# Patient Record
Sex: Female | Born: 1945 | Race: White | Hispanic: No | Marital: Married | Smoking: Former smoker
Health system: Southern US, Community
[De-identification: ages and names within clinical notes are randomized; demographics above are authoritative.]

## PROBLEM LIST (undated history)

## (undated) DIAGNOSIS — I739 Peripheral vascular disease, unspecified: Secondary | ICD-10-CM

## (undated) DIAGNOSIS — K635 Polyp of colon: Secondary | ICD-10-CM

## (undated) DIAGNOSIS — N816 Rectocele: Secondary | ICD-10-CM

## (undated) DIAGNOSIS — I700 Atherosclerosis of aorta: Secondary | ICD-10-CM

## (undated) DIAGNOSIS — M1990 Unspecified osteoarthritis, unspecified site: Secondary | ICD-10-CM

## (undated) DIAGNOSIS — G5763 Lesion of plantar nerve, bilateral lower limbs: Secondary | ICD-10-CM

## (undated) DIAGNOSIS — B029 Zoster without complications: Secondary | ICD-10-CM

## (undated) DIAGNOSIS — I10 Essential (primary) hypertension: Secondary | ICD-10-CM

## (undated) DIAGNOSIS — E039 Hypothyroidism, unspecified: Secondary | ICD-10-CM

## (undated) DIAGNOSIS — C439 Malignant melanoma of skin, unspecified: Secondary | ICD-10-CM

## (undated) DIAGNOSIS — F419 Anxiety disorder, unspecified: Secondary | ICD-10-CM

## (undated) DIAGNOSIS — F32A Depression, unspecified: Secondary | ICD-10-CM

## (undated) DIAGNOSIS — E785 Hyperlipidemia, unspecified: Secondary | ICD-10-CM

## (undated) DIAGNOSIS — G43909 Migraine, unspecified, not intractable, without status migrainosus: Secondary | ICD-10-CM

## (undated) DIAGNOSIS — R7302 Impaired glucose tolerance (oral): Secondary | ICD-10-CM

## (undated) DIAGNOSIS — E7800 Pure hypercholesterolemia, unspecified: Secondary | ICD-10-CM

## (undated) DIAGNOSIS — S72009A Fracture of unspecified part of neck of unspecified femur, initial encounter for closed fracture: Secondary | ICD-10-CM

## (undated) DIAGNOSIS — D126 Benign neoplasm of colon, unspecified: Secondary | ICD-10-CM

## (undated) DIAGNOSIS — I639 Cerebral infarction, unspecified: Secondary | ICD-10-CM

## (undated) HISTORY — PX: COLONOSCOPY: SHX174

## (undated) HISTORY — DX: Lesion of plantar nerve, bilateral lower limbs: G57.63

## (undated) HISTORY — DX: Rectocele: N81.6

## (undated) HISTORY — DX: Anxiety disorder, unspecified: F41.9

## (undated) HISTORY — PX: CATARACT EXTRACTION, BILATERAL: SHX1313

## (undated) HISTORY — DX: Migraine, unspecified, not intractable, without status migrainosus: G43.909

## (undated) HISTORY — PX: BREAST SURGERY: SHX581

## (undated) HISTORY — PX: BREAST BIOPSY: SHX20

## (undated) HISTORY — DX: Depression, unspecified: F32.A

## (undated) HISTORY — DX: Fracture of unspecified part of neck of unspecified femur, initial encounter for closed fracture: S72.009A

## (undated) HISTORY — DX: Polyp of colon: K63.5

## (undated) HISTORY — PX: PARTIAL HYSTERECTOMY: SHX80

## (undated) HISTORY — PX: ABDOMINAL HYSTERECTOMY: SHX81

## (undated) HISTORY — DX: Benign neoplasm of colon, unspecified: D12.6

## (undated) HISTORY — DX: Hyperlipidemia, unspecified: E78.5

## (undated) HISTORY — DX: Unspecified osteoarthritis, unspecified site: M19.90

## (undated) HISTORY — PX: REPLACEMENT TOTAL KNEE: SUR1224

## (undated) HISTORY — DX: Atherosclerosis of aorta: I70.0

## (undated) HISTORY — DX: Zoster without complications: B02.9

## (undated) HISTORY — DX: Impaired glucose tolerance (oral): R73.02

## (undated) HISTORY — DX: Malignant melanoma of skin, unspecified: C43.9

## (undated) HISTORY — PX: OTHER SURGICAL HISTORY: SHX169

## (undated) HISTORY — PX: THYROIDECTOMY: SHX17

## (undated) HISTORY — DX: Peripheral vascular disease, unspecified: I73.9

## (undated) HISTORY — DX: Hypothyroidism, unspecified: E03.9

---

## 2004-02-05 DIAGNOSIS — I639 Cerebral infarction, unspecified: Secondary | ICD-10-CM

## 2004-02-05 HISTORY — DX: Cerebral infarction, unspecified: I63.9

## 2005-09-04 DIAGNOSIS — I639 Cerebral infarction, unspecified: Secondary | ICD-10-CM | POA: Insufficient documentation

## 2011-11-19 DIAGNOSIS — M1710 Unilateral primary osteoarthritis, unspecified knee: Secondary | ICD-10-CM | POA: Insufficient documentation

## 2011-11-19 DIAGNOSIS — E782 Mixed hyperlipidemia: Secondary | ICD-10-CM | POA: Insufficient documentation

## 2011-11-19 DIAGNOSIS — I639 Cerebral infarction, unspecified: Secondary | ICD-10-CM | POA: Insufficient documentation

## 2011-11-19 DIAGNOSIS — E890 Postprocedural hypothyroidism: Secondary | ICD-10-CM | POA: Insufficient documentation

## 2012-01-13 DIAGNOSIS — Z833 Family history of diabetes mellitus: Secondary | ICD-10-CM | POA: Insufficient documentation

## 2012-01-13 DIAGNOSIS — G609 Hereditary and idiopathic neuropathy, unspecified: Secondary | ICD-10-CM | POA: Insufficient documentation

## 2012-03-27 DIAGNOSIS — M5430 Sciatica, unspecified side: Secondary | ICD-10-CM | POA: Insufficient documentation

## 2013-12-29 DIAGNOSIS — Z8673 Personal history of transient ischemic attack (TIA), and cerebral infarction without residual deficits: Secondary | ICD-10-CM | POA: Insufficient documentation

## 2013-12-29 DIAGNOSIS — R7301 Impaired fasting glucose: Secondary | ICD-10-CM | POA: Insufficient documentation

## 2014-05-18 DIAGNOSIS — N816 Rectocele: Secondary | ICD-10-CM | POA: Insufficient documentation

## 2014-05-18 DIAGNOSIS — N952 Postmenopausal atrophic vaginitis: Secondary | ICD-10-CM | POA: Insufficient documentation

## 2015-11-22 ENCOUNTER — Encounter: Payer: Self-pay | Admitting: Cardiovascular Disease

## 2016-01-31 DIAGNOSIS — R202 Paresthesia of skin: Secondary | ICD-10-CM | POA: Insufficient documentation

## 2016-01-31 DIAGNOSIS — G5600 Carpal tunnel syndrome, unspecified upper limb: Secondary | ICD-10-CM | POA: Insufficient documentation

## 2016-11-02 ENCOUNTER — Emergency Department
Admission: EM | Admit: 2016-11-02 | Discharge: 2016-11-02 | Disposition: A | Discharge status: Home / Self Care | Payer: Medicare Other | Attending: Emergency Medicine | Admitting: Emergency Medicine

## 2016-11-02 ENCOUNTER — Encounter: Payer: Self-pay | Admitting: Emergency Medicine

## 2016-11-02 DIAGNOSIS — S61210A Laceration without foreign body of right index finger without damage to nail, initial encounter: Secondary | ICD-10-CM | POA: Diagnosis not present

## 2016-11-02 DIAGNOSIS — Y92019 Unspecified place in single-family (private) house as the place of occurrence of the external cause: Secondary | ICD-10-CM | POA: Insufficient documentation

## 2016-11-02 DIAGNOSIS — Y999 Unspecified external cause status: Secondary | ICD-10-CM | POA: Diagnosis not present

## 2016-11-02 DIAGNOSIS — S61212A Laceration without foreign body of right middle finger without damage to nail, initial encounter: Secondary | ICD-10-CM

## 2016-11-02 DIAGNOSIS — S61214A Laceration without foreign body of right ring finger without damage to nail, initial encounter: Secondary | ICD-10-CM | POA: Diagnosis not present

## 2016-11-02 DIAGNOSIS — Z7902 Long term (current) use of antithrombotics/antiplatelets: Secondary | ICD-10-CM | POA: Insufficient documentation

## 2016-11-02 DIAGNOSIS — Y9389 Activity, other specified: Secondary | ICD-10-CM | POA: Diagnosis not present

## 2016-11-02 DIAGNOSIS — I10 Essential (primary) hypertension: Secondary | ICD-10-CM | POA: Diagnosis not present

## 2016-11-02 DIAGNOSIS — S61222A Laceration with foreign body of right middle finger without damage to nail, initial encounter: Secondary | ICD-10-CM | POA: Diagnosis not present

## 2016-11-02 DIAGNOSIS — W25XXXA Contact with sharp glass, initial encounter: Secondary | ICD-10-CM | POA: Diagnosis not present

## 2016-11-02 DIAGNOSIS — Z8673 Personal history of transient ischemic attack (TIA), and cerebral infarction without residual deficits: Secondary | ICD-10-CM | POA: Insufficient documentation

## 2016-11-02 HISTORY — DX: Cerebral infarction, unspecified: I63.9

## 2016-11-02 HISTORY — DX: Essential (primary) hypertension: I10

## 2016-11-02 HISTORY — DX: Pure hypercholesterolemia, unspecified: E78.00

## 2016-11-02 MED ORDER — LIDOCAINE HCL (PF) 1 % IJ SOLN
5.0000 mL | Freq: Once | INTRAMUSCULAR | Status: AC
  Administered 2016-11-02: 5 mL

## 2016-11-02 MED ORDER — LIDOCAINE HCL (PF) 1 % IJ SOLN
INTRAMUSCULAR | Status: AC
  Administered 2016-11-02: 5 mL
  Filled 2016-11-02: qty 5

## 2016-11-02 NOTE — ED Provider Notes (Signed)
[facility] Emergency Department Provider Note ____________________________________________  Time seen: 2103  I have reviewed the triage vital signs and the nursing notes.  HISTORY  Chief Complaint  Laceration  HPI Rachel Donovan is a 71 y.o. female Presents to the ED, coming by her husband, for evaluation of lacerations to her right fingers on a wine glass at home. Patient describes she was putting a glass down on the counter, when apparently tipped over and she reached forward. It created lacerations over her middle finger, ring finger, and pinky finger of the right hand. She denies any other injury at this time. She's had a difficult time controlling the bleeding as she takes Aggrenox daily.  Past Medical History:  Diagnosis Date  . CVA (cerebral vascular accident) ([place])   . High cholesterol   . Hypertension     There are no active problems to display for this patient.   History reviewed. No pertinent surgical history.  Prior to Admission medications   Medication Sig Start Date End Date Taking? Authorizing Provider  dipyridamole-aspirin (AGGRENOX) 200-25 MG 12hr capsule Take 1 capsule by mouth 2 (two) times daily.   Yes [provider]  levothyroxine (SYNTHROID, LEVOTHROID) 112 MCG tablet Take 112 mcg by mouth daily before breakfast.   Yes [provider]    Allergies Codeine and Penicillins  History reviewed. No pertinent family history.  Social History Social History  Substance Use Topics  . Smoking status: Never Smoker  . Smokeless tobacco: Never Used  . Alcohol use Yes    Review of Systems  Constitutional: Negative for fever. Musculoskeletal: Negative for back pain. Skin: Negative for rash. Finger lacerations as above Neurological: Negative for headaches, focal weakness or numbness. ____________________________________________  PHYSICAL EXAM:  VITAL SIGNS: ED Triage Vitals  Enc Vitals Group     BP 11/02/16 1845  (!) 189/100     Pulse Rate 11/02/16 1845 68     Resp 11/02/16 1845 18     Temp 11/02/16 1845 98.8 F (37.1 C)     Temp Source 11/02/16 1845 Oral     SpO2 11/02/16 1845 96 %     Weight 11/02/16 1845 150 lb (68 kg)     Height 11/02/16 1845 5\' 2"  (1.575 m)     Head Circumference --      Peak Flow --      Pain Score 11/02/16 1903 0     Pain Loc --      Pain Edu? --      Excl. in [place]? --     Constitutional: Alert and oriented. Well appearing and in no distress. Head: Normocephalic and atraumatic. Cardiovascular: Normal distal pulses. Respiratory: Normal respiratory effort.  Musculoskeletal: Normal composite fists. Nontender with normal range of motion in all extremities.  Neurologic:  Normal gross sensation. Normal speech and language. No gross focal neurologic deficits are appreciated. Skin:  Skin is warm, dry and intact. No rash noted. Right middle & ring fingers with small, superficial laceration over the middle phalanx. The pinky finger with a oozing laceration over the distal fat pad.  ____________________________________________  PROCEDURES  LACERATION REPAIR Performed by: Melvenia Needles Authorized by: Melvenia Needles Consent: Verbal consent obtained. Risks and benefits: risks, benefits and alternatives were discussed Consent given by: patient Patient identity confirmed: provided demographic data Prepped and Draped in normal sterile fashion Wound explored  Laceration Location: right middle, ring, pinky fingers  Laceration Length: 0.3 cm, 0.5 cm, 1 cm, respectively  No  Foreign Bodies seen or palpated  Anesthesia: local infiltration  Local anesthetic: lidocaine 1% w/o epinephrine  Anesthetic total: 4 ml  Irrigation method: syringe Amount of cleaning: standard  Skin closure: 5-0 nylon   Number of sutures: 8 total (#1,# 2, # 5, respectively)  Technique: interrupted  Patient tolerance: Patient tolerated the procedure well with no immediate  complications. ____________________________________________  INITIAL IMPRESSION / ASSESSMENT AND PLAN / ED COURSE  Patient was eating evaluation and laceration repair of multiple lacerations over the fingers of the right hand. The wounds are appropriately prepped and draped and closed using nylon sutures. The patient was procedure well and good wound approximation is achieved. She is discharged with wound care instructions and will follow-up with a local urgent care for suture removal in 7-10 days. ____________________________________________  FINAL CLINICAL IMPRESSION(S) / ED DIAGNOSES  Final diagnoses:  Laceration of right index finger without foreign body without damage to nail, initial encounter  Laceration of right middle finger without foreign body without damage to nail, initial encounter  Laceration of right ring finger without foreign body without damage to nail, initial encounter      Melvenia Needles, PA-C 11/02/16 2321    Nena Polio, MD 11/02/16 2322

## 2016-11-02 NOTE — ED Notes (Signed)
It is difficult to visualize lacerations due to clotted dressing over lacerations. Pt states she is on aggrenox, will leave dressing in place until provider is ready to assess due to increased bleeding risk.

## 2016-11-02 NOTE — ED Triage Notes (Signed)
Pt to ed with c/o laceration to right hand third, fourth, and fifth digit after breaking a wine glass. Pt is on aggrenox.

## 2016-11-02 NOTE — Discharge Instructions (Signed)
Keep the wounds clean, dry, and covered. Follow-up with one of the local urgent care centers for suture removal in 7-10 days.

## 2016-11-02 NOTE — ED Notes (Signed)

## 2016-12-03 DIAGNOSIS — R7309 Other abnormal glucose: Secondary | ICD-10-CM | POA: Insufficient documentation

## 2016-12-03 DIAGNOSIS — G43909 Migraine, unspecified, not intractable, without status migrainosus: Secondary | ICD-10-CM | POA: Insufficient documentation

## 2016-12-03 DIAGNOSIS — J45909 Unspecified asthma, uncomplicated: Secondary | ICD-10-CM | POA: Insufficient documentation

## 2017-01-21 DIAGNOSIS — J209 Acute bronchitis, unspecified: Secondary | ICD-10-CM | POA: Insufficient documentation

## 2017-04-09 DIAGNOSIS — Z0000 Encounter for general adult medical examination without abnormal findings: Secondary | ICD-10-CM | POA: Insufficient documentation

## 2017-04-16 DIAGNOSIS — H6120 Impacted cerumen, unspecified ear: Secondary | ICD-10-CM | POA: Insufficient documentation

## 2017-04-16 DIAGNOSIS — C4360 Malignant melanoma of unspecified upper limb, including shoulder: Secondary | ICD-10-CM | POA: Insufficient documentation

## 2017-04-23 ENCOUNTER — Telehealth: Payer: Self-pay | Admitting: Obstetrics & Gynecology

## 2017-04-23 NOTE — Telephone Encounter (Signed)
Called and left a message for patient to call back to schedule a new patient doctor referral appointment with our office for an AEX with Dr. Sabra Heck.

## 2017-05-13 DIAGNOSIS — H6123 Impacted cerumen, bilateral: Secondary | ICD-10-CM | POA: Insufficient documentation

## 2017-05-19 ENCOUNTER — Encounter: Payer: Self-pay | Admitting: Obstetrics and Gynecology

## 2017-05-19 ENCOUNTER — Ambulatory Visit (INDEPENDENT_AMBULATORY_CARE_PROVIDER_SITE_OTHER): Payer: Medicare Other | Admitting: Obstetrics and Gynecology

## 2017-05-19 ENCOUNTER — Other Ambulatory Visit: Payer: Self-pay | Admitting: Obstetrics and Gynecology

## 2017-05-19 ENCOUNTER — Other Ambulatory Visit: Payer: Self-pay

## 2017-05-19 VITALS — BP 134/76 | HR 72 | Resp 16 | Ht 62.0 in | Wt 146.0 lb

## 2017-05-19 DIAGNOSIS — Z124 Encounter for screening for malignant neoplasm of cervix: Secondary | ICD-10-CM

## 2017-05-19 DIAGNOSIS — Z01419 Encounter for gynecological examination (general) (routine) without abnormal findings: Secondary | ICD-10-CM | POA: Diagnosis not present

## 2017-05-19 DIAGNOSIS — Z13820 Encounter for screening for osteoporosis: Secondary | ICD-10-CM | POA: Diagnosis not present

## 2017-05-19 DIAGNOSIS — N952 Postmenopausal atrophic vaginitis: Secondary | ICD-10-CM | POA: Diagnosis not present

## 2017-05-19 DIAGNOSIS — E2839 Other primary ovarian failure: Secondary | ICD-10-CM | POA: Diagnosis not present

## 2017-05-19 DIAGNOSIS — N816 Rectocele: Secondary | ICD-10-CM

## 2017-05-19 DIAGNOSIS — Z1231 Encounter for screening mammogram for malignant neoplasm of breast: Secondary | ICD-10-CM

## 2017-05-19 NOTE — Patient Instructions (Signed)
EXERCISE AND DIET:  We recommended that you start or continue a regular exercise program for good health. Regular exercise means any activity that makes your heart beat faster and makes you sweat.  We recommend exercising at least 30 minutes per day at least 3 days a week, preferably 4 or 5.  We also recommend a diet low in fat and sugar.  Inactivity, poor dietary choices and obesity can cause diabetes, heart attack, stroke, and kidney damage, among others.    ALCOHOL AND SMOKING:  Women should limit their alcohol intake to no more than 7 drinks/beers/glasses of wine (combined, not each!) per week. Moderation of alcohol intake to this level decreases your risk of breast cancer and liver damage. And of course, no recreational drugs are part of a healthy lifestyle.  And absolutely no smoking or even second hand smoke. Most people know smoking can cause heart and lung diseases, but did you know it also contributes to weakening of your bones? Aging of your skin?  Yellowing of your teeth and nails?  CALCIUM AND VITAMIN D:  Adequate intake of calcium and Vitamin D are recommended.  The recommendations for exact amounts of these supplements seem to change often, but generally speaking 600 mg of calcium (either carbonate or citrate) and 800 units of Vitamin D per day seems prudent. Certain women may benefit from higher intake of Vitamin D.  If you are among these women, your doctor will have told you during your visit.    PAP SMEARS:  Pap smears, to check for cervical cancer or precancers,  have traditionally been done yearly, although recent scientific advances have shown that most women can have pap smears less often.  However, every woman still should have a physical exam from her gynecologist every year. It will include a breast check, inspection of the vulva and vagina to check for abnormal growths or skin changes, a visual exam of the cervix, and then an exam to evaluate the size and shape of the uterus and  ovaries.  And after 72 years of age, a rectal exam is indicated to check for rectal cancers. We will also provide age appropriate advice regarding health maintenance, like when you should have certain vaccines, screening for sexually transmitted diseases, bone density testing, colonoscopy, mammograms, etc.   MAMMOGRAMS:  All women over 40 years old should have a yearly mammogram. Many facilities now offer a "3D" mammogram, which may cost around $50 extra out of pocket. If possible,  we recommend you accept the option to have the 3D mammogram performed.  It both reduces the number of women who will be called back for extra views which then turn out to be normal, and it is better than the routine mammogram at detecting truly abnormal areas.    COLONOSCOPY:  Colonoscopy to screen for colon cancer is recommended for all women at age 50.  We know, you hate the idea of the prep.  We agree, BUT, having colon cancer and not knowing it is worse!!  Colon cancer so often starts as a polyp that can be seen and removed at colonscopy, which can quite literally save your life!  And if your first colonoscopy is normal and you have no family history of colon cancer, most women don't have to have it again for 10 years.  Once every ten years, you can do something that may end up saving your life, right?  We will be happy to help you get it scheduled when you are ready.    Be sure to check your insurance coverage so you understand how much it will cost.  It may be covered as a preventative service at no cost, but you should check your particular policy.     About Rectocele  Overview  A rectocele is a type of hernia which causes different degrees of bulging of the rectal tissues into the vaginal wall.  You may even notice that it presses against the vaginal wall so much that some vaginal tissues droop outside of the opening of your vagina.  Causes of Rectocele  The most common cause is childbirth.  The muscles and ligaments  in the pelvis that hold up and support the female organs and vagina become stretched and weakened during labor and delivery.  The more babies you have, the more the support tissues are stretched and weakened.  Not everyone who has a baby will develop a rectocele.  Some women have stronger supporting tissue in the pelvis and may not have as much of a problem as others.  Women who have a Cesarean section usually do not get rectocele's unless they pushed a long time prior to the cesarean delivery.  Other conditions that can cause a rectocele include chronic constipation, a chronic cough, a lot of heavy lifting, and obesity.  Older women may have this problem because the loss of female hormones causes the vaginal tissue to become weaker.  Symptoms  There may not be any symptoms.  If you do have symptoms, they may include:  Pelvic pressure in the rectal area  Protrusion of the lower part of the vagina through the opening of the vagina  Constipation and trapping of the stool, making it difficult to have a bowel movement.  In severe cases, you may have to press on the lower part of your vagina to help push the stool out of you rectum.  This is called splinting to empty.  Diagnosing Rectocele  Your health care provider will ask about your symptoms and perform a pelvic exam.  S/he will ask you to bear down, pushing like you are having a bowel movement so as to see how far the lower part of the vagina protrudes into the vagina and possible outside of the vagina.  Your provider will also ask you to contract the muscles of your pelvis (like you are stopping the stream in the middle of urinating) to determine the strength of your pelvic muscles.  Your provider may also do a rectal exam.  Treatment Options  If you do not have any symptoms, no treatment may be necessary.  Other treatment options include:  Pelvic floor exercises: Contracting the muscles in your genital area may help strengthen your muscles and  support the organs.  Be sure to get proper exercise instruction from you physical therapist.  A pessary (removealbe pelvic support device) sometimes helps rectocele symptoms.  Surgery: Surgical repair may be necessary. In some cases the uterus may need to be taken out ( a hysterectomy) as well.  There are many types of surgery for pelvic support problems.  Look for physicians who specialize in repair procedures.  You can take care of yourself by:  Treating and preventing constipation  Avoiding heavy lifting, and lifting correctly (with your legs, not with you waist or back)  Treating a chronic cough or bronchitis  Not smoking  avoiding too much weight gain  Doing pelvic floor exercises   2007, [employer] Doc.33 Breast Self-Awareness Breast self-awareness means being familiar with how your breasts look  and feel. It involves checking your breasts regularly and reporting any changes to your health care provider. Practicing breast self-awareness is important. A change in your breasts can be a sign of a serious medical problem. Being familiar with how your breasts look and feel allows you to find any problems early, when treatment is more likely to be successful. All women should practice breast self-awareness, including women who have had breast implants. How to do a breast self-exam One way to learn what is normal for your breasts and whether your breasts are changing is to do a breast self-exam. To do a breast self-exam: Look for Changes  1. Remove all the clothing above your waist. 2. Stand in front of a mirror in a room with good lighting. 3. Put your hands on your hips. 4. Push your hands firmly downward. 5. Compare your breasts in the mirror. Look for differences between them (asymmetry), such as: ? Differences in shape. ? Differences in size. ? Puckers, dips, and bumps in one breast and not the other. 6. Look at each breast for changes in your skin, such  as: ? Redness. ? Scaly areas. 7. Look for changes in your nipples, such as: ? Discharge. ? Bleeding. ? Dimpling. ? Redness. ? A change in position. Feel for Changes  Carefully feel your breasts for lumps and changes. It is best to do this while lying on your back on the floor and again while sitting or standing in the shower or tub with soapy water on your skin. Feel each breast in the following way:  Place the arm on the side of the breast you are examining above your head.  Feel your breast with the other hand.  Start in the nipple area and make  inch (2 cm) overlapping circles to feel your breast. Use the pads of your three middle fingers to do this. Apply light pressure, then medium pressure, then firm pressure. The light pressure will allow you to feel the tissue closest to the skin. The medium pressure will allow you to feel the tissue that is a little deeper. The firm pressure will allow you to feel the tissue close to the ribs.  Continue the overlapping circles, moving downward over the breast until you feel your ribs below your breast.  Move one finger-width toward the center of the body. Continue to use the  inch (2 cm) overlapping circles to feel your breast as you move slowly up toward your collarbone.  Continue the up and down exam using all three pressures until you reach your armpit.  Write Down What You Find  Write down what is normal for each breast and any changes that you find. Keep a written record with breast changes or normal findings for each breast. By writing this information down, you do not need to depend only on memory for size, tenderness, or location. Write down where you are in your menstrual cycle, if you are still menstruating. If you are having trouble noticing differences in your breasts, do not get discouraged. With time you will become more familiar with the variations in your breasts and more comfortable with the exam. How often should I examine my  breasts? Examine your breasts every month. If you are breastfeeding, the best time to examine your breasts is after a feeding or after using a breast pump. If you menstruate, the best time to examine your breasts is 5-7 days after your period is over. During your period, your breasts are lumpier, and  it may be more difficult to notice changes. When should I see my health care provider? See your health care provider if you notice:  A change in shape or size of your breasts or nipples.  A change in the skin of your breast or nipples, such as a reddened or scaly area.  Unusual discharge from your nipples.  A lump or thick area that was not there before.  Pain in your breasts.  Anything that concerns you.  This information is not intended to replace advice given to you by your health care provider. Make sure you discuss any questions you have with your health care provider. Document Released: 01/21/2005 Document Revised: 06/29/2015 Document Reviewed: 12/11/2014 Elsevier Interactive Patient Education  [employer].

## 2017-05-19 NOTE — Progress Notes (Signed)
[name] here for annual exam.  H/O TVH. She has had issues with abnormal mammograms, has had 3 biopsies. Last biopsy was within the last year. She is due for her yearly Bilateral mammogram. No vaginal bleeding. Not sexually active. H/O rectocele, not bother her. Normal BM every day. No urinary c/o. She gets a UTI about one time a year.     No LMP recorded. Patient has had a hysterectomy.          Sexually active: No.  The current method of family planning is status post hysterectomy.    Exercising: Yes.    dancing  Smoker:  Former smoker   Health Maintenance: Pap:  2017 WNL  History of abnormal Pap:  no MMG:  03/2016-  Due now for screening Colonoscopy:  2017 polyps repeat in 5 yrs  BMD:  Unsure, thinks normal   TDaP:  01-10-17  Gardasil: N/A   reports that she quit smoking about 39 years ago. Her smoking use included cigarettes. She has a 6.00 pack-year smoking history. She has never used smokeless tobacco. She reports that she drinks about 3.6 oz of alcohol per week. She reports that she does not use drugs. She has 2-3 drinks, 2-3 days a week. She recently moved back to [place]. She has one son, 3 step-children, 6 grandchildren and 3 great-grandchildren.   Past Medical History:  Diagnosis Date  . Anxiety   . Arthritis   . CVA (cerebral vascular accident) ([place])   . High cholesterol   . Hypertension   . Hypothyroid    surgical  . Melanoma ([place])   . Rectocele   She has a h/o 2 mild strokes, only some mild numbness. She was on vaginal estrogen at the time.   Past Surgical History:  Procedure Laterality Date  . ABDOMINAL HYSTERECTOMY    . BREAST SURGERY     BX breast lumps X 3  . REPLACEMENT TOTAL KNEE Bilateral   . THYROIDECTOMY    H/O benign lump  Current Outpatient Medications  Medication Sig Dispense Refill  . atorvastatin (LIPITOR) 20 MG tablet atorvastatin 20 mg tablet    . B Complex-C-Folic Acid (VIRT-VITE PLUS) 5 MG TABS Virt-Vite Plus 5  mg tablet  TK 1 T PO  QD    . betamethasone dipropionate 0.05 % lotion betamethasone dipropionate 0.05 % lotn    . dipyridamole-aspirin (AGGRENOX) 200-25 MG 12hr capsule aspirin 25 mg-dipyridamole 200 mg capsule,ext.release 12 hr multiphase    . Folic Acid-Vit Q5-ZDG L87 (VIRT-GARD PO) Take by mouth.    . hydrochlorothiazide (HYDRODIURIL) 12.5 MG tablet TK 1 T PO D PRN    . levothyroxine (SYNTHROID, LEVOTHROID) 125 MCG tablet   2  . levothyroxine (SYNTHROID, LEVOTHROID) 137 MCG tablet TK 1 T PO EVERY MORNING BEFORE BREAKFAST ALTERNATING WITH 125MCG T  1  . LORazepam (ATIVAN) 1 MG tablet TK 1/2 T PO QHS PRN  1  . losartan (COZAAR) 100 MG tablet losartan 100 mg tablet    . metoprolol succinate (TOPROL-XL) 25 MG 24 hr tablet metoprolol succinate ER 25 mg tablet,extended release 24 hr    . PARoxetine (PAXIL) 20 MG tablet   1  . triamcinolone (NASACORT ALLERGY 24HR) 55 MCG/ACT AERO nasal inhaler Place 2 sprays into the nose daily.     No current facility-administered medications for this visit.     Family History  Problem Relation Age of Onset  . Stroke Mother   . Hypertension Mother   . Heart disease  Mother   . Arthritis Mother   . Kidney disease Mother   . Hypertension Father   . Heart disease Father   . Arthritis Father   . Hypertension Brother   . Heart attack Brother   . Heart disease Brother   . Arthritis Brother   . Thyroid cancer Son     Review of Systems  Constitutional: Negative.   HENT: Negative.   Eyes: Negative.   Respiratory: Negative.   Cardiovascular: Negative.   Gastrointestinal: Negative.   Endocrine: Negative.   Genitourinary: Negative.   Musculoskeletal: Negative.   Skin: Negative.   Allergic/Immunologic: Negative.   Neurological: Negative.   Psychiatric/Behavioral: Negative.     Exam:   BP 134/76 (BP Location: Right Arm, Patient Position: Sitting, Cuff Size: Normal)   Pulse 72   Resp 16   Ht 5\' 2"  (1.575 m)   Wt 146 lb (66.2 kg)   BMI 26.70  kg/m   Weight change: @WEIGHTCHANGE @ Height:   Height: 5\' 2"  (157.5 cm)  Ht Readings from Last 3 Encounters:  05/19/17 5\' 2"  (1.575 m)  11/02/16 5\' 2"  (1.575 m)    General appearance: alert, cooperative and appears stated age Head: Normocephalic, without obvious abnormality, atraumatic Neck: no adenopathy, supple, symmetrical, trachea midline and thyroid normal to inspection and palpation Lungs: clear to auscultation bilaterally Cardiovascular: regular rate and rhythm Breasts: normal appearance, no masses or tenderness Abdomen: soft, non-tender; non distended,  no masses,  no organomegaly Extremities: extremities normal, atraumatic, no cyanosis or edema Skin: Skin color, texture, turgor normal. No rashes or lesions Lymph nodes: Cervical, supraclavicular, and axillary nodes normal. No abnormal inguinal nodes palpated Neurologic: Grossly normal   Pelvic: External genitalia:  no lesions              Urethra:  normal appearing urethra with no masses, tenderness or lesions              Bartholins and Skenes: normal                 Vagina: atrophic appearing vagina with normal color, no discharge, no lesions. Grade one rectocele (only examined supine, with and without valsalva)              Cervix: absent               Bimanual Exam:  Uterus:  uterus absent              Adnexa: no mass, fullness, tenderness               Rectovaginal: Confirms               Anus:  normal sphincter tone, no lesions  Chaperone was present for exam.  A:  Well Woman with normal exam  Vaginal atrophy  Small asymptomatic rectocele  P:   No pap needed  Mammogram due  Colonoscopy UTD  Discussed breast self exam  Discussed calcium and vit D intake  DEXA

## 2017-06-27 ENCOUNTER — Other Ambulatory Visit: Payer: Self-pay | Admitting: Obstetrics and Gynecology

## 2017-06-27 ENCOUNTER — Ambulatory Visit
Admission: RE | Admit: 2017-06-27 | Discharge: 2017-06-27 | Disposition: A | Discharge status: Home / Self Care | Payer: Medicare Other | Source: Ambulatory Visit | Attending: Obstetrics and Gynecology | Admitting: Obstetrics and Gynecology

## 2017-06-27 DIAGNOSIS — E2839 Other primary ovarian failure: Secondary | ICD-10-CM

## 2017-06-27 DIAGNOSIS — Z13820 Encounter for screening for osteoporosis: Secondary | ICD-10-CM

## 2017-06-27 DIAGNOSIS — Z1231 Encounter for screening mammogram for malignant neoplasm of breast: Secondary | ICD-10-CM

## 2017-07-01 ENCOUNTER — Other Ambulatory Visit: Payer: Self-pay | Admitting: Obstetrics and Gynecology

## 2017-07-01 DIAGNOSIS — R921 Mammographic calcification found on diagnostic imaging of breast: Secondary | ICD-10-CM

## 2017-07-02 ENCOUNTER — Ambulatory Visit
Admission: RE | Admit: 2017-07-02 | Discharge: 2017-07-02 | Disposition: A | Discharge status: Home / Self Care | Payer: Medicare Other | Source: Ambulatory Visit | Attending: Obstetrics and Gynecology | Admitting: Obstetrics and Gynecology

## 2017-07-02 DIAGNOSIS — R921 Mammographic calcification found on diagnostic imaging of breast: Secondary | ICD-10-CM

## 2017-07-04 ENCOUNTER — Telehealth: Payer: Self-pay | Admitting: *Deleted

## 2017-07-04 NOTE — Telephone Encounter (Signed)
-----   Message from Salvadore Dom, MD sent at 07/03/2017  5:17 PM EDT ----- Please let the patient know that she has osteopenia, not to the point of needing treatment. She should be getting 1,200 mg of calcium a day, at least 800 IU of vit d (she should have her primary check her vit d level) and should exercise regularly. She needs another DEXA in 2 years.

## 2017-07-04 NOTE — Telephone Encounter (Signed)
Patient returned call

## 2017-07-04 NOTE — Telephone Encounter (Signed)
Left message to call regarding results -eh 

## 2017-07-04 NOTE — Telephone Encounter (Signed)
Spoke with patient and gave results and recommendations. Patient voiced understanding -eh

## 2017-07-10 ENCOUNTER — Other Ambulatory Visit: Payer: Self-pay

## 2017-07-10 DIAGNOSIS — N649 Disorder of breast, unspecified: Secondary | ICD-10-CM

## 2017-09-15 ENCOUNTER — Telehealth: Payer: Self-pay | Admitting: Obstetrics and Gynecology

## 2017-09-15 NOTE — Telephone Encounter (Signed)
Pt returned call and appointment made for tomorrow 09/16/17 at 1600 for rectocele recheck. Pt feels constipation is getting worse and wondering if rectocele is causing this. No fevers or pain. Able to pass bowel movements at this time.  Office visit scheduled for tomorrow, encounter closed.

## 2017-09-15 NOTE — Telephone Encounter (Signed)
Message left to return call to [place] at [phone].  Dr. Talbert Nan pt with grade 1 rectocele.

## 2017-09-15 NOTE — Telephone Encounter (Signed)
Patient has a rectocele she would like to have check.

## 2017-09-16 ENCOUNTER — Telehealth: Payer: Self-pay

## 2017-09-16 ENCOUNTER — Ambulatory Visit: Payer: Self-pay | Admitting: Obstetrics and Gynecology

## 2017-09-16 NOTE — Telephone Encounter (Signed)
Appointment rescheduled to 09/17/2017 at 8:30 am with Dr.Jertson. Patient is agreeable to date and time. Encounter closed.

## 2017-09-16 NOTE — Progress Notes (Signed)
GYNECOLOGY  VISIT   HPI: 73 y.o.   Married  Caucasian  female   G1P1001 with No LMP recorded. Patient has had a hysterectomy.   here for follow up on rectocele. She has started to notice a bulge in her vagina. She c/o nocturia x 2-3, she is leaking a little bit of urine on the way to the bathroom only at night. This has been going on for several months, getting worse. She voids frequently during the day, but drinks lots of water, has 2 cups of coffee in the am. Voids normal amounts. No issues with day time voiding. When she voids at night she voids large amounts.  She has been having bowel issues since July 21, going from diarrhea to constipation. She is occasionally feeling like she needs to push on her vagina to have a BM. Feels worse since she has been having these GI issues. She also vomited during that time and strained really hard.  She started taking her husbands metamucil a few days ago and the last 2 days has had normal BM's and feels better currently. She does notice the bulge, but it isn't bothering her. She doesn't drink ETOH every night, does drink up until bedtime (water).   GYNECOLOGIC HISTORY: No LMP recorded. Patient has had a hysterectomy. Contraception:Hysterectomy Menopausal hormone therapy: None        OB History    Gravida  1   Para  1   Term  1   Preterm  0   AB  0   Living  1     SAB  0   TAB  0   Ectopic  0   Multiple  0   Live Births  1              There are no active problems to display for this patient.   Past Medical History:  Diagnosis Date  . Anxiety   . Arthritis   . CVA (cerebral vascular accident) ([place])   . High cholesterol   . Hypertension   . Hypothyroid    surgical  . Melanoma ([place])   . Rectocele     Past Surgical History:  Procedure Laterality Date  . ABDOMINAL HYSTERECTOMY    . BREAST BIOPSY Right    X 2  . BREAST BIOPSY Left   . BREAST SURGERY     BX breast lumps X 3  . REPLACEMENT TOTAL KNEE Bilateral   .  THYROIDECTOMY      Current Outpatient Medications  Medication Sig Dispense Refill  . atorvastatin (LIPITOR) 20 MG tablet atorvastatin 20 mg tablet    . B Complex-C-Folic Acid (VIRT-VITE PLUS) 5 MG TABS Virt-Vite Plus 5 mg tablet  TK 1 T PO  QD    . betamethasone dipropionate 0.05 % lotion betamethasone dipropionate 0.05 % lotn    . dipyridamole-aspirin (AGGRENOX) 200-25 MG 12hr capsule aspirin 25 mg-dipyridamole 200 mg capsule,ext.release 12 hr multiphase    . Folic Acid-Vit J8-SNK N39 (VIRT-GARD PO) Take by mouth.    . hydrochlorothiazide (HYDRODIURIL) 12.5 MG tablet TK 1 T PO D PRN    . levothyroxine (SYNTHROID, LEVOTHROID) 125 MCG tablet   2  . levothyroxine (SYNTHROID, LEVOTHROID) 137 MCG tablet TK 1 T PO EVERY MORNING BEFORE BREAKFAST ALTERNATING WITH 125MCG T  1  . LORazepam (ATIVAN) 1 MG tablet TK 1/2 T PO QHS PRN  1  . losartan (COZAAR) 100 MG tablet losartan 100 mg tablet    . metoprolol succinate (  TOPROL-XL) 25 MG 24 hr tablet metoprolol succinate ER 25 mg tablet,extended release 24 hr    . PARoxetine (PAXIL) 20 MG tablet   1  . triamcinolone (NASACORT ALLERGY 24HR) 55 MCG/ACT AERO nasal inhaler Place 2 sprays into the nose daily.     No current facility-administered medications for this visit.      ALLERGIES: Codeine; Penicillins; and Sulfa antibiotics  Family History  Problem Relation Age of Onset  . Stroke Mother   . Hypertension Mother   . Heart disease Mother   . Arthritis Mother   . Kidney disease Mother   . Hypertension Father   . Heart disease Father   . Arthritis Father   . Hypertension Brother   . Heart attack Brother   . Heart disease Brother   . Arthritis Brother   . Thyroid cancer Son     Social History   Socioeconomic History  . Marital status: Married    Spouse name: Not on file  . Number of children: Not on file  . Years of education: Not on file  . Highest education level: Not on file  Occupational History  . Not on file  Social Needs  .  Financial resource strain: Not on file  . Food insecurity:    Worry: Not on file    Inability: Not on file  . Transportation needs:    Medical: Not on file    Non-medical: Not on file  Tobacco Use  . Smoking status: Former Smoker    Packs/day: 0.50    Years: 12.00    Pack years: 6.00    Types: Cigarettes    Last attempt to quit: 02/04/1978    Years since quitting: 39.6  . Smokeless tobacco: Never Used  Substance and Sexual Activity  . Alcohol use: Yes    Alcohol/week: 6.0 standard drinks    Types: 6 Standard drinks or equivalent per week  . Drug use: No  . Sexual activity: Not Currently    Partners: Male    Birth control/protection: Post-menopausal  Lifestyle  . Physical activity:    Days per week: Not on file    Minutes per session: Not on file  . Stress: Not on file  Relationships  . Social connections:    Talks on phone: Not on file    Gets together: Not on file    Attends religious service: Not on file    Active member of club or organization: Not on file    Attends meetings of clubs or organizations: Not on file    Relationship status: Not on file  . Intimate partner violence:    Fear of current or ex partner: Not on file    Emotionally abused: Not on file    Physically abused: Not on file    Forced sexual activity: Not on file  Other Topics Concern  . Not on file  Social History Narrative  . Not on file    Review of Systems  Constitutional: Negative.   HENT: Negative.   Eyes: Negative.   Respiratory: Negative.   Cardiovascular: Negative.   Gastrointestinal: Positive for constipation and diarrhea.  Genitourinary:       Loss of urine spontaneously Loss of urine with sneeze or cough Nocturia  Musculoskeletal: Negative.   Skin: Negative.   Neurological: Negative.   Endo/Heme/Allergies: Negative.   Psychiatric/Behavioral: Negative.     PHYSICAL EXAMINATION:    BP 138/74 (BP Location: Right Arm, Patient Position: Sitting)   Pulse 60  Wt 149 lb 3.2  oz (67.7 kg)   BMI 27.29 kg/m     General appearance: alert, cooperative and appears stated age  Pelvic: External genitalia:  no lesions              Urethra:  normal appearing urethra with no masses, tenderness or lesions              Bartholins and Skenes: normal                 Vagina: atrophic appearing vagina, small grade 2 rectocele (previously grade 1), no significant vault prolapse or cystocele              Cervix: absent              Bimanual Exam:  Uterus:  uterus absent              Adnexa: no mass, fullness, tenderness              Anus: no lesions  Chaperone was present for exam.  ASSESSMENT Rectocele Bowel issues, diarrhea to constipation (recent) Nocturia Urge incontinence in the last few months    PLAN Discussed rectocele, currently symptoms are tolerable ACOG handout on pelvic prolapse given Avoid straining or heavy lifting Discussed regular use of metamucil Will send urine for ua, c&s Avoid drinking for 2-3 hours prior to bedtime  Discussed the option of medication for OAB, reviewed side effects. She declines.    An After Visit Summary was printed and given to the patient.  ~25 minutes face to face time of which over 50% was spent in counseling.

## 2017-09-16 NOTE — Telephone Encounter (Signed)
Left message to call [facility] at [phone].  Need to reschedule patient's appointment today at 4 pm with Dr.Jertson. Please reschedule if I am unavailable.

## 2017-09-17 ENCOUNTER — Other Ambulatory Visit: Payer: Self-pay

## 2017-09-17 ENCOUNTER — Ambulatory Visit (INDEPENDENT_AMBULATORY_CARE_PROVIDER_SITE_OTHER): Payer: Medicare Other | Admitting: Obstetrics and Gynecology

## 2017-09-17 ENCOUNTER — Encounter: Payer: Self-pay | Admitting: Obstetrics and Gynecology

## 2017-09-17 VITALS — BP 138/74 | HR 60 | Wt 149.2 lb

## 2017-09-17 DIAGNOSIS — N3941 Urge incontinence: Secondary | ICD-10-CM | POA: Diagnosis not present

## 2017-09-17 DIAGNOSIS — N816 Rectocele: Secondary | ICD-10-CM

## 2017-09-17 DIAGNOSIS — R351 Nocturia: Secondary | ICD-10-CM

## 2017-09-17 LAB — POCT URINALYSIS DIPSTICK
BILIRUBIN UA: NEGATIVE
Blood, UA: NEGATIVE
GLUCOSE UA: NEGATIVE
KETONES UA: NEGATIVE
Nitrite, UA: NEGATIVE
Protein, UA: NEGATIVE
SPEC GRAV UA: 1.01 (ref 1.010–1.025)
Urobilinogen, UA: 0.2 E.U./dL
pH, UA: 5 (ref 5.0–8.0)

## 2017-09-17 NOTE — Addendum Note (Signed)
Addended by: Gwendlyn Deutscher on: 09/17/2017 10:43 AM   Modules accepted: Orders

## 2017-09-17 NOTE — Patient Instructions (Signed)

## 2017-09-18 LAB — URINALYSIS, MICROSCOPIC ONLY: CASTS: NONE SEEN /LPF

## 2017-09-18 LAB — URINE CULTURE

## 2018-05-27 ENCOUNTER — Ambulatory Visit: Payer: Medicare Other | Admitting: Obstetrics and Gynecology

## 2018-05-27 DIAGNOSIS — M899 Disorder of bone, unspecified: Secondary | ICD-10-CM | POA: Insufficient documentation

## 2018-05-27 DIAGNOSIS — N390 Urinary tract infection, site not specified: Secondary | ICD-10-CM | POA: Insufficient documentation

## 2018-07-21 ENCOUNTER — Other Ambulatory Visit: Payer: Self-pay

## 2018-07-22 NOTE — Progress Notes (Signed)
73 y.o. G75P1001 Married White or Caucasian Not Hispanic or Latino female here for annual exam.   H/O TVH. H/O small grade 2 rectocele. She notices the bulge is noticeable in the bathroom. Issues with constipation, she is taking metamucil which is helping.  She is on lasix for her legs, needing to void more often with the lasix. Her OAB symptoms are better overall. Nocturia 1-2 x a night, voiding normal amounts. Just occasional urge incontinence, small amounts.  Not sexually active.     No LMP recorded. Patient has had a hysterectomy.          Sexually active: No.  The current method of family planning is status post hysterectomy.    Exercising: Yes.    walking Smoker:  Former smoker  Health Maintenance: Pap:  2017 WNL  History of abnormal Pap:  no MMG:  5/29/219 Birads 2 benign, recommended Surgical consultation and excision for the pathology proven "radial scar like lesion" in the patient's medial right breast. Colonoscopy:  2017 polyps repeat in 5 yrs  BMD:  06/27/2017 Osteopenia, FRAX 11.5/2.3% TDaP:  01-10-17  Gardasil: N/A   reports that she quit smoking about 40 years ago. Her smoking use included cigarettes. She has a 6.00 pack-year smoking history. She has never used smokeless tobacco. She reports current alcohol use of about 6.0 standard drinks of alcohol per week. She reports that she does not use drugs. She has one son, 3 step-children, 6 grandchildren and 4 great-grandchildren.   Past Medical History:  Diagnosis Date  . Anxiety   . Arthritis   . CVA (cerebral vascular accident) ([place])   . High cholesterol   . Hypertension   . Hypothyroid    surgical  . Melanoma ([place])   . Rectocele   She has a h/o 2 mild strokes, only some mild numbness. She was on vaginal estrogen at the time.   Past Surgical History:  Procedure Laterality Date  . BREAST BIOPSY Right    X 2  . BREAST BIOPSY Left   . BREAST SURGERY     BX breast lumps X 3  . REPLACEMENT TOTAL KNEE Bilateral   .  THYROIDECTOMY    . TOTAL VAGINAL HYSTERECTOMY      Current Outpatient Medications  Medication Sig Dispense Refill  . atorvastatin (LIPITOR) 20 MG tablet atorvastatin 20 mg tablet    . B Complex-C-Folic Acid (VIRT-VITE PLUS) 5 MG TABS Virt-Vite Plus 5 mg tablet  TK 1 T PO  QD    . betamethasone dipropionate 0.05 % lotion betamethasone dipropionate 0.05 % lotn    . dipyridamole-aspirin (AGGRENOX) 200-25 MG 12hr capsule aspirin 25 mg-dipyridamole 200 mg capsule,ext.release 12 hr multiphase    . felodipine (PLENDIL) 5 MG 24 hr tablet     . Folic Acid-Vit A1-PFX T02 (VIRT-GARD PO) Take by mouth.    . furosemide (LASIX) 20 MG tablet     . hydrochlorothiazide (HYDRODIURIL) 12.5 MG tablet TK 1 T PO D PRN    . levothyroxine (SYNTHROID, LEVOTHROID) 125 MCG tablet   2  . levothyroxine (SYNTHROID, LEVOTHROID) 137 MCG tablet TK 1 T PO EVERY MORNING BEFORE BREAKFAST ALTERNATING WITH 125MCG T  1  . LORazepam (ATIVAN) 1 MG tablet TK 1/2 T PO QHS PRN  1  . losartan (COZAAR) 100 MG tablet losartan 100 mg tablet    . metoprolol succinate (TOPROL-XL) 25 MG 24 hr tablet metoprolol succinate ER 25 mg tablet,extended release 24 hr    . PARoxetine (PAXIL) 20 MG  tablet   1  . triamcinolone (NASACORT ALLERGY 24HR) 55 MCG/ACT AERO nasal inhaler Place 2 sprays into the nose daily.     No current facility-administered medications for this visit.     Family History  Problem Relation Age of Onset  . Stroke Mother   . Hypertension Mother   . Heart disease Mother   . Arthritis Mother   . Kidney disease Mother   . Hypertension Father   . Heart disease Father   . Arthritis Father   . Hypertension Brother   . Heart attack Brother   . Heart disease Brother   . Arthritis Brother   . Thyroid cancer Son     Review of Systems  Constitutional: Negative.   HENT: Negative.   Eyes: Negative.   Respiratory: Negative.   Cardiovascular: Negative.   Gastrointestinal: Negative.   Endocrine: Negative.    Genitourinary: Negative.   Allergic/Immunologic: Negative.   Neurological: Negative.   Hematological: Negative.   Psychiatric/Behavioral: Negative.     Exam:   BP 118/72 (BP Location: Right Arm, Patient Position: Sitting, Cuff Size: Normal)   Pulse 66   Temp 98.2 F (36.8 C) (Oral)   Resp 14   Ht 5' 2.5" (1.588 m)   Wt 150 lb 5.6 oz (68.2 kg)   BMI 27.06 kg/m   Weight change: @WEIGHTCHANGE @ Height:   Height: 5' 2.5" (158.8 cm)  Ht Readings from Last 3 Encounters:  07/23/18 5' 2.5" (1.588 m)  05/19/17 5\' 2"  (1.575 m)  11/02/16 5\' 2"  (1.575 m)    General appearance: alert, cooperative and appears stated age Head: Normocephalic, without obvious abnormality, atraumatic Neck: no adenopathy, supple, symmetrical, trachea midline and thyroid normal to inspection and palpation Lungs: clear to auscultation bilaterally Cardiovascular: regular rate and rhythm Breasts: normal appearance, no masses or tenderness Abdomen: soft, non-tender; non distended,  no masses,  no organomegaly Extremities: extremities normal, atraumatic, no cyanosis or edema Skin: Skin color, texture, turgor normal. No rashes or lesions Lymph nodes: Cervical, supraclavicular, and axillary nodes normal. No abnormal inguinal nodes palpated Neurologic: Grossly normal   Pelvic: External genitalia:  no lesions              Urethra:  normal appearing urethra with no masses, tenderness or lesions              Bartholins and Skenes: normal                 Vagina: atrophic appearing vagina with a small grade 2 rectocele with valsalva              Cervix: absent               Bimanual Exam:  Uterus:  uterus absent              Adnexa: no mass, fullness, tenderness               Rectovaginal: Confirms               Anus:  normal sphincter tone, no lesions  Chaperone was present for exam.  A:  Well Woman with normal exam  Osteopenia  Rectocele, small, not symptomatic. Avoid heavy lifting and straining  P:   No pap  needed  Discussed breast self exam  Discussed calcium and vit D intake  Labs with primary  Mammogram due, she will schedule  Colonoscopy UTD  DEXA next year

## 2018-07-23 ENCOUNTER — Encounter: Payer: Self-pay | Admitting: Obstetrics and Gynecology

## 2018-07-23 ENCOUNTER — Ambulatory Visit (INDEPENDENT_AMBULATORY_CARE_PROVIDER_SITE_OTHER): Payer: Medicare Other | Admitting: Obstetrics and Gynecology

## 2018-07-23 ENCOUNTER — Other Ambulatory Visit: Payer: Self-pay

## 2018-07-23 VITALS — BP 118/72 | HR 66 | Temp 98.2°F | Resp 14 | Ht 62.5 in | Wt 150.3 lb

## 2018-07-23 DIAGNOSIS — N816 Rectocele: Secondary | ICD-10-CM

## 2018-07-23 DIAGNOSIS — Z124 Encounter for screening for malignant neoplasm of cervix: Secondary | ICD-10-CM | POA: Diagnosis not present

## 2018-07-23 DIAGNOSIS — Z01419 Encounter for gynecological examination (general) (routine) without abnormal findings: Secondary | ICD-10-CM | POA: Diagnosis not present

## 2018-07-23 NOTE — Patient Instructions (Signed)
EXERCISE AND DIET:  We recommended that you start or continue a regular exercise program for good health. Regular exercise means any activity that makes your heart beat faster and makes you sweat.  We recommend exercising at least 30 minutes per day at least 3 days a week, preferably 4 or 5.  We also recommend a diet low in fat and sugar.  Inactivity, poor dietary choices and obesity can cause diabetes, heart attack, stroke, and kidney damage, among others.    ALCOHOL AND SMOKING:  Women should limit their alcohol intake to no more than 7 drinks/beers/glasses of wine (combined, not each!) per week. Moderation of alcohol intake to this level decreases your risk of breast cancer and liver damage. And of course, no recreational drugs are part of a healthy lifestyle.  And absolutely no smoking or even second hand smoke. Most people know smoking can cause heart and lung diseases, but did you know it also contributes to weakening of your bones? Aging of your skin?  Yellowing of your teeth and nails?  CALCIUM AND VITAMIN D:  Adequate intake of calcium and Vitamin D are recommended.  The recommendations for exact amounts of these supplements seem to change often, but generally speaking 1,200 mg of calcium (between diet and supplement) and 800 units of Vitamin D per day seems prudent. Certain women may benefit from higher intake of Vitamin D.  If you are among these women, your doctor will have told you during your visit.    PAP SMEARS:  Pap smears, to check for cervical cancer or precancers,  have traditionally been done yearly, although recent scientific advances have shown that most women can have pap smears less often.  However, every woman still should have a physical exam from her gynecologist every year. It will include a breast check, inspection of the vulva and vagina to check for abnormal growths or skin changes, a visual exam of the cervix, and then an exam to evaluate the size and shape of the uterus and  ovaries.  And after 73 years of age, a rectal exam is indicated to check for rectal cancers. We will also provide age appropriate advice regarding health maintenance, like when you should have certain vaccines, screening for sexually transmitted diseases, bone density testing, colonoscopy, mammograms, etc.   MAMMOGRAMS:  All women over 40 years old should have a yearly mammogram. Many facilities now offer a "3D" mammogram, which may cost around $50 extra out of pocket. If possible,  we recommend you accept the option to have the 3D mammogram performed.  It both reduces the number of women who will be called back for extra views which then turn out to be normal, and it is better than the routine mammogram at detecting truly abnormal areas.    COLON CANCER SCREENING: Now recommend starting at age 45. At this time colonoscopy is not covered for routine screening until 50. There are take home tests that can be done between 45-49.   COLONOSCOPY:  Colonoscopy to screen for colon cancer is recommended for all women at age 50.  We know, you hate the idea of the prep.  We agree, BUT, having colon cancer and not knowing it is worse!!  Colon cancer so often starts as a polyp that can be seen and removed at colonscopy, which can quite literally save your life!  And if your first colonoscopy is normal and you have no family history of colon cancer, most women don't have to have it again for   10 years.  Once every ten years, you can do something that may end up saving your life, right?  We will be happy to help you get it scheduled when you are ready.  Be sure to check your insurance coverage so you understand how much it will cost.  It may be covered as a preventative service at no cost, but you should check your particular policy.      Breast Self-Awareness Breast self-awareness means being familiar with how your breasts look and feel. It involves checking your breasts regularly and reporting any changes to your  health care provider. Practicing breast self-awareness is important. A change in your breasts can be a sign of a serious medical problem. Being familiar with how your breasts look and feel allows you to find any problems early, when treatment is more likely to be successful. All women should practice breast self-awareness, including women who have had breast implants. How to do a breast self-exam One way to learn what is normal for your breasts and whether your breasts are changing is to do a breast self-exam. To do a breast self-exam: Look for Changes  1. Remove all the clothing above your waist. 2. Stand in front of a mirror in a room with good lighting. 3. Put your hands on your hips. 4. Push your hands firmly downward. 5. Compare your breasts in the mirror. Look for differences between them (asymmetry), such as: ? Differences in shape. ? Differences in size. ? Puckers, dips, and bumps in one breast and not the other. 6. Look at each breast for changes in your skin, such as: ? Redness. ? Scaly areas. 7. Look for changes in your nipples, such as: ? Discharge. ? Bleeding. ? Dimpling. ? Redness. ? A change in position. Feel for Changes Carefully feel your breasts for lumps and changes. It is best to do this while lying on your back on the floor and again while sitting or standing in the shower or tub with soapy water on your skin. Feel each breast in the following way:  Place the arm on the side of the breast you are examining above your head.  Feel your breast with the other hand.  Start in the nipple area and make  inch (2 cm) overlapping circles to feel your breast. Use the pads of your three middle fingers to do this. Apply light pressure, then medium pressure, then firm pressure. The light pressure will allow you to feel the tissue closest to the skin. The medium pressure will allow you to feel the tissue that is a little deeper. The firm pressure will allow you to feel the tissue  close to the ribs.  Continue the overlapping circles, moving downward over the breast until you feel your ribs below your breast.  Move one finger-width toward the center of the body. Continue to use the  inch (2 cm) overlapping circles to feel your breast as you move slowly up toward your collarbone.  Continue the up and down exam using all three pressures until you reach your armpit.  Write Down What You Find  Write down what is normal for each breast and any changes that you find. Keep a written record with breast changes or normal findings for each breast. By writing this information down, you do not need to depend only on memory for size, tenderness, or location. Write down where you are in your menstrual cycle, if you are still menstruating. If you are having trouble noticing differences   in your breasts, do not get discouraged. With time you will become more familiar with the variations in your breasts and more comfortable with the exam. How often should I examine my breasts? Examine your breasts every month. If you are breastfeeding, the best time to examine your breasts is after a feeding or after using a breast pump. If you menstruate, the best time to examine your breasts is 5-7 days after your period is over. During your period, your breasts are lumpier, and it may be more difficult to notice changes. When should I see my health care provider? See your health care provider if you notice:  A change in shape or size of your breasts or nipples.  A change in the skin of your breast or nipples, such as a reddened or scaly area.  Unusual discharge from your nipples.  A lump or thick area that was not there before.  Pain in your breasts.  Anything that concerns you.  

## 2018-07-27 ENCOUNTER — Other Ambulatory Visit: Payer: Self-pay | Admitting: Obstetrics and Gynecology

## 2018-07-27 DIAGNOSIS — Z1231 Encounter for screening mammogram for malignant neoplasm of breast: Secondary | ICD-10-CM

## 2018-09-10 ENCOUNTER — Ambulatory Visit
Admission: RE | Admit: 2018-09-10 | Discharge: 2018-09-10 | Disposition: A | Discharge status: Home / Self Care | Payer: Medicare Other | Source: Ambulatory Visit | Attending: Obstetrics and Gynecology | Admitting: Obstetrics and Gynecology

## 2018-09-10 ENCOUNTER — Other Ambulatory Visit: Payer: Self-pay

## 2018-09-10 DIAGNOSIS — Z1231 Encounter for screening mammogram for malignant neoplasm of breast: Secondary | ICD-10-CM

## 2018-11-15 ENCOUNTER — Encounter: Payer: Self-pay | Admitting: Cardiovascular Disease

## 2018-12-18 DIAGNOSIS — R0600 Dyspnea, unspecified: Secondary | ICD-10-CM | POA: Insufficient documentation

## 2019-01-11 ENCOUNTER — Telehealth: Payer: Self-pay | Admitting: Cardiovascular Disease

## 2019-01-11 ENCOUNTER — Other Ambulatory Visit: Payer: Self-pay | Admitting: *Deleted

## 2019-01-11 ENCOUNTER — Other Ambulatory Visit: Payer: Self-pay

## 2019-01-11 ENCOUNTER — Ambulatory Visit (INDEPENDENT_AMBULATORY_CARE_PROVIDER_SITE_OTHER): Payer: Medicare Other | Admitting: Cardiovascular Disease

## 2019-01-11 ENCOUNTER — Encounter: Payer: Self-pay | Admitting: Cardiovascular Disease

## 2019-01-11 VITALS — BP 132/72 | HR 55 | Temp 91.2°F | Ht 62.0 in | Wt 150.0 lb

## 2019-01-11 DIAGNOSIS — R0602 Shortness of breath: Secondary | ICD-10-CM

## 2019-01-11 DIAGNOSIS — R079 Chest pain, unspecified: Secondary | ICD-10-CM

## 2019-01-11 DIAGNOSIS — I10 Essential (primary) hypertension: Secondary | ICD-10-CM | POA: Diagnosis not present

## 2019-01-11 DIAGNOSIS — E782 Mixed hyperlipidemia: Secondary | ICD-10-CM | POA: Diagnosis not present

## 2019-01-11 NOTE — Progress Notes (Signed)
Cardiology Office Note  Date:  01/11/2019   ID:  Rachel, Donovan 1945/05/15, MRN [id number]  PCP:  Reynold Bowen, MD   Chief Complaint  Patient presents with  . other    Ref by Dr. Reynold Bowen for shortness of breath on exertion, HTN and hyperlipidemia. Meds reviewed by the pt. verbally. Pt. c/o chest discomfort that lasted several days off and on with feeling fatigue, LE edema and shortness of breath on exertion.     HPI:  [name] is a 73 year old woman with past medical history of Smoker, stopped when she was 30 No diabetes CVA in 2006, etiology unclear, "small vessel disease" Who presents by referral for symptoms of shortness of breath, leg swelling, fatigue  Reports having difficult year, some anxiety over stressors such as Covid Has not been very active, no regular exercise program Feels she is deconditioned  BP at home: Typically well controlled [id number] up to [id number] systolic [id number] systolic today With stressful situations, blood pressure does climb  "no energy", "I think it is covid" Some SOB, at top of stairs  Had some pressure on left side of chest when seen by PMD, one month ago None recently, even with exertion Again worries it could have been stress  Previous records reviewed Chemical stress test [employer] 5 years ago Was having shoulder pain at the time  Lab work reviewed Total chol 137, LDL 67  EKG personally reviewed by myself on todays visit Shows normal sinus rhythm with rate 55 bpm T wave abnormality 3 aVF   PMH:   has a past medical history of Anxiety, Arthritis, CVA (cerebral vascular accident) ([place]) (2006), High cholesterol, Hypertension, Hypothyroid, Melanoma ([place]), and Rectocele.  PSH:    Past Surgical History:  Procedure Laterality Date  . BREAST BIOPSY Right    X 2  . BREAST BIOPSY Left   . BREAST SURGERY     BX breast lumps X 3  . PARTIAL HYSTERECTOMY    . REPLACEMENT TOTAL KNEE Bilateral   . THYROIDECTOMY      Current Outpatient  Medications  Medication Sig Dispense Refill  . atorvastatin (LIPITOR) 20 MG tablet atorvastatin 20 mg tablet    . B Complex-C-Folic Acid (VIRT-VITE PLUS) 5 MG TABS Virt-Vite Plus 5 mg tablet  TK 1 T PO  QD    . betamethasone dipropionate 0.05 % lotion betamethasone dipropionate 0.05 % lotn    . dipyridamole-aspirin (AGGRENOX) 200-25 MG 12hr capsule Take 1 capsule by mouth 2 (two) times daily.     . felodipine (PLENDIL) 5 MG 24 hr tablet Take 5 mg by mouth daily.     . Folic Acid-Vit [id number] 123456 (VIRT-GARD PO) Take by mouth.    . furosemide (LASIX) 20 MG tablet Take 20 mg by mouth daily as needed.     . hydrochlorothiazide (HYDRODIURIL) 12.5 MG tablet TK 1 T PO D PRN    . levothyroxine (SYNTHROID, LEVOTHROID) 125 MCG tablet Take 125 mcg by mouth every other day.   2  . levothyroxine (SYNTHROID, LEVOTHROID) 137 MCG tablet TK 1 T PO EVERY MORNING BEFORE BREAKFAST ALTERNATING WITH 125MCG T  1  . LORazepam (ATIVAN) 1 MG tablet TK 1/2 T PO QHS PRN  1  . losartan (COZAAR) 100 MG tablet losartan 100 mg tablet    . metoprolol succinate (TOPROL-XL) 25 MG 24 hr tablet metoprolol succinate ER 25 mg tablet,extended release 24 hr    . PARoxetine (PAXIL) 20 MG tablet Take 20 mg by mouth daily.  1  . triamcinolone (NASACORT ALLERGY 24HR) 55 MCG/ACT AERO nasal inhaler Place 2 sprays into the nose daily.     No current facility-administered medications for this visit.      Allergies:   Codeine, Penicillins, and Sulfa antibiotics   Social History:  The patient  reports that she quit smoking about 40 years ago. Her smoking use included cigarettes. She has a 6.00 pack-year smoking history. She has never used smokeless tobacco. She reports current alcohol use of about 6.0 standard drinks of alcohol per week. She reports that she does not use drugs.   Family History:   family history includes Arthritis in her brother, father, and mother; Heart attack in her brother; Heart disease in her brother, father, and  mother; Hypertension in her brother, father, and mother; Kidney disease in her mother; Stroke in her mother; Thyroid cancer in her son.    Review of Systems: Review of Systems  Constitutional: Negative.   HENT: Negative.   Respiratory: Positive for shortness of breath.   Cardiovascular: Negative.   Gastrointestinal: Negative.   Musculoskeletal: Negative.   Neurological: Negative.   Psychiatric/Behavioral: The patient is nervous/anxious.   All other systems reviewed and are negative.    PHYSICAL EXAM: VS:  BP 132/72 (BP Location: Right Arm, Patient Position: Sitting, Cuff Size: Normal)   Pulse (!) 55   Temp (!) 91.2 F (32.9 C)   Ht 5\' 2"  (1.575 m)   Wt 150 lb (68 kg)   SpO2 98%   BMI 27.44 kg/m  , BMI Body mass index is 27.44 kg/m. GEN: Well nourished, well developed, in no acute distress HEENT: normal Neck: no JVD, carotid bruits, or masses Cardiac: RRR; no murmurs, rubs, or gallops,no edema  Respiratory:  clear to auscultation bilaterally, normal work of breathing GI: soft, nontender, nondistended, + BS MS: no deformity or atrophy Skin: warm and dry, no rash Neuro:  Strength and sensation are intact Psych: euthymic mood, full affect   Recent Labs: No results found for requested labs within last 8760 hours.    Lipid Panel No results found for: CHOL, HDL, LDLCALC, TRIG    Wt Readings from Last 3 Encounters:  01/11/19 150 lb (68 kg)  07/23/18 150 lb 5.6 oz (68.2 kg)  09/17/17 149 lb 3.2 oz (67.7 kg)       ASSESSMENT AND PLAN:  Problem List Items Addressed This Visit    None    Visit Diagnoses    Chest discomfort    -  Primary   Relevant Orders   EKG 12-Lead   Chest pain, unspecified type       Relevant Orders   CT CARDIAC SCORING   Shortness of breath       Relevant Orders   CT CARDIAC SCORING     Atypical chest pain Typically presenting at rest, not with exertion Few risk factors for coronary disease Recommended CT coronary calcium scoring  for risk stratification Few risk factors with well-controlled cholesterol, non-smoker, diabetes numbers well controlled  Hypertension In general well controlled No significant medication changes made, suggested she try to move one of her medications to the evening, two of them in the morning  Shortness of breath Conditioning likely an issue, recommended a regular walking program for conditioning  Lower extremity swelling Recommend compression hose Likely dependent edema, venous insufficiency  Anxiety Reassurance provided, recommend regular walking program  Disposition:   F/U as needed We will call her with the results of her calcium scoring  Total encounter time more than 45 minutes  Greater than 50% was spent in counseling and coordination of care with the patient   Signed, Esmond Plants, M.D., Ph.D. [place], [place]

## 2019-01-11 NOTE — Progress Notes (Signed)
Spoke with patient and reviewed her spouses information because I do not see that he has a chart in our system. Entered order with information she provided for him to have CT Cardiac Scoring test done with her when they go. She was appreciative for the call with no further questions at this time.

## 2019-01-11 NOTE — Patient Instructions (Addendum)
If shortness of breath gets worse, call the office We might need an echocardiogram   Start a walking program   Medication Instructions:  No changes  If you need a refill on your cardiac medications before your next appointment, please call your pharmacy.    Lab work: No new labs needed   If you have labs (blood work) drawn today and your tests are completely normal, you will receive your results only by: Marland Kitchen MyChart Message (if you have MyChart) OR . A paper copy in the mail If you have any lab test that is abnormal or we need to change your treatment, we will call you to review the results.   Testing/Procedures: We will order a CT coronary calcium score For chest pain, shortness of breath  We will order CT coronary calcium score $150    Please call [phone] to schedule   [facility] [address], [place] [address]    Follow-Up: At [facility], you and your health needs are our priority.  As part of our continuing mission to provide you with exceptional heart care, we have created designated Provider Care Teams.  These Care Teams include your primary Cardiologist (physician) and Advanced Practice Providers (APPs -  Physician Assistants and Nurse Practitioners) who all work together to provide you with the care you need, when you need it.  . You will need a follow up appointment as needed   . Providers on your designated Care Team:   . Murray Hodgkins, NP . Christell Faith, PA-C . Marrianne Mood, PA-C  Any Other Special Instructions Will Be Listed Below (If Applicable).  For educational health videos Log in to : [email or web address] Or : [email or web address], password : [email or web address]   Echocardiogram An echocardiogram is a procedure that uses painless sound waves (ultrasound) to produce an image of the heart. Images from an echocardiogram can provide important information about:  Signs of coronary artery disease (CAD).  Aneurysm detection. An aneurysm  is a weak or damaged part of an artery wall that bulges out from the normal force of blood pumping through the body.  Heart size and shape. Changes in the size or shape of the heart can be associated with certain conditions, including heart failure, aneurysm, and CAD.  Heart muscle function.  Heart valve function.  Signs of a past heart attack.  Fluid buildup around the heart.  Thickening of the heart muscle.  A tumor or infectious growth around the heart valves. Tell a health care provider about:  Any allergies you have.  All medicines you are taking, including vitamins, herbs, eye drops, creams, and over-the-counter medicines.  Any blood disorders you have.  Any surgeries you have had.  Any medical conditions you have.  Whether you are pregnant or may be pregnant. What are the risks? Generally, this is a safe procedure. However, problems may occur, including:  Allergic reaction to dye (contrast) that may be used during the procedure. What happens before the procedure? No specific preparation is needed. You may eat and drink normally. What happens during the procedure?   An IV tube may be inserted into one of your veins.  You may receive contrast through this tube. A contrast is an injection that improves the quality of the pictures from your heart.  A gel will be applied to your chest.  A wand-like tool (transducer) will be moved over your chest. The gel will help to transmit the sound waves from the transducer.  The sound  waves will harmlessly bounce off of your heart to allow the heart images to be captured in real-time motion. The images will be recorded on a computer. The procedure may vary among health care providers and hospitals. What happens after the procedure?  You may return to your normal, everyday life, including diet, activities, and medicines, unless your health care provider tells you not to do that. Summary  An echocardiogram is a procedure that  uses painless sound waves (ultrasound) to produce an image of the heart.  Images from an echocardiogram can provide important information about the size and shape of your heart, heart muscle function, heart valve function, and fluid buildup around your heart.  You do not need to do anything to prepare before this procedure. You may eat and drink normally.  After the echocardiogram is completed, you may return to your normal, everyday life, unless your health care provider tells you not to do that. This information is not intended to replace advice given to you by your health care provider. Make sure you discuss any questions you have with your health care provider. Document Released: 01/19/2000 Document Revised: 05/14/2018 Document Reviewed: 02/24/2016 Elsevier Patient Education  2020 [employer].

## 2019-01-11 NOTE — Telephone Encounter (Signed)
Patient states her husband would like to have the CT calcium score also. His name Chipper Herb DOB: 5.18.1945. please call.

## 2019-01-26 ENCOUNTER — Other Ambulatory Visit: Payer: Self-pay

## 2019-01-26 ENCOUNTER — Ambulatory Visit (INDEPENDENT_AMBULATORY_CARE_PROVIDER_SITE_OTHER)
Admission: RE | Admit: 2019-01-26 | Discharge: 2019-01-26 | Disposition: A | Discharge status: Home / Self Care | Payer: Self-pay | Source: Ambulatory Visit | Attending: Cardiovascular Disease | Admitting: Cardiovascular Disease

## 2019-01-26 DIAGNOSIS — R0602 Shortness of breath: Secondary | ICD-10-CM

## 2019-01-26 DIAGNOSIS — R079 Chest pain, unspecified: Secondary | ICD-10-CM

## 2019-03-07 ENCOUNTER — Ambulatory Visit: Payer: Medicare Other

## 2019-03-12 ENCOUNTER — Ambulatory Visit: Payer: Medicare Other | Attending: Internal Medicine

## 2019-03-12 DIAGNOSIS — Z23 Encounter for immunization: Secondary | ICD-10-CM | POA: Insufficient documentation

## 2019-03-12 NOTE — Progress Notes (Signed)
   Covid-19 Vaccination Clinic  Name:  Rachel Donovan    MRN: [id number] DOB: 1945-11-09  03/12/2019  Rachel Donovan was observed post Covid-19 immunization for 15 minutes without incidence. She was provided with Vaccine Information Sheet and instruction to access the V-Safe system.   Rachel Donovan was instructed to call 911 with any severe reactions post vaccine: Marland Kitchen Difficulty breathing  . Swelling of your face and throat  . A fast heartbeat  . A bad rash all over your body  . Dizziness and weakness    Immunizations Administered    Name Date Dose VIS Date Route   Pfizer COVID-19 Vaccine 03/12/2019 10:17 AM 0.3 mL 01/15/2019 Intramuscular   Manufacturer: [place]   Lot: [id number]   [place]: [id number]

## 2019-03-28 ENCOUNTER — Ambulatory Visit: Payer: Medicare Other

## 2019-04-04 ENCOUNTER — Ambulatory Visit: Payer: Medicare Other

## 2019-04-06 ENCOUNTER — Ambulatory Visit: Payer: Medicare Other | Attending: Internal Medicine

## 2019-04-06 DIAGNOSIS — Z23 Encounter for immunization: Secondary | ICD-10-CM | POA: Insufficient documentation

## 2019-04-06 NOTE — Progress Notes (Signed)
   Covid-19 Vaccination Clinic  Name:  Rachel Donovan    MRN: [id number] DOB: [birth year]  04/06/2019  Rachel Donovan was observed post Covid-19 immunization for 15 minutes without incident. She was provided with Vaccine Information Sheet and instruction to access the V-Safe system.   Rachel Donovan was instructed to call 911 with any severe reactions post vaccine: Marland Kitchen Difficulty breathing  . Swelling of face and throat  . A fast heartbeat  . A bad rash all over body  . Dizziness and weakness   Immunizations Administered    Name Date Dose VIS Date Route   Pfizer COVID-19 Vaccine 04/06/2019  9:28 AM 0.3 mL 01/15/2019 Intramuscular   Manufacturer: [place]   Lot: [id number]   [place]: [id number]

## 2019-05-24 DIAGNOSIS — D7589 Other specified diseases of blood and blood-forming organs: Secondary | ICD-10-CM | POA: Insufficient documentation

## 2019-05-31 DIAGNOSIS — I700 Atherosclerosis of aorta: Secondary | ICD-10-CM | POA: Insufficient documentation

## 2019-05-31 DIAGNOSIS — F329 Major depressive disorder, single episode, unspecified: Secondary | ICD-10-CM | POA: Insufficient documentation

## 2019-05-31 DIAGNOSIS — D126 Benign neoplasm of colon, unspecified: Secondary | ICD-10-CM | POA: Insufficient documentation

## 2019-06-09 ENCOUNTER — Other Ambulatory Visit: Payer: Self-pay

## 2019-06-09 ENCOUNTER — Encounter: Payer: Self-pay | Admitting: Podiatry

## 2019-06-09 ENCOUNTER — Ambulatory Visit (INDEPENDENT_AMBULATORY_CARE_PROVIDER_SITE_OTHER): Payer: Medicare Other

## 2019-06-09 ENCOUNTER — Ambulatory Visit (INDEPENDENT_AMBULATORY_CARE_PROVIDER_SITE_OTHER): Payer: Medicare Other | Admitting: Podiatry

## 2019-06-09 VITALS — Temp 98.1°F

## 2019-06-09 DIAGNOSIS — M2042 Other hammer toe(s) (acquired), left foot: Secondary | ICD-10-CM

## 2019-06-09 DIAGNOSIS — M2041 Other hammer toe(s) (acquired), right foot: Secondary | ICD-10-CM

## 2019-06-09 DIAGNOSIS — G5762 Lesion of plantar nerve, left lower limb: Secondary | ICD-10-CM | POA: Diagnosis not present

## 2019-06-09 DIAGNOSIS — I10 Essential (primary) hypertension: Secondary | ICD-10-CM | POA: Insufficient documentation

## 2019-06-09 DIAGNOSIS — F419 Anxiety disorder, unspecified: Secondary | ICD-10-CM | POA: Insufficient documentation

## 2019-06-09 DIAGNOSIS — G5761 Lesion of plantar nerve, right lower limb: Secondary | ICD-10-CM | POA: Diagnosis not present

## 2019-06-09 DIAGNOSIS — G5781 Other specified mononeuropathies of right lower limb: Secondary | ICD-10-CM

## 2019-06-09 DIAGNOSIS — G5782 Other specified mononeuropathies of left lower limb: Secondary | ICD-10-CM

## 2019-06-09 DIAGNOSIS — M25559 Pain in unspecified hip: Secondary | ICD-10-CM | POA: Insufficient documentation

## 2019-06-09 NOTE — Progress Notes (Signed)
Subjective:  Patient ID: Rachel Donovan, female    DOB: 08/19/1945,  MRN: [id number] HPI Chief Complaint  Patient presents with  . Hammer Toe    Patient presents today for hammertoes bilat and pain in tops of feet radiating down lateral side of feet.  she says it has been getting worse over the past 3 months and the pains come and go.  No treament has been done    74 y.o. female presents with the above complaint.   ROS: She denies fever chills nausea vomiting muscle aches pains calf pain back pain chest pain shortness of breath.  Past Medical History:  Diagnosis Date  . Anxiety   . Arthritis   . CVA (cerebral vascular accident) ([place]) 2006  . High cholesterol   . Hypertension   . Hypothyroid    surgical  . Melanoma ([place])   . Rectocele    Past Surgical History:  Procedure Laterality Date  . BREAST BIOPSY Right    X 2  . BREAST BIOPSY Left   . BREAST SURGERY     BX breast lumps X 3  . PARTIAL HYSTERECTOMY    . REPLACEMENT TOTAL KNEE Bilateral   . THYROIDECTOMY      Current Outpatient Medications:  .  fluticasone (FLONASE) 50 MCG/ACT nasal spray, Place into the nose., Disp: , Rfl:  .  atorvastatin (LIPITOR) 20 MG tablet, atorvastatin 20 mg tablet, Disp: , Rfl:  .  B Complex-C-Folic Acid (VIRT-VITE PLUS) 5 MG TABS, Virt-Vite Plus 5 mg tablet  TK 1 T PO  QD, Disp: , Rfl:  .  betamethasone dipropionate 0.05 % lotion, betamethasone dipropionate 0.05 % lotn, Disp: , Rfl:  .  dipyridamole-aspirin (AGGRENOX) 200-25 MG 12hr capsule, Take 1 capsule by mouth 2 (two) times daily. , Disp: , Rfl:  .  felodipine (PLENDIL) 5 MG 24 hr tablet, Take 5 mg by mouth daily. , Disp: , Rfl:  .  furosemide (LASIX) 20 MG tablet, Take 20 mg by mouth daily as needed. , Disp: , Rfl:  .  levothyroxine (SYNTHROID, LEVOTHROID) 125 MCG tablet, Take 125 mcg by mouth every other day. , Disp: , Rfl: 2 .  levothyroxine (SYNTHROID, LEVOTHROID) 137 MCG tablet, TK 1 T PO EVERY MORNING BEFORE BREAKFAST ALTERNATING  WITH 125MCG T, Disp: , Rfl: 1 .  LORazepam (ATIVAN) 1 MG tablet, TK 1/2 T PO QHS PRN, Disp: , Rfl: 1 .  losartan (COZAAR) 100 MG tablet, losartan 100 mg tablet, Disp: , Rfl:  .  metoprolol succinate (TOPROL-XL) 25 MG 24 hr tablet, metoprolol succinate ER 25 mg tablet,extended release 24 hr, Disp: , Rfl:  .  Multiple Vitamin (GNP ESSENTIAL ONE DAILY) TABS, Take by mouth., Disp: , Rfl:  .  mupirocin ointment (BACTROBAN) 2 %, APPLY TO BIOPSY SITE AS DIRECTED WITH DRESSING CHANGES, Disp: , Rfl:  .  PARoxetine (PAXIL) 20 MG tablet, Take 20 mg by mouth daily. , Disp: , Rfl: 1 .  triamcinolone (NASACORT ALLERGY 24HR) 55 MCG/ACT AERO nasal inhaler, Place 2 sprays into the nose daily., Disp: , Rfl:   Allergies  Allergen Reactions  . Codeine Hives  . Penicillins Hives  . Sulfa Antibiotics Hives   Review of Systems Objective:   Vitals:   06/09/19 1044  Temp: 98.1 F (36.7 C)    General: Well developed, nourished, in no acute distress, alert and oriented x3   Dermatological: Skin is warm, dry and supple bilateral. Nails x 10 are well maintained; remaining integument appears unremarkable at  this time. There are no open sores, no preulcerative lesions, no rash or signs of infection present.  Vascular: Dorsalis Pedis artery and Posterior Tibial artery pedal pulses are 2/4 bilateral with immedate capillary fill time. Pedal hair growth present. No varicosities and no lower extremity edema present bilateral.   Neruologic: Grossly intact via light touch bilateral. Vibratory intact via tuning fork bilateral. Protective threshold with Semmes Wienstein monofilament intact to all pedal sites bilateral. Patellar and Achilles deep tendon reflexes 2+ bilateral. No Babinski or clonus noted bilateral. Palpable Mulder's click third interdigital space bilateral.  Musculoskeletal: No gross boney pedal deformities bilateral. No pain, crepitus, or limitation noted with foot and ankle range of motion bilateral.  Muscular strength 5/5 in all groups tested bilateral. None flexible hammertoe deformities 2 3 and 4 on the left foot  Gait: Unassisted, Nonantalgic.    Radiographs:  Radiographs taken today primarily left foot demonstrates an osseously mature individual with the hammertoe deformities. Contralateral foot demonstrates very minimal changes. She also has a lateral dislocation at the third metatarsophalangeal joint left foot.  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 Neuromas third interdigital space bilateral.  Plan: Discussed etiology pathology conservative versus surgical therapies. At this time she would like to hold off on surgery. I did inject the neuromas third interdigital space bilateral. Follow-up with her as needed for that     Kavontae Pritchard T. [place], [place]

## 2019-07-29 ENCOUNTER — Telehealth: Payer: Self-pay

## 2019-07-29 ENCOUNTER — Other Ambulatory Visit: Payer: Self-pay | Admitting: Obstetrics and Gynecology

## 2019-07-29 DIAGNOSIS — Z1231 Encounter for screening mammogram for malignant neoplasm of breast: Secondary | ICD-10-CM

## 2019-07-29 NOTE — Telephone Encounter (Signed)
Spoke with pt. Pt needing to reschedule AEX that was cancelled due to schedule conflict. Pt rescheduled for AEX on 09/23/19 at 4 pm with Dr Talbert Nan. Pt agreeable and verbalized understanding of date and time of appt.   Routing to Dr Talbert Nan Encounter closed.

## 2019-07-29 NOTE — Telephone Encounter (Signed)
Patients AEX was canceled by our office. Offered patient (08/03/19) patient states cannot make it that date. Next available would be (12/16/19) patient wants to come in before then. Need triage to assist in scheduling.

## 2019-07-30 ENCOUNTER — Encounter: Payer: Self-pay | Admitting: Cardiovascular Disease

## 2019-07-30 DIAGNOSIS — I499 Cardiac arrhythmia, unspecified: Secondary | ICD-10-CM | POA: Insufficient documentation

## 2019-07-30 DIAGNOSIS — R252 Cramp and spasm: Secondary | ICD-10-CM | POA: Insufficient documentation

## 2019-07-30 DIAGNOSIS — R300 Dysuria: Secondary | ICD-10-CM | POA: Insufficient documentation

## 2019-08-03 ENCOUNTER — Ambulatory Visit (INDEPENDENT_AMBULATORY_CARE_PROVIDER_SITE_OTHER): Payer: Medicare Other | Admitting: Family

## 2019-08-03 ENCOUNTER — Other Ambulatory Visit: Payer: Self-pay

## 2019-08-03 ENCOUNTER — Encounter: Payer: Self-pay | Admitting: Family

## 2019-08-03 VITALS — BP 150/90 | HR 83 | Ht 62.0 in | Wt 144.4 lb

## 2019-08-03 DIAGNOSIS — F419 Anxiety disorder, unspecified: Secondary | ICD-10-CM | POA: Diagnosis not present

## 2019-08-03 DIAGNOSIS — I10 Essential (primary) hypertension: Secondary | ICD-10-CM

## 2019-08-03 DIAGNOSIS — I491 Atrial premature depolarization: Secondary | ICD-10-CM

## 2019-08-03 MED ORDER — METOPROLOL SUCCINATE ER 50 MG PO TB24: 50.0000 mg | ORAL_TABLET | Freq: Every day | ORAL | 3 refills | Status: DC

## 2019-08-03 NOTE — Patient Instructions (Addendum)
Medication Instructions:  Your physician has recommended you make the following change in your medication:   CHANGE Metoprolol Succinate to 50mg  daily  You may use up your current 25mg  tablets by taking two tablets together in the morning  *If you need a refill on your cardiac medications before your next appointment, please call your pharmacy*  Lab Work: No lab work today.   If you have labs (blood work) drawn today and your tests are completely normal, you will receive your results only by:  [facility] (if you have MyChart) OR  A paper copy in the mail If you have any lab test that is abnormal or we need to change your treatment, we will call you to review the results.   Testing/Procedures: Your EKG today showed normal sinus rhythm with PACs (premature atrial contractions) which are early beats. These are very common and not dangerous.   Follow-Up: At [facility], you and your health needs are our priority.  As part of our continuing mission to provide you with exceptional heart care, we have created designated Provider Care Teams.  These Care Teams include your primary Cardiologist (physician) and Advanced Practice Providers (APPs -  Physician Assistants and Nurse Practitioners) who all work together to provide you with the care you need, when you need it.  We recommend signing up for the patient portal called "MyChart".  Sign up information is provided on this After Visit Summary.  MyChart is used to connect with patients for Virtual Visits (Telemedicine).  Patients are able to view lab/test results, encounter notes, upcoming appointments, etc.  Non-urgent messages can be sent to your provider as well.   To learn more about what you can do with MyChart, go to [email or web address].    Your next appointment: In one month with Dr. Rockey Situ or APP  Other Instructions: Tips to Measure your Blood Pressure Correctly  To determine whether you have hypertension, a medical  professional will take a blood pressure reading. How you prepare for the test, the position of your arm, and other factors can change a blood pressure reading by 10% or more. That could be enough to hide high blood pressure, start you on a drug you don't really need, or lead your doctor to incorrectly adjust your medications.  National and international guidelines offer specific instructions for measuring blood pressure. If a doctor, nurse, or medical assistant isn't doing it right, don't hesitate to ask him or her to get with the guidelines.  Here's what you can do to ensure a correct reading:  Don't drink a caffeinated beverage or smoke during the 30 minutes before the test.  Sit quietly for five minutes before the test begins.  During the measurement, sit in a chair with your feet on the floor and your arm supported so your elbow is at about heart level.  The inflatable part of the cuff should completely cover at least 80% of your upper arm, and the cuff should be placed on bare skin, not over a shirt.  Don't talk during the measurement.  Have your blood pressure measured twice, with a brief break in between. If the readings are different by 5 points or more, have it done a third time.  There are times to break these rules. If you sometimes feel lightheaded when getting out of bed in the morning or when you stand after sitting, you should have your blood pressure checked while seated and then while standing to see if it falls from one position  to the next.  In 2017, new guidelines from the [place], the [employer] of Cardiology, and nine other health organizations lowered the diagnosis of high blood pressure to 130/80 mm Hg or higher for all adults. The guidelines also redefined the various blood pressure categories to now include normal, elevated, Stage 1 hypertension, Stage 2 hypertension, and hypertensive crisis (see "Blood pressure categories").  Blood pressure  categories  Blood pressure category SYSTOLIC (upper number)  DIASTOLIC (lower number)  Normal Less than 120 mm Hg and Less than 80 mm Hg  Elevated 120-129 mm Hg and Less than 80 mm Hg  High blood pressure: Stage 1 hypertension 130-139 mm Hg or 80-89 mm Hg  High blood pressure: Stage 2 hypertension 140 mm Hg or higher or 90 mm Hg or higher  Hypertensive crisis (consult your doctor immediately) Higher than 180 mm Hg and/or Higher than 120 mm Hg  Source: American Heart Association and American Stroke Association. For more on getting your blood pressure under control, buy Controlling Your Blood Pressure, a Special Health Report from [facility].   Blood Pressure Log   Date   Time  Blood Pressure  Position  Example: Nov 1 9 AM 124/78 sitting

## 2019-08-03 NOTE — Progress Notes (Signed)
Office Visit    Patient Name: Rachel Donovan Date of Encounter: 08/03/2019  Primary Care Provider:  Reynold Bowen, MD Primary Cardiologist:  Ida Rogue, MD Electrophysiologist:  None   Chief Complaint    Rachel Donovan is a 74 y.o. female with a hx of previous tobacco use, CVA of unclear etiology 2006, HTN, anxiety, hypothyroidism presents today for elevated BP.   Past Medical History    Past Medical History:  Diagnosis Date   Anxiety    Arthritis    CVA (cerebral vascular accident) ([place]) 2006   High cholesterol    Hypertension    Hypothyroid    surgical   Melanoma ([place])    Rectocele    Past Surgical History:  Procedure Laterality Date   BREAST BIOPSY Right    X 2   BREAST BIOPSY Left    BREAST SURGERY     BX breast lumps X 3   PARTIAL HYSTERECTOMY     REPLACEMENT TOTAL KNEE Bilateral    THYROIDECTOMY      Allergies  Allergies  Allergen Reactions   Codeine Hives   Penicillins Hives   Sulfa Antibiotics Hives    History of Present Illness    Rachel Donovan is a 74 y.o. female with a hx of previous tobacco use, CVA of unclear etiology 2006, HTN, anxiety, hypothyroidism last seen 01/21/19 by Dr. Rockey Situ.  Reports elevated blood pressure. She attributes this to anxiety. She was seen by her primary care provider and recommended ot start Effexor for anxiety and increase her Toprol to twice daily. He also discussed an "irregular heart rhythm" with her.   Reviewed her EKG with her which shows SR with PACs. She reports no palpitations. Does endorses feeling like her chest is "light" which she is unsure if this is her anxiety or the possible PACs.   Endorses low sodium diet.   Reports no shortness of breath nor dyspnea on exertion. Reports no chest pain, pressure, or tightness. No edema, orthopnea, PND. Reports no palpitations.   EKGs/Labs/Other Studies Reviewed:   The following studies were reviewed today:  EKG:  EKG is ordered today.  The ekg  ordered today demonstrates SR 83 bpm with PAC, left axis deviation, and criteria for LVH. No acute ST/T wave changes.   Recent Labs: No results found for requested labs within last 8760 hours.  Recent Lipid Panel No results found for: CHOL, TRIG, HDL, CHOLHDL, VLDL, LDLCALC, LDLDIRECT  Home Medications   Current Meds  Medication Sig   atorvastatin (LIPITOR) 20 MG tablet atorvastatin 20 mg tablet   B Complex-C-Folic Acid (VIRT-VITE PLUS) 5 MG TABS Virt-Vite Plus 5 mg tablet  TK 1 T PO  QD   betamethasone dipropionate 0.05 % lotion Apply topically as needed.    Calcium Carbonate-Vit D-Min (CALCIUM 1200 PO) Take by mouth daily.   dipyridamole-aspirin (AGGRENOX) 200-25 MG 12hr capsule Take 1 capsule by mouth 2 (two) times daily.    felodipine (PLENDIL) 5 MG 24 hr tablet Take 5 mg by mouth daily.    fluticasone (FLONASE) 50 MCG/ACT nasal spray Place into the nose.   furosemide (LASIX) 20 MG tablet Take 20 mg by mouth daily as needed.    levothyroxine (SYNTHROID, LEVOTHROID) 125 MCG tablet Take 125 mcg by mouth every other day.    levothyroxine (SYNTHROID, LEVOTHROID) 137 MCG tablet TK 1 T PO EVERY MORNING BEFORE BREAKFAST ALTERNATING WITH 125MCG T   LORazepam (ATIVAN) 1 MG tablet TK 1/2 T PO QHS PRN   losartan (  COZAAR) 100 MG tablet losartan 100 mg tablet   metoprolol succinate (TOPROL-XL) 50 MG 24 hr tablet Take 1 tablet (50 mg total) by mouth daily.   Multiple Vitamin (GNP ESSENTIAL ONE DAILY) TABS Take by mouth daily.    [DISCONTINUED] metoprolol succinate (TOPROL-XL) 25 MG 24 hr tablet metoprolol succinate ER 25 mg tablet,extended release 24 hr      Review of Systems       Review of Systems  Constitutional: Negative for chills, fever and malaise/fatigue.  Cardiovascular: Negative for chest pain, dyspnea on exertion, leg swelling, near-syncope, orthopnea, palpitations and syncope.       (+) elevated blood pressure  Respiratory: Negative for cough, shortness of breath  and wheezing.   Gastrointestinal: Negative for nausea and vomiting.  Neurological: Negative for dizziness, light-headedness and weakness.  Psychiatric/Behavioral: The patient is nervous/anxious.    All other systems reviewed and are otherwise negative except as noted above.  Physical Exam    VS:  BP (!) 150/90    Pulse 83    Ht 5\' 2"  (1.575 m)    Wt 144 lb 6 oz (65.5 kg)    SpO2 98%    BMI 26.41 kg/m  , BMI Body mass index is 26.41 kg/m. GEN: Well nourished, well developed, in no acute distress. HEENT: normal. Neck: Supple, no JVD, carotid bruits, or masses. Cardiac: RRR, no murmurs, rubs, or gallops. No clubbing, cyanosis, edema.  Radials/DP/PT 2+ and equal bilaterally.  Respiratory:  Respirations regular and unlabored, clear to auscultation bilaterally. GI: Soft, nontender, nondistended, BS + x 4. MS: No deformity or atrophy. Skin: Warm and dry, no rash. Neuro:  Strength and sensation are intact. Psych: Normal affect.  Assessment & Plan    1. HTN - Uncontrolled. Noted component of anxiety as her initial SBP was 180 in clinic and improved to 150 without intervention. Continue Losartan 100mg  daily. Increase Toprol to 50mg  daily as this may assist with her PACs as well as BP.  2. PAC - Noted on EKG today. Denies palpitations. Increase Metoprolol, as above. Did discuss possibility of ZIO monitor which she understandably prefers to defer until after she tries the increased Metoprolol. No lightheadedness, near-syncope suggestive of dangerous arrhythmia.  3. Anxiety - Continue to follow with PCP. Encouraged to start Effexor as recommended by PCP.   Disposition: Follow up in 1 month(s) with Dr. Rockey Situ or APP   Loel Dubonnet, NP 08/03/2019, 7:30 PM

## 2019-08-04 ENCOUNTER — Ambulatory Visit (HOSPITAL_COMMUNITY)
Admission: RE | Admit: 2019-08-04 | Discharge: 2019-08-04 | Disposition: A | Discharge status: Home / Self Care | Payer: Medicare Other | Source: Ambulatory Visit | Attending: Cardiovascular Disease | Admitting: Cardiovascular Disease

## 2019-08-04 ENCOUNTER — Other Ambulatory Visit (HOSPITAL_COMMUNITY): Payer: Self-pay | Admitting: Endocrinology

## 2019-08-04 DIAGNOSIS — R252 Cramp and spasm: Secondary | ICD-10-CM | POA: Diagnosis present

## 2019-08-05 ENCOUNTER — Ambulatory Visit: Payer: Medicare Other | Admitting: Obstetrics and Gynecology

## 2019-08-12 ENCOUNTER — Encounter: Payer: Self-pay | Admitting: Family

## 2019-08-27 DIAGNOSIS — I6930 Unspecified sequelae of cerebral infarction: Secondary | ICD-10-CM | POA: Insufficient documentation

## 2019-08-29 ENCOUNTER — Encounter: Payer: Self-pay | Admitting: Family

## 2019-09-02 ENCOUNTER — Ambulatory Visit: Payer: Medicare Other | Admitting: Family

## 2019-09-13 ENCOUNTER — Other Ambulatory Visit: Payer: Self-pay

## 2019-09-13 ENCOUNTER — Ambulatory Visit
Admission: RE | Admit: 2019-09-13 | Discharge: 2019-09-13 | Disposition: A | Discharge status: Home / Self Care | Payer: Medicare Other | Source: Ambulatory Visit | Attending: Obstetrics and Gynecology | Admitting: Obstetrics and Gynecology

## 2019-09-13 DIAGNOSIS — Z1231 Encounter for screening mammogram for malignant neoplasm of breast: Secondary | ICD-10-CM

## 2019-09-23 ENCOUNTER — Ambulatory Visit: Payer: Self-pay | Admitting: Obstetrics and Gynecology

## 2020-01-21 ENCOUNTER — Ambulatory Visit: Payer: Medicare Other

## 2020-02-23 ENCOUNTER — Ambulatory Visit: Payer: Medicare Other | Admitting: Podiatry

## 2020-04-03 ENCOUNTER — Other Ambulatory Visit: Payer: Self-pay

## 2020-04-03 ENCOUNTER — Ambulatory Visit (INDEPENDENT_AMBULATORY_CARE_PROVIDER_SITE_OTHER): Payer: Medicare Other | Admitting: Podiatry

## 2020-04-03 ENCOUNTER — Encounter: Payer: Self-pay | Admitting: Podiatry

## 2020-04-03 DIAGNOSIS — G5781 Other specified mononeuropathies of right lower limb: Secondary | ICD-10-CM

## 2020-04-03 DIAGNOSIS — G5761 Lesion of plantar nerve, right lower limb: Secondary | ICD-10-CM | POA: Diagnosis not present

## 2020-04-03 DIAGNOSIS — G5762 Lesion of plantar nerve, left lower limb: Secondary | ICD-10-CM

## 2020-04-03 DIAGNOSIS — G5782 Other specified mononeuropathies of left lower limb: Secondary | ICD-10-CM

## 2020-04-03 MED ORDER — TRIAMCINOLONE ACETONIDE 40 MG/ML IJ SUSP
40.0000 mg | Freq: Once | INTRAMUSCULAR | Status: AC
Start: 2020-04-03 — End: 2020-04-03
  Administered 2020-04-03: 40 mg

## 2020-04-03 NOTE — Progress Notes (Signed)
She presents today chief complaint of painful neuromas third interdigital space.  She states that they bother me again they were doing really well for quite some time now that is flared up.  Objective: Pulses are strong and palpable.  She has no pain on range of motion of the toes though she does have pain on direct palpation third interdigital space bilaterally with a palpable Mulder's click.  Assessment: Neuroma third interdigital space bilateral.  Plan: I injected the neuromas today 10 mg Kenalog 5 mg Marcaine to the point of maximal tenderness.  We will follow-up with her again on an as-needed basis.

## 2020-08-18 ENCOUNTER — Ambulatory Visit (INDEPENDENT_AMBULATORY_CARE_PROVIDER_SITE_OTHER): Payer: Medicare Other | Admitting: Gastroenterology

## 2020-08-18 ENCOUNTER — Encounter: Payer: Self-pay | Admitting: Gastroenterology

## 2020-08-18 ENCOUNTER — Telehealth: Payer: Self-pay

## 2020-08-18 VITALS — BP 132/70 | HR 69 | Ht 62.0 in | Wt 149.0 lb

## 2020-08-18 DIAGNOSIS — Z7902 Long term (current) use of antithrombotics/antiplatelets: Secondary | ICD-10-CM | POA: Diagnosis not present

## 2020-08-18 DIAGNOSIS — Z86010 Personal history of colonic polyps: Secondary | ICD-10-CM | POA: Diagnosis not present

## 2020-08-18 NOTE — Patient Instructions (Addendum)
It was my pleasure to provide care to you today. Based on our discussion, I am providing you with my recommendations below:  RECOMMENDATION(S):   You have been scheduled for a colonoscopy. Please follow written instructions given to you at your visit today.   PREP:   Please pick up your prep supplies at the pharmacy within the next 1-3 days.  INHALERS:   If you use inhalers (even only as needed), please bring them with you on the day of your procedure.  MEDICATIONS TO HOLD:  We will contact your provider to request permission for you to hold Aggrenox. Once we receive a response, you will be contacted by our office. If you do not hear from our office 1 week prior to your scheduled procedure, please contact our office at [phone].   COLONOSCOPY TIPS:  To reduce nausea and dehydration, stay well hydrated for 3-4 days prior to the exam.  To prevent skin/hemorrhoid irritation - prior to wiping, put A&Dointment or vaseline on the toilet paper. Keep a towel or pad on the bed.  BEFORE STARTING YOUR PREP, drink  64oz of clear liquids in the morning. This will help to flush the colon and will ensure you are well hydrated!!!!  NOTE - This is in addition to the fluids required for to complete your prep. Use of a flavored hard candy, such as grape Anise Salvo, can counteract some of the flavor of the prep and may prevent some nausea.    FOLLOW UP:  After your procedure, you will receive a call from my office staff regarding my recommendation for follow up.  BMI:  If you are age 24 or older, your body mass index should be between 23-30. Your There is no height or weight on file to calculate BMI. If this is out of the aforementioned range listed, please consider follow up with your Primary Care Provider.  MY CHART:  The [facility] providers would like to encourage you to use [facility] to communicate with providers for non-urgent requests or questions.  Due to long hold times on the  telephone, sending your provider a message by [facility] may be a faster and more efficient way to get a response.  Please allow 48 business hours for a response.  Please remember that this is for non-urgent requests.   Thank you for trusting me with your gastrointestinal care!    Thornton Park, MD, MPH

## 2020-08-18 NOTE — Telephone Encounter (Signed)
Pre-operative Risk Assessment     Rachel Donovan 1945-09-28 [id number]  Procedure: Colonoscopy Anesthesia type:  MAC Procedure Date: 08/28/20 Provider: Dr. Tarri Glenn  Type of Clearance needed: Pharmacy  Medication(s) needing held: Aggrenox   Length of time for medication to be held: 5-7 days  Please review request and advise by either responding to this message or by sending your response to the fax # provided below.  Thank you,  [facility]  Phone: [phone] Fax: [phone] ATTENTION: Rachel Kaufhold, LPN

## 2020-08-18 NOTE — Progress Notes (Signed)
Referring Provider: Reynold Bowen, MD Primary Care Physician:  Reynold Bowen, MD  Reason for Consultation:  History of colon polyps   IMPRESSION:  History of tubular adenoma 2017. Performing endoscopist recommended surveillance colonoscopy in 5 years.    On chronic Aggrenox. Ideally, would hold Aggrenox for 3 days and continue aspirin alone leading up to the colonoscopy.   PLAN: Surveillance colonoscopy after an Aggrenox washout if it is approved by Dr. Forde Dandy  Please see the "Patient Instructions" section for addition details about the plan.  HPI: Rachel Donovan is a 75 y.o. female referred by Dr. Forde Dandy for colonoscopy for colon cancer screening. The history is obtained through the patient, review of her electronic health record, and review of records provided by Dr. Forde Dandy.  She has anxiety, arthritis, history of melanoma, hypercholesterolemia, hypertension, migraines and mild stroke in 2000, and a hysterectomy in 1996  Prior colonoscopies with Dr. Kinnie Scales. Start colon cancer screening at age 14.  Last colonoscopy 04/10/2015 showed hepatic flexure tubular adenoma and 2 hyperplastic polyps as well as sigmoid diverticulosis.  Surveillance colonoscopy recommended in 5 years.  GI ROS is negative.  She is happy with her GI health.  Notes that she doesn't wake up well after anethesia.   Recent labs: Hemosure negative, normal liver enzymes, normal CBC  No known family history of colon cancer or polyps. No family history of uterine/endometrial cancer, pancreatic cancer or gastric/stomach cancer.   Past Medical History:  Diagnosis Date   Anxiety    Arthritis    Colon polyp    CVA (cerebral vascular accident) ([place]) 2006   High cholesterol    Hypertension    Hypothyroid    surgical   Melanoma ([place])    Rectocele     Past Surgical History:  Procedure Laterality Date   BREAST BIOPSY Right    X 2   BREAST BIOPSY Left    BREAST SURGERY     BX breast lumps X 3   COLONOSCOPY     PARTIAL  HYSTERECTOMY     REPLACEMENT TOTAL KNEE Bilateral    THYROIDECTOMY      Current Outpatient Medications  Medication Sig Dispense Refill   atorvastatin (LIPITOR) 20 MG tablet atorvastatin 20 mg tablet     betamethasone dipropionate 0.05 % lotion Apply topically as needed.      Calcium Carbonate-Vit D-Min (CALCIUM 1200 PO) Take by mouth daily.     dipyridamole-aspirin (AGGRENOX) 200-25 MG 12hr capsule Take 1 capsule by mouth 2 (two) times daily.      felodipine (PLENDIL) 10 MG 24 hr tablet Take 10 mg by mouth daily.     Folic Acid-Vit [id number] [id number] (VIRT-GARD) 2.2-25-1 MG TABS Take 1 tablet by mouth daily.     furosemide (LASIX) 20 MG tablet Take 20 mg by mouth daily as needed.      levothyroxine (SYNTHROID, LEVOTHROID) 125 MCG tablet Take 125 mcg by mouth every other day.   2   levothyroxine (SYNTHROID, LEVOTHROID) 137 MCG tablet TK 1 T PO EVERY MORNING BEFORE BREAKFAST ALTERNATING WITH 125MCG T  1   LORazepam (ATIVAN) 1 MG tablet TK 1/2 T PO QHS PRN  1   losartan (COZAAR) 100 MG tablet losartan 100 mg tablet     metoprolol succinate (TOPROL-XL) 50 MG 24 hr tablet Take 1 tablet (50 mg total) by mouth daily. 90 tablet 3   Multiple Vitamin (GNP ESSENTIAL ONE DAILY) TABS Take by mouth daily.      venlafaxine XR (EFFEXOR-XR) 75 MG  24 hr capsule Take 1 tablet by mouth daily.     fluticasone (FLONASE) 50 MCG/ACT nasal spray Place into the nose. (Patient not taking: Reported on 08/18/2020)     No current facility-administered medications for this visit.    Allergies as of 08/18/2020 - Review Complete 08/18/2020  Allergen Reaction Noted   Codeine Hives 11/02/2016   Penicillins Hives 11/02/2016   Sulfa antibiotics Hives 05/19/2017    Family History  Problem Relation Age of Onset   Stroke Mother    Hypertension Mother    Heart disease Mother    Arthritis Mother    Kidney disease Mother    Hypertension Father    Heart disease Father    Arthritis Father    Hypertension Brother    Heart  attack Brother    Heart disease Brother    Arthritis Brother    Thyroid cancer Son        Review of Systems: 12 system ROS is negative except as noted above except for anxiety, arthritis, ankle swelling, and urine leakage.   Physical Exam: General:   Alert,  well-nourished, pleasant and cooperative in NAD Head:  Normocephalic and atraumatic. Eyes:  Sclera clear, no icterus.   Conjunctiva pink. Ears:  Normal auditory acuity. Nose:  No deformity, discharge,  or lesions. Mouth:  No deformity or lesions.   Neck:  Supple; no masses or thyromegaly. Lungs:  Clear throughout to auscultation.   No wheezes. Heart:  Regular rate and rhythm; no murmurs. Abdomen:  Soft,nontender, nondistended, normal bowel sounds, no rebound or guarding. No hepatosplenomegaly.   Rectal:  Deferred  Msk:  Symmetrical. No boney deformities LAD: No inguinal or umbilical LAD Extremities:  No clubbing or edema. Neurologic:  Alert and  oriented x4;  grossly nonfocal Skin:  Intact without significant lesions or rashes. Psych:  Alert and cooperative. Normal mood and affect.    Micaila Ziemba L. Tarri Glenn, MD, MPH 08/18/2020, 5:37 PM

## 2020-08-22 ENCOUNTER — Other Ambulatory Visit: Payer: Self-pay | Admitting: Obstetrics and Gynecology

## 2020-08-22 ENCOUNTER — Telehealth: Payer: Self-pay | Admitting: Gastroenterology

## 2020-08-22 DIAGNOSIS — Z1231 Encounter for screening mammogram for malignant neoplasm of breast: Secondary | ICD-10-CM

## 2020-08-22 NOTE — Telephone Encounter (Signed)
SECOND ATTEMPT:  Request was faxed 4 days ago to requested fax #'s. Once again, faxing request to same #'s as staff has claimed to have not received. Refer to confirmations below:  Chart Review Routing History Since 08/24/2019 [facility] Full Routing History)  Recipients Sent On Sent By Routed Reports   Dr. Forde Dandy   Dr. Forde Dandy   08/22/2020  1:19 PM Aleatha Borer, LPN Telephone on 09/22/5907 with Aleatha Borer, LPN      Cover Page Message : Please review and advise       Dr. Forde Dandy   08/21/2020 11:56 AM Aleatha Borer, LPN Telephone on 04/14/2160 with Aleatha Borer, LPN      Cover Page Message : Please review and advise       Will continue to await response

## 2020-08-22 NOTE — Telephone Encounter (Signed)
[facility] called said they have not received any faxes from office and provided another fax number: [phone] ATTN: Judie Petit w\Dr. Forde Dandy.

## 2020-08-22 NOTE — Telephone Encounter (Signed)
Called Dr. Baldwin Crown office to obtain clearance re: holding Aggrenox. Left detailed VM informing Caryl Pina, RN about our need. Advised she return our call ASAP with further instructions.

## 2020-08-22 NOTE — Telephone Encounter (Signed)
[facility] calling requesting medical clearance forms faxed to [phone].. plz advise  thanks

## 2020-08-22 NOTE — Telephone Encounter (Signed)
Addressed in previously created encounter. Closing as duplicate

## 2020-08-22 NOTE — Telephone Encounter (Signed)
Eustace Pen, RN and left detailed message asking her to return my call verifying that she did in fact receive faxed requests to BOTH alternate #'s. Will await returned call.

## 2020-08-23 NOTE — Telephone Encounter (Signed)
Received call from [facility] with Dr. Baldwin Crown office. States Dr. Forde Dandy is out of the office and will not return until Friday. Will not be able to provide a response until his return. Pt is scheduled 08/28/20 for procedure and will likely need rescheduled d/t lack of response. Routing this message to Dr. Tarri Glenn to make her aware of this concern.

## 2020-08-23 NOTE — Telephone Encounter (Signed)
THIRD ATTEMPT:  Chart Review Routing History Since 08/25/2019 (View Full Routing History)  Recipients Sent On Sent By Routed Reports   ATTENTION: [place]   08/23/2020  8:54 AM Aleatha Borer, LPN Telephone on 09/05/2177 with Aleatha Borer, LPN      Cover Page Message : Rose Phi!!

## 2020-08-23 NOTE — Telephone Encounter (Signed)
FOURTH ATTEMPT:  Called Dr. Baldwin Crown office to f/u on status of our request to hold Aggrenox. VM left with Estill Bamberg, RMA w/ details re: our several attempts and failed responses. Advised to return call ASAP.

## 2020-08-24 NOTE — Telephone Encounter (Signed)
Called pt and advised that we will cancel her procedure at this time d/t failure to receive a response from her PCP in timely manner re: Aggrenox. Advised, once we receive Dr. Baldwin Crown response, we will then proceed with rescheduling her procedure. Pt was disappointed although understandable. Expressed appreciation for my call.

## 2020-08-28 ENCOUNTER — Encounter: Payer: Medicare Other | Admitting: Gastroenterology

## 2020-08-30 ENCOUNTER — Other Ambulatory Visit: Payer: Self-pay

## 2020-08-30 DIAGNOSIS — Z86010 Personal history of colonic polyps: Secondary | ICD-10-CM

## 2020-08-30 NOTE — Telephone Encounter (Signed)
Received notification from Dr. Baldwin Crown office. Per Dr. Forde Dandy. R CVA 2007, okay to hold Aggrenox 5 days.   Called pt and rescheduled procedure to 09/13/20 @ 7:30am. Expressed understanding of above instructions. Declined to have new instructions mailed, stating "I'll just change my dates and times". Prep products are OTC so no need for Rx to be sent. New ambulatory referral placed today with newly scheduled procedure date/time.

## 2020-08-30 NOTE — Telephone Encounter (Signed)
Called and left a detailed VM for Claiborne Billings, CMA to return my call.

## 2020-08-31 NOTE — Telephone Encounter (Signed)
Noted. Thanks.

## 2020-09-13 ENCOUNTER — Other Ambulatory Visit: Payer: Self-pay

## 2020-09-13 ENCOUNTER — Encounter: Payer: Self-pay | Admitting: Gastroenterology

## 2020-09-13 ENCOUNTER — Ambulatory Visit (AMBULATORY_SURGERY_CENTER): Payer: Medicare Other | Admitting: Gastroenterology

## 2020-09-13 ENCOUNTER — Ambulatory Visit: Payer: Medicare Other

## 2020-09-13 VITALS — BP 120/63 | HR 52 | Temp 97.3°F | Resp 13 | Ht 62.0 in | Wt 149.0 lb

## 2020-09-13 DIAGNOSIS — K621 Rectal polyp: Secondary | ICD-10-CM

## 2020-09-13 DIAGNOSIS — K635 Polyp of colon: Secondary | ICD-10-CM

## 2020-09-13 DIAGNOSIS — K388 Other specified diseases of appendix: Secondary | ICD-10-CM | POA: Diagnosis not present

## 2020-09-13 DIAGNOSIS — D128 Benign neoplasm of rectum: Secondary | ICD-10-CM

## 2020-09-13 DIAGNOSIS — Z86010 Personal history of colonic polyps: Secondary | ICD-10-CM

## 2020-09-13 DIAGNOSIS — D129 Benign neoplasm of anus and anal canal: Secondary | ICD-10-CM

## 2020-09-13 MED ORDER — SODIUM CHLORIDE 0.9 % IV SOLN: 500.0000 mL | Freq: Once | INTRAVENOUS | Status: AC

## 2020-09-13 NOTE — Progress Notes (Signed)
Report given to PACU, vss 

## 2020-09-13 NOTE — Progress Notes (Signed)
Referring Provider: Reynold Bowen, MD Primary Care Physician:  Reynold Bowen, MD  Reason for Consultation:  History of colon polyps   IMPRESSION:  History of tubular adenoma 2017. Performing endoscopist recommended surveillance colonoscopy in 5 years.    Aggrenox has been on hold for 3 days  PLAN: Surveillance colonoscopy after an Aggrenox washout if it is approved by Dr. Forde Dandy   HPI: Rachel Donovan is a 75 y.o. female who presents for colonoscopy for colon cancer screening. She has anxiety, arthritis, history of melanoma, hypercholesterolemia, hypertension, migraines and mild stroke in 2000, and a hysterectomy in 1996  Prior colonoscopies with Dr. Kinnie Scales. Start colon cancer screening at age 63.  Last colonoscopy 04/10/2015 showed hepatic flexure tubular adenoma and 2 hyperplastic polyps as well as sigmoid diverticulosis.  Surveillance colonoscopy recommended in 5 years.  GI ROS is negative.  She is happy with her GI health.  Notes that she doesn't wake up well after anethesia.   Recent labs: Hemosure negative, normal liver enzymes, normal CBC  No known family history of colon cancer or polyps. No family history of uterine/endometrial cancer, pancreatic cancer or gastric/stomach cancer.   Past Medical History:  Diagnosis Date   Anxiety    Arthritis    Colon polyp    CVA (cerebral vascular accident) ([place]) 2006   High cholesterol    Hypertension    Hypothyroid    surgical   Melanoma ([place])    Rectocele     Past Surgical History:  Procedure Laterality Date   BREAST BIOPSY Right    X 2   BREAST BIOPSY Left    BREAST SURGERY     BX breast lumps X 3   COLONOSCOPY     PARTIAL HYSTERECTOMY     REPLACEMENT TOTAL KNEE Bilateral    THYROIDECTOMY      Current Outpatient Medications  Medication Sig Dispense Refill   atorvastatin (LIPITOR) 20 MG tablet atorvastatin 20 mg tablet     Calcium Carbonate-Vit D-Min (CALCIUM 1200 PO) Take by mouth daily.     felodipine (PLENDIL) 10  MG 24 hr tablet Take 10 mg by mouth daily.     furosemide (LASIX) 20 MG tablet Take 20 mg by mouth daily as needed.      levothyroxine (SYNTHROID, LEVOTHROID) 125 MCG tablet Take 125 mcg by mouth every other day.   2   levothyroxine (SYNTHROID, LEVOTHROID) 137 MCG tablet TK 1 T PO EVERY MORNING BEFORE BREAKFAST ALTERNATING WITH 125MCG T  1   losartan (COZAAR) 100 MG tablet losartan 100 mg tablet     metoprolol succinate (TOPROL-XL) 50 MG 24 hr tablet Take 1 tablet (50 mg total) by mouth daily. 90 tablet 3   Multiple Vitamin (GNP ESSENTIAL ONE DAILY) TABS Take by mouth daily.      venlafaxine XR (EFFEXOR-XR) 75 MG 24 hr capsule Take 1 tablet by mouth daily.     betamethasone dipropionate 0.05 % lotion Apply topically as needed.      dipyridamole-aspirin (AGGRENOX) 200-25 MG 12hr capsule Take 1 capsule by mouth 2 (two) times daily.      fluticasone (FLONASE) 50 MCG/ACT nasal spray Place into the nose. (Patient not taking: No sig reported)     Folic Acid-Vit [id number] (VIRT-GARD) 2.2-25-1 MG TABS Take 1 tablet by mouth daily.     LORazepam (ATIVAN) 1 MG tablet TK 1/2 T PO QHS PRN  1   Current Facility-Administered Medications  Medication Dose Route Frequency Provider Last Rate Last Admin  0.9 %  sodium chloride infusion  500 mL Intravenous Once Thornton Park, MD        Allergies as of 09/13/2020 - Review Complete 09/13/2020  Allergen Reaction Noted   Codeine Hives 11/02/2016   Penicillins Hives 11/02/2016   Sulfa antibiotics Hives 05/19/2017    Family History  Problem Relation Age of Onset   Stroke Mother    Hypertension Mother    Heart disease Mother    Arthritis Mother    Kidney disease Mother    Hypertension Father    Heart disease Father    Arthritis Father    Hypertension Brother    Heart attack Brother    Heart disease Brother    Arthritis Brother    Thyroid cancer Son      Physical Exam: General:   Alert,  well-nourished, pleasant and cooperative in NAD Head:   Normocephalic and atraumatic. Eyes:  Sclera clear, no icterus.   Conjunctiva pink. Ears:  Normal auditory acuity. Nose:  No deformity, discharge,  or lesions. Mouth:  No deformity or lesions.   Neck:  Supple; no masses or thyromegaly. Lungs:  Clear throughout to auscultation.   No wheezes. Heart:  Regular rate and rhythm; no murmurs. Abdomen:  Soft, nontender, nondistended, normal bowel sounds, no rebound or guarding. No hepatosplenomegaly.   Rectal:  Deferred  Msk:  Symmetrical. No boney deformities LAD: No inguinal or umbilical LAD Extremities:  No clubbing or edema. Neurologic:  Alert and  oriented x4;  grossly nonfocal Skin:  Intact without significant lesions or rashes. Psych:  Alert and cooperative. Normal mood and affect.    Lakisha Peyser L. Tarri Glenn, MD, MPH 09/13/2020, 7:59 AM

## 2020-09-13 NOTE — Patient Instructions (Signed)
Follow a high fiber diet, continue present medications. Resume Aggrenox tomorrow. Awaiting pathology results. Repeat Colonoscopy not recommended due to age.  YOU HAD AN ENDOSCOPIC PROCEDURE TODAY AT [facility]:   Refer to the procedure report that was given to you for any specific questions about what was found during the examination.  If the procedure report does not answer your questions, please call your gastroenterologist to clarify.  If you requested that your care partner not be given the details of your procedure findings, then the procedure report has been included in a sealed envelope for you to review at your convenience later.  YOU SHOULD EXPECT: Some feelings of bloating in the abdomen. Passage of more gas than usual.  Walking can help get rid of the air that was put into your GI tract during the procedure and reduce the bloating. If you had a lower endoscopy (such as a colonoscopy or flexible sigmoidoscopy) you may notice spotting of blood in your stool or on the toilet paper. If you underwent a bowel prep for your procedure, you may not have a normal bowel movement for a few days.  Please Note:  You might notice some irritation and congestion in your nose or some drainage.  This is from the oxygen used during your procedure.  There is no need for concern and it should clear up in a day or so.  SYMPTOMS TO REPORT IMMEDIATELY:  Following lower endoscopy (colonoscopy or flexible sigmoidoscopy):  Excessive amounts of blood in the stool  Significant tenderness or worsening of abdominal pains  Swelling of the abdomen that is new, acute  Fever of 100F or higher  For urgent or emergent issues, a gastroenterologist can be reached at any hour by calling [phone]. Do not use MyChart messaging for urgent concerns.    DIET:  We do recommend a small meal at first, but then you may proceed to your regular diet.  Drink plenty of fluids but you should avoid alcoholic  beverages for 24 hours.  ACTIVITY:  You should plan to take it easy for the rest of today and you should NOT DRIVE or use heavy machinery until tomorrow (because of the sedation medicines used during the test).    FOLLOW UP: Our staff will call the number listed on your records 48-72 hours following your procedure to check on you and address any questions or concerns that you may have regarding the information given to you following your procedure. If we do not reach you, we will leave a message.  We will attempt to reach you two times.  During this call, we will ask if you have developed any symptoms of COVID 19. If you develop any symptoms (ie: fever, flu-like symptoms, shortness of breath, cough etc.) before then, please call [phone].  If you test positive for Covid 19 in the 2 weeks post procedure, please call and report this information to [place].    If any biopsies were taken you will be contacted by phone or by letter within the next 1-3 weeks.  Please call us at [phone] if you have not heard about the biopsies in 3 weeks.    SIGNATURES/CONFIDENTIALITY: You and/or your care partner have signed paperwork which will be entered into your electronic medical record.  These signatures attest to the fact that that the information above on your After Visit Summary has been reviewed and is understood.  Full responsibility of the confidentiality of this discharge information lies with you and/or  your care-partner.

## 2020-09-13 NOTE — Op Note (Signed)
[facility] Patient Name: Rachel Donovan Procedure Date: 09/13/2020 7:50 AM MRN: [id number] Endoscopist: Thornton Park MD, MD Age: 75 Referring MD:  Date of Birth: [birth year] Gender: Female Account #: [id number] Procedure:                Colonoscopy Indications:              Surveillance: Personal history of adenomatous                            polyps on last colonoscopy 5 years ago                           Prior colonoscopies with Dr. Kinnie Scales. Started colon                            cancer screening at age 48. Last colonoscopy                            04/10/2015 with a hepatic flexure tubular adenoma and                            2 hyperplastic polyps. Surveillance recommended in                            5 years. Medicines:                Monitored Anesthesia Care Procedure:                Pre-Anesthesia Assessment:                           - Prior to the procedure, a History and Physical                            was performed, and patient medications and                            allergies were reviewed. The patient's tolerance of                            previous anesthesia was also reviewed. The risks                            and benefits of the procedure and the sedation                            options and risks were discussed with the patient.                            All questions were answered, and informed consent                            was obtained. Prior Anticoagulants: The patient has  taken Aggrenox, last dose was 3 days prior to                            procedure. ASA Grade Assessment: II - A patient                            with mild systemic disease. After reviewing the                            risks and benefits, the patient was deemed in                            satisfactory condition to undergo the procedure.                           After obtaining informed consent, the colonoscope                             was passed under direct vision. Throughout the                            procedure, the patient's blood pressure, pulse, and                            oxygen saturations were monitored continuously. The                            Olympus PCF-H190DL [id number]) Colonoscope was                            introduced through the anus and advanced to the 3                            cm into the ileum. The colonoscopy was performed                            without difficulty. The patient tolerated the                            procedure well. The quality of the bowel                            preparation was good. The terminal ileum, ileocecal                            valve, appendiceal orifice, and rectum were                            photographed. Scope In: 8:02:00 AM Scope Out: 8:20:21 AM Scope Withdrawal Time: 0 hours 14 minutes 42 seconds  Total Procedure Duration: 0 hours 18 minutes 21 seconds  Findings:                 The perianal and digital rectal examinations were  normal.                           Multiple small-mouthed diverticula were found in                            the sigmoid colon.                           Two flat polyps were found in the rectum. The                            polyps were 1 to 2 mm in size. These polyps were                            removed with a cold snare. Resection and retrieval                            were complete. Estimated blood loss was minimal.                           A 1 mm polyp was found in the appendiceal orifice.                            The polyp was sessile. The polyp was removed with a                            cold snare. Resection and retrieval were complete.                            Estimated blood loss was minimal.                           Non-bleeding internal hemorrhoids were found. Complications:            No immediate complications. Estimated blood loss:                             Minimal. Estimated Blood Loss:     Estimated blood loss was minimal. Impression:               - Diverticulosis in the sigmoid colon.                           - Two 1 to 2 mm polyps in the rectum, removed with                            a cold snare. Resected and retrieved.                           - One 1 mm polyp at the appendiceal orifice,                            removed with a cold snare. Resected and retrieved.                           -  Non-bleeding internal hemorrhoids. Recommendation:           - Patient has a contact number available for                            emergencies. The signs and symptoms of potential                            delayed complications were discussed with the                            patient. Return to normal activities tomorrow.                            Written discharge instructions were provided to the                            patient.                           - Follow a high fiber diet. Drink at least 64                            ounces of water daily. Add a daily stool bulking                            agent such as psyllium (an exampled would be                            Metamucil).                           - Continue present medications.                           - Resume Aggrenox tomorrow.                           - Await pathology results.                           - Repeat colonoscopy for surveillance is not                            recommended due to current age.                           - Emerging evidence supports eating a diet of                            fruits, vegetables, grains, calcium, and yogurt                            while reducing red meat and alcohol may reduce the  risk of colon cancer. Thornton Park MD, MD 09/13/2020 8:28:55 AM This report has been signed electronically.

## 2020-09-13 NOTE — Progress Notes (Signed)
Called to room to assist during endoscopic procedure.  Patient ID and intended procedure confirmed with present staff. Received instructions for my participation in the procedure from the performing physician.  

## 2020-09-13 NOTE — Progress Notes (Signed)
Vs by CW 

## 2020-09-15 ENCOUNTER — Telehealth: Payer: Self-pay

## 2020-09-15 ENCOUNTER — Encounter: Payer: Self-pay | Admitting: Gastroenterology

## 2020-09-15 NOTE — Telephone Encounter (Signed)
  Follow up Call-  Call back number 09/13/2020  Post procedure Call Back phone  # [phone]  Permission to leave phone message Yes  Some recent data might be hidden     Patient questions:  Do you have a fever, pain , or abdominal swelling? No. Pain Score  0 *  Have you tolerated food without any problems? Yes.    Have you been able to return to your normal activities? Yes.    Do you have any questions about your discharge instructions: Diet   No. Medications  No. Follow up visit  No.  Do you have questions or concerns about your Care? No.  Actions: * If pain score is 4 or above: No action needed, pain <4.  Have you developed a fever since your procedure? no  2.   Have you had an respiratory symptoms (SOB or cough) since your procedure? no  3.   Have you tested positive for COVID 19 since your procedure no  4.   Have you had any family members/close contacts diagnosed with the COVID 19 since your procedure?  no   If yes to any of these questions please route to Joylene John, RN and Joella Prince, RN

## 2020-09-20 ENCOUNTER — Ambulatory Visit (INDEPENDENT_AMBULATORY_CARE_PROVIDER_SITE_OTHER): Payer: Medicare Other | Admitting: Obstetrics and Gynecology

## 2020-09-20 ENCOUNTER — Other Ambulatory Visit: Payer: Self-pay

## 2020-09-20 VITALS — BP 126/72 | HR 70 | Ht 62.5 in | Wt 150.6 lb

## 2020-09-20 DIAGNOSIS — G5760 Lesion of plantar nerve, unspecified lower limb: Secondary | ICD-10-CM | POA: Insufficient documentation

## 2020-09-20 DIAGNOSIS — N816 Rectocele: Secondary | ICD-10-CM | POA: Diagnosis not present

## 2020-09-20 DIAGNOSIS — Z8739 Personal history of other diseases of the musculoskeletal system and connective tissue: Secondary | ICD-10-CM | POA: Diagnosis not present

## 2020-09-20 DIAGNOSIS — Z01419 Encounter for gynecological examination (general) (routine) without abnormal findings: Secondary | ICD-10-CM | POA: Diagnosis not present

## 2020-09-20 DIAGNOSIS — I739 Peripheral vascular disease, unspecified: Secondary | ICD-10-CM | POA: Insufficient documentation

## 2020-09-20 DIAGNOSIS — E2839 Other primary ovarian failure: Secondary | ICD-10-CM

## 2020-09-20 NOTE — Patient Instructions (Signed)
EXERCISE   We recommended that you start or continue a regular exercise program for good health. Physical activity is anything that gets your body moving, some is better than none. The CDC recommends 150 minutes per week of Moderate-Intensity Aerobic Activity and 2 or more days of Muscle Strengthening Activity.  Benefits of exercise are limitless: helps weight loss/weight maintenance, improves mood and energy, helps with depression and anxiety, improves sleep, tones and strengthens muscles, improves balance, improves bone density, protects from chronic conditions such as heart disease, high blood pressure and diabetes and so much more. To learn more visit: https://www.cdc.gov/physicalactivity/index.html  DIET: Good nutrition starts with a healthy diet of fruits, vegetables, whole grains, and lean protein sources. Drink plenty of water for hydration. Minimize empty calories, sodium, sweets. For more information about dietary recommendations visit: https://health.gov/our-work/nutrition-physical-activity/dietary-guidelines and https://www.myplate.gov/  ALCOHOL:  Women should limit their alcohol intake to no more than 7 drinks/beers/glasses of wine (combined, not each!) per week. Moderation of alcohol intake to this level decreases your risk of breast cancer and liver damage.  If you are concerned that you may have a problem, or your friends have told you they are concerned about your drinking, there are many resources to help. A well-known program that is free, effective, and available to all people all over the nation is Alcoholics Anonymous.  Check out this site to learn more: https://www.aa.org/   CALCIUM AND VITAMIN D:  Adequate intake of calcium and Vitamin D are recommended for bone health.  You should be getting between 1000-1200 mg of calcium and 800 units of Vitamin D daily between diet and supplements  PAP SMEARS:  Pap smears, to check for cervical cancer or precancers,  have traditionally been  done yearly, scientific advances have shown that most women can have pap smears less often.  However, every woman still should have a physical exam from her gynecologist every year. It will include a breast check, inspection of the vulva and vagina to check for abnormal growths or skin changes, a visual exam of the cervix, and then an exam to evaluate the size and shape of the uterus and ovaries. We will also provide age appropriate advice regarding health maintenance, like when you should have certain vaccines, screening for sexually transmitted diseases, bone density testing, colonoscopy, mammograms, etc.   MAMMOGRAMS:  All women over 40 years old should have a routine mammogram.   COLON CANCER SCREENING: Now recommend starting at age 45. At this time colonoscopy is not covered for routine screening until 50. There are take home tests that can be done between 45-49.   COLONOSCOPY:  Colonoscopy to screen for colon cancer is recommended for all women at age 50.  We know, you hate the idea of the prep.  We agree, BUT, having colon cancer and not knowing it is worse!!  Colon cancer so often starts as a polyp that can be seen and removed at colonscopy, which can quite literally save your life!  And if your first colonoscopy is normal and you have no family history of colon cancer, most women don't have to have it again for 10 years.  Once every ten years, you can do something that may end up saving your life, right?  We will be happy to help you get it scheduled when you are ready.  Be sure to check your insurance coverage so you understand how much it will cost.  It may be covered as a preventative service at no cost, but you should check   your particular policy.      Breast Self-Awareness Breast self-awareness means being familiar with how your breasts look and feel. It involves checking your breasts regularly and reporting any changes to your health care provider. Practicing breast self-awareness is  important. A change in your breasts can be a sign of a serious medical problem. Being familiar with how your breasts look and feel allows you to find any problems early, when treatment is more likely to be successful. All women should practice breast self-awareness, including women who have had breast implants. How to do a breast self-exam One way to learn what is normal for your breasts and whether your breasts are changing is to do a breast self-exam. To do a breast self-exam: Look for Changes  Remove all the clothing above your waist. Stand in front of a mirror in a room with good lighting. Put your hands on your hips. Push your hands firmly downward. Compare your breasts in the mirror. Look for differences between them (asymmetry), such as: Differences in shape. Differences in size. Puckers, dips, and bumps in one breast and not the other. Look at each breast for changes in your skin, such as: Redness. Scaly areas. Look for changes in your nipples, such as: Discharge. Bleeding. Dimpling. Redness. A change in position. Feel for Changes Carefully feel your breasts for lumps and changes. It is best to do this while lying on your back on the floor and again while sitting or standing in the shower or tub with soapy water on your skin. Feel each breast in the following way: Place the arm on the side of the breast you are examining above your head. Feel your breast with the other hand. Start in the nipple area and make  inch (2 cm) overlapping circles to feel your breast. Use the pads of your three middle fingers to do this. Apply light pressure, then medium pressure, then firm pressure. The light pressure will allow you to feel the tissue closest to the skin. The medium pressure will allow you to feel the tissue that is a little deeper. The firm pressure will allow you to feel the tissue close to the ribs. Continue the overlapping circles, moving downward over the breast until you feel your  ribs below your breast. Move one finger-width toward the center of the body. Continue to use the  inch (2 cm) overlapping circles to feel your breast as you move slowly up toward your collarbone. Continue the up and down exam using all three pressures until you reach your armpit.  Write Down What You Find  Write down what is normal for each breast and any changes that you find. Keep a written record with breast changes or normal findings for each breast. By writing this information down, you do not need to depend only on memory for size, tenderness, or location. Write down where you are in your menstrual cycle, if you are still menstruating. If you are having trouble noticing differences in your breasts, do not get discouraged. With time you will become more familiar with the variations in your breasts and more comfortable with the exam. How often should I examine my breasts? Examine your breasts every month. If you are breastfeeding, the best time to examine your breasts is after a feeding or after using a breast pump. If you menstruate, the best time to examine your breasts is 5-7 days after your period is over. During your period, your breasts are lumpier, and it may be more   difficult to notice changes. When should I see my health care provider? See your health care provider if you notice: A change in shape or size of your breasts or nipples. A change in the skin of your breast or nipples, such as a reddened or scaly area. Unusual discharge from your nipples. A lump or thick area that was not there before. Pain in your breasts. Anything that concerns you. About Rectocele  Overview  A rectocele is a type of hernia which causes different degrees of bulging of the rectal tissues into the vaginal wall.  You may even notice that it presses against the vaginal wall so much that some vaginal tissues droop outside of the opening of your vagina.  Causes of Rectocele  The most common cause is  childbirth.  The muscles and ligaments in the pelvis that hold up and support the female organs and vagina become stretched and weakened during labor and delivery.  The more babies you have, the more the support tissues are stretched and weakened.  Not everyone who has a baby will develop a rectocele.  Some women have stronger supporting tissue in the pelvis and may not have as much of a problem as others.  Women who have a Cesarean section usually do not get rectocele's unless they pushed a long time prior to the cesarean delivery.  Other conditions that can cause a rectocele include chronic constipation, a chronic cough, a lot of heavy lifting, and obesity.  Older women may have this problem because the loss of female hormones causes the vaginal tissue to become weaker.  Symptoms  There may not be any symptoms.  If you do have symptoms, they may include: Pelvic pressure in the rectal area Protrusion of the lower part of the vagina through the opening of the vagina Constipation and trapping of the stool, making it difficult to have a bowel movement.  In severe cases, you may have to press on the lower part of your vagina to help push the stool out of you rectum.  This is called splinting to empty.  Diagnosing Rectocele  Your health care provider will ask about your symptoms and perform a pelvic exam.  S/he will ask you to bear down, pushing like you are having a bowel movement so as to see how far the lower part of the vagina protrudes into the vagina and possible outside of the vagina.  Your provider will also ask you to contract the muscles of your pelvis (like you are stopping the stream in the middle of urinating) to determine the strength of your pelvic muscles.  Your provider may also do a rectal exam.  Treatment Options  If you do not have any symptoms, no treatment may be necessary.  Other treatment options include: Pelvic floor exercises: Contracting the muscles in your genital area may  help strengthen your muscles and support the organs.  Be sure to get proper exercise instruction from you physical therapist. A pessary (removealbe pelvic support device) sometimes helps rectocele symptoms. Surgery: Surgical repair may be necessary. In some cases the uterus may need to be taken out ( a hysterectomy) as well.  There are many types of surgery for pelvic support problems.  Look for physicians who specialize in repair procedures.  You can take care of yourself by: Treating and preventing constipation Avoiding heavy lifting, and lifting correctly (with your legs, not with you waist or back) Treating a chronic cough or bronchitis Not smoking avoiding too much weight gain Doing  pelvic floor exercises   2007, [employer] Doc.33

## 2020-09-20 NOTE — Progress Notes (Signed)
75 y.o. G85P1001 Married White or Caucasian Not Hispanic or Latino female here for annual exam.  H/O TVH. H/O small grade 2 rectocele. She only notices the rectocele when she goes to the bathroom, she needs to reduce it to have a BM (sometimes). Tolerable.   Not sexually active.   H/O OAB, wears a pad. She leaks at night when she tries to get to the bathroom. Occasionally leaks during the day, only if she is too full. Tolerable.   Patient does not want rectal exam because she had a colonoscopy last week.   She started Effexor with covid for depression and anxiety, helping.   Her 21 year old grandson is singing the [employer] for a [employer] this weekend.     No LMP recorded. Patient has had a hysterectomy.          Sexually active: No.  The current method of family planning is post menopausal status.    Exercising: Yes.     Walking and the [employer] stretching and strengthing  Smoker:  no  Health Maintenance: Pap:  2017 WNL  History of abnormal Pap:  no MMG:  09/13/19 Bi-rads 1 neg, scheduled in 9/22.  BMD:   06/27/17 osteopenia, T score -2.4, Frax 11.5/2.3% Colonoscopy: 09/13/20 normal, no more due to age.  TDaP:  01/10/2017  Gardasil: na    reports that she quit smoking about 42 years ago. Her smoking use included cigarettes. She has a 6.00 pack-year smoking history. She has never used smokeless tobacco. She reports current alcohol use. She reports that she does not use drugs. One drink a week. She has one son, 3 step-children, 6 grandchildren and 4 great-grandchildren  Past Medical History:  Diagnosis Date   Anxiety    Arthritis    Colon polyp    CVA (cerebral vascular accident) ([place]) 2006   High cholesterol    Hypertension    Hypothyroid    surgical   Melanoma ([place])    Rectocele     Past Surgical History:  Procedure Laterality Date   BREAST BIOPSY Right    X 2   BREAST BIOPSY Left    BREAST SURGERY     BX breast lumps X 3   COLONOSCOPY     PARTIAL HYSTERECTOMY      REPLACEMENT TOTAL KNEE Bilateral    THYROIDECTOMY      Current Outpatient Medications  Medication Sig Dispense Refill   atorvastatin (LIPITOR) 20 MG tablet atorvastatin 20 mg tablet     betamethasone dipropionate 0.05 % lotion Apply topically as needed.      Calcium Carbonate-Vit D-Min (CALCIUM 1200 PO) Take by mouth daily.     dipyridamole-aspirin (AGGRENOX) 200-25 MG 12hr capsule Take 1 capsule by mouth 2 (two) times daily.      felodipine (PLENDIL) 10 MG 24 hr tablet Take 10 mg by mouth daily.     fluticasone (FLONASE) 50 MCG/ACT nasal spray Place into the nose.     Folic Acid-Vit [id number] (VIRT-GARD) 2.2-25-1 MG TABS Take 1 tablet by mouth daily.     levothyroxine (SYNTHROID, LEVOTHROID) 125 MCG tablet Take 125 mcg by mouth every other day.   2   levothyroxine (SYNTHROID, LEVOTHROID) 137 MCG tablet TK 1 T PO EVERY MORNING BEFORE BREAKFAST ALTERNATING WITH 125MCG T  1   losartan (COZAAR) 100 MG tablet losartan 100 mg tablet     metoprolol succinate (TOPROL-XL) 50 MG 24 hr tablet Take 1 tablet (50 mg total) by mouth daily. [place]  tablet 3   Multiple Vitamin (GNP ESSENTIAL ONE DAILY) TABS Take by mouth daily.      venlafaxine XR (EFFEXOR-XR) 75 MG 24 hr capsule Take 1 tablet by mouth daily.     furosemide (LASIX) 20 MG tablet Take 20 mg by mouth daily as needed.      Current Facility-Administered Medications  Medication Dose Route Frequency Provider Last Rate Last Admin   0.9 %  sodium chloride infusion  500 mL Intravenous Once [place], MD        Family History  Problem Relation Age of Onset   Stroke Mother    Hypertension Mother    Heart disease Mother    Arthritis Mother    Kidney disease Mother    Hypertension Father    Heart disease Father    Arthritis Father    Hypertension Brother    Heart attack Brother    Heart disease Brother    Arthritis Brother    Thyroid cancer Son     Review of Systems  All other systems reviewed and are negative.  Exam:   BP  126/72   Pulse 70   Ht 5' 2.5" (1.588 m)   Wt 150 lb 9.6 oz (68.3 kg)   SpO2 98%   BMI 27.11 kg/m   Weight change: '@WEIGHTCHANGE'$ @ Height:   Height: 5' 2.5" (158.8 cm)  Ht Readings from Last 3 Encounters:  09/20/20 5' 2.5" (1.588 m)  09/13/20 '5\' 2"'$  (1.575 m)  08/18/20 '5\' 2"'$  (1.575 m)    General appearance: alert, cooperative and appears stated age Head: Normocephalic, without obvious abnormality, atraumatic Neck: no adenopathy, supple, symmetrical, trachea midline and thyroid normal to inspection and palpation Breasts: normal appearance, no masses or tenderness Abdomen: soft, non-tender; non distended,  no masses,  no organomegaly Extremities: extremities normal, atraumatic, no cyanosis or edema Skin: Skin color, texture, turgor normal. No rashes or lesions Lymph nodes: Cervical, supraclavicular, and axillary nodes normal. No abnormal inguinal nodes palpated Neurologic: Grossly normal   Pelvic: External genitalia:  no lesions              Urethra:  normal appearing urethra with no masses, tenderness or lesions              Bartholins and Skenes: normal                 Vagina: atrophic appearing vagina with a moderate grade 2 rectocele              Cervix: absent               Bimanual Exam:  Uterus:  uterus absent              Adnexa: no mass, fullness, tenderness               Rectovaginal: declined  Gae Dry chaperoned for the exam.  1. Encounter for gynecological examination without abnormal finding Mammogram scheduled Colonoscopy UTD Breast self awareness discussed Labs with Dr Forde Dandy F/U in 2 years  2. History of osteopenia Discussed calcium, vit d, exercise - DG Bone Density; Future  3. Hypoestrogenism - DG Bone Density; Future  4. Rectocele Needing to reduce the rectocele at times to empty. Denies constipation.  Discussed option of trying a pessary and surgery is symptoms are bothersome. Avoid straining

## 2020-10-19 ENCOUNTER — Ambulatory Visit: Payer: Medicare Other

## 2020-10-19 DIAGNOSIS — H9193 Unspecified hearing loss, bilateral: Secondary | ICD-10-CM | POA: Insufficient documentation

## 2020-10-19 DIAGNOSIS — L299 Pruritus, unspecified: Secondary | ICD-10-CM | POA: Insufficient documentation

## 2020-10-20 ENCOUNTER — Ambulatory Visit
Admission: RE | Admit: 2020-10-20 | Discharge: 2020-10-20 | Disposition: A | Discharge status: Home / Self Care | Payer: Medicare Other | Source: Ambulatory Visit | Attending: Obstetrics and Gynecology | Admitting: Obstetrics and Gynecology

## 2020-10-20 ENCOUNTER — Other Ambulatory Visit: Payer: Self-pay

## 2020-10-20 DIAGNOSIS — Z1231 Encounter for screening mammogram for malignant neoplasm of breast: Secondary | ICD-10-CM

## 2020-10-28 ENCOUNTER — Other Ambulatory Visit: Payer: Self-pay | Admitting: Family

## 2020-10-28 DIAGNOSIS — I491 Atrial premature depolarization: Secondary | ICD-10-CM

## 2020-10-28 DIAGNOSIS — I10 Essential (primary) hypertension: Secondary | ICD-10-CM

## 2020-10-30 NOTE — Telephone Encounter (Signed)
Rx(s) sent to pharmacy electronically.  

## 2020-12-12 ENCOUNTER — Other Ambulatory Visit: Payer: Self-pay

## 2020-12-12 ENCOUNTER — Ambulatory Visit
Admission: RE | Admit: 2020-12-12 | Discharge: 2020-12-12 | Disposition: A | Discharge status: Home / Self Care | Payer: Medicare Other | Source: Ambulatory Visit | Attending: Obstetrics and Gynecology | Admitting: Obstetrics and Gynecology

## 2020-12-12 DIAGNOSIS — E2839 Other primary ovarian failure: Secondary | ICD-10-CM

## 2020-12-12 DIAGNOSIS — Z8739 Personal history of other diseases of the musculoskeletal system and connective tissue: Secondary | ICD-10-CM

## 2020-12-25 ENCOUNTER — Ambulatory Visit (INDEPENDENT_AMBULATORY_CARE_PROVIDER_SITE_OTHER): Payer: Medicare Other | Admitting: Obstetrics and Gynecology

## 2020-12-25 ENCOUNTER — Other Ambulatory Visit: Payer: Self-pay

## 2020-12-25 VITALS — BP 140/80 | HR 64 | Ht 62.5 in | Wt 150.8 lb

## 2020-12-25 DIAGNOSIS — M810 Age-related osteoporosis without current pathological fracture: Secondary | ICD-10-CM

## 2020-12-25 NOTE — Patient Instructions (Signed)

## 2020-12-25 NOTE — Progress Notes (Signed)
GYNECOLOGY  VISIT   HPI: 75 y.o.   Married White or Caucasian Not Hispanic or Latino  female   G1P1001 with No LMP recorded. Patient has had a hysterectomy.   here for consult about Dexa results.  DEXA from 12/12/20 returned with a T score of -2.3. FRAX 15.2/4.4%.  Recent lab work with Dr Forde Dandy  She exercises in the gyn one day a week and walks 3-4 days a week.  GYNECOLOGIC HISTORY: No LMP recorded. Patient has had a hysterectomy. Contraception: PMP Menopausal hormone therapy: none         OB History     Gravida  1   Para  1   Term  1   Preterm  0   AB  0   Living  1      SAB  0   IAB  0   Ectopic  0   Multiple  0   Live Births  1              Patient Active Problem List   Diagnosis Date Noted   Peripheral vascular disease ([place]) 09/20/2020   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   Cardiac arrhythmia 07/30/2019   Dysuria 07/30/2019   Spasm 07/30/2019   Anxiety 06/09/2019   Essential hypertension 06/09/2019   Greater trochanteric pain syndrome 06/09/2019   Hardening of the aorta (main artery of the heart) ([place]) 05/31/2019   Benign neoplasm of colon 05/31/2019   Major depression, single episode 05/31/2019   Other specified diseases of blood and blood-forming organs 05/24/2019   Dyspnea 12/18/2018   Disorder of bone 05/27/2018   Urinary tract infectious disease 05/27/2018   Impacted cerumen of both ears 05/13/2017   Impacted cerumen 04/16/2017   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[place]) 04/16/2017   Encounter for general adult medical examination without abnormal findings 04/09/2017   Acute bronchitis 01/21/2017   Abnormal glucose tolerance test 12/03/2016   Asthma without status asthmaticus 12/03/2016   Migraine 12/03/2016   Carpal tunnel syndrome 01/31/2016   Paresthesia of hand 01/31/2016   Atrophic vaginitis 05/18/2014   Rectocele 05/18/2014   History of CVA (cerebrovascular accident) 12/29/2013    Impaired fasting glucose 12/29/2013   Family history of diabetes mellitus 01/13/2012   Idiopathic peripheral neuropathy 01/13/2012   Cerebral infarction ([place]) 11/19/2011   Mixed hyperlipidemia 11/19/2011   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   Unilateral primary osteoarthritis, unspecified knee 11/19/2011   Stroke ([place]) 09/04/2005    Past Medical History:  Diagnosis Date   Anxiety    Arthritis    Colon polyp    CVA (cerebral vascular accident) ([place]) 2006   High cholesterol    Hypertension    Hypothyroid    surgical   Melanoma ([place])    Rectocele     Past Surgical History:  Procedure Laterality Date   BREAST BIOPSY Right    X 2   BREAST BIOPSY Left    BREAST SURGERY     BX breast lumps X 3   COLONOSCOPY     PARTIAL HYSTERECTOMY     REPLACEMENT TOTAL KNEE Bilateral    THYROIDECTOMY      Current Outpatient Medications  Medication Sig Dispense Refill   atorvastatin (LIPITOR) 20 MG tablet atorvastatin 20 mg tablet     Calcium Carbonate-Vit D-Min (CALCIUM 1200 PO) Take by mouth daily.     dipyridamole-aspirin (AGGRENOX) 200-25 MG 12hr capsule Take 1 capsule by mouth 2 (two) times daily.  felodipine (PLENDIL) 10 MG 24 hr tablet Take 10 mg by mouth daily.     Folic Acid-Vit H4-VQQ V95 (VIRT-GARD) 2.2-25-1 MG TABS Take 1 tablet by mouth daily.     furosemide (LASIX) 20 MG tablet Take 20 mg by mouth daily as needed.      levothyroxine (SYNTHROID, LEVOTHROID) 125 MCG tablet Take 125 mcg by mouth every other day.   2   levothyroxine (SYNTHROID, LEVOTHROID) 137 MCG tablet TK 1 T PO EVERY MORNING BEFORE BREAKFAST ALTERNATING WITH 125MCG T  1   losartan (COZAAR) 100 MG tablet losartan 100 mg tablet     metoprolol succinate (TOPROL-XL) 50 MG 24 hr tablet Take 1 tablet (50 mg total) by mouth daily. Need appointment 30 tablet 0   Multiple Vitamin (GNP ESSENTIAL ONE DAILY) TABS Take by mouth daily.      venlafaxine XR (EFFEXOR-XR) 75 MG 24 hr capsule Take 1 tablet by mouth  daily.     Current Facility-Administered Medications  Medication Dose Route Frequency Provider Last Rate Last Admin   0.9 %  sodium chloride infusion  500 mL Intravenous Once Thornton Park, MD         ALLERGIES: Codeine, Penicillins, and Sulfa antibiotics  Family History  Problem Relation Age of Onset   Stroke Mother    Hypertension Mother    Heart disease Mother    Arthritis Mother    Kidney disease Mother    Hypertension Father    Heart disease Father    Arthritis Father    Hypertension Brother    Heart attack Brother    Heart disease Brother    Arthritis Brother    Thyroid cancer Son     Social History   Socioeconomic History   Marital status: Married    Spouse name: Not on file   Number of children: Not on file   Years of education: Not on file   Highest education level: Not on file  Occupational History   Not on file  Tobacco Use   Smoking status: Former    Packs/day: 0.50    Years: 12.00    Pack years: 6.00    Types: Cigarettes    Quit date: 02/04/1978    Years since quitting: 42.9   Smokeless tobacco: Never  Vaping Use   Vaping Use: Never used  Substance and Sexual Activity   Alcohol use: Yes    Comment: occassional beer with pizza   Drug use: No   Sexual activity: Not Currently    Partners: Male    Birth control/protection: Post-menopausal  Other Topics Concern   Not on file  Social History Narrative   Not on file   Social Determinants of Health   Financial Resource Strain: Not on file  Food Insecurity: Not on file  Transportation Needs: Not on file  Physical Activity: Not on file  Stress: Not on file  Social Connections: Not on file  Intimate Partner Violence: Not on file    ROS  PHYSICAL EXAMINATION:    BP 140/80   Pulse 64   Ht 5' 2.5" (1.588 m)   Wt 150 lb 12.8 oz (68.4 kg)   SpO2 98%   BMI 27.14 kg/m     General appearance: alert, cooperative and appears stated age  Labs from Dr [place] from 05/26/20 reviewed: Normal  calcium, renal function, liver function, normal CBC, normal lipids, vit d 32.7, TSH 0.22  1. Age-related osteoporosis without current pathological fracture Discussed her DEXA results Discussed recommendations for calcium,  vit d and exercise Discussed the importance of working on balance Discussed trying to make her house as fall proof as possible - alendronate (FOSAMAX) 70 MG tablet; Take 1 tablet (70 mg total) by mouth every 7 (seven) days. Take first thing in am with 6 oz. Water.  Be upright after taking.  Eat nothing for one hour.  Dispense: 12 tablet; Refill: 3 - Magnesium; Future - Phosphorus; Future

## 2020-12-26 ENCOUNTER — Telehealth: Payer: Self-pay | Admitting: Obstetrics and Gynecology

## 2020-12-26 ENCOUNTER — Encounter: Payer: Self-pay | Admitting: Obstetrics and Gynecology

## 2020-12-26 MED ORDER — ALENDRONATE SODIUM 70 MG PO TABS: 70.0000 mg | ORAL_TABLET | ORAL | 3 refills | Status: DC

## 2020-12-26 NOTE — Telephone Encounter (Signed)
Please let the patient know that I did get and review her labs from Dr Forde Dandy. I have placed future orders for 2 more labs, please schedule her for a lab visit (doesn't need to be fasting). Please also let her know that I have called in the prescription for the fosamax for her.

## 2020-12-27 NOTE — Telephone Encounter (Signed)
Patient informed with below, lab appointment scheduled on 01/08/21 @ 11:00am

## 2021-01-08 ENCOUNTER — Other Ambulatory Visit: Payer: Self-pay

## 2021-01-08 ENCOUNTER — Other Ambulatory Visit: Payer: Medicare Other

## 2021-01-08 DIAGNOSIS — M810 Age-related osteoporosis without current pathological fracture: Secondary | ICD-10-CM

## 2021-01-08 LAB — MAGNESIUM: Magnesium: 2 mg/dL (ref 1.5–2.5)

## 2021-01-08 LAB — PHOSPHORUS: Phosphorus: 3.5 mg/dL (ref 2.1–4.3)

## 2021-01-16 ENCOUNTER — Telehealth: Payer: Self-pay

## 2021-01-16 NOTE — Telephone Encounter (Signed)
Yes, please have her start the Fosamax.

## 2021-01-16 NOTE — Telephone Encounter (Signed)
Pt called requesting to know if it was ok to go ahead and start taking the fosomax now that her BW had come back WNL. Please advise.

## 2021-01-17 NOTE — Telephone Encounter (Signed)
Pt notified and voiced understanding 

## 2021-01-19 ENCOUNTER — Encounter: Payer: Self-pay | Admitting: Cardiovascular Disease

## 2021-01-19 ENCOUNTER — Ambulatory Visit (INDEPENDENT_AMBULATORY_CARE_PROVIDER_SITE_OTHER): Payer: Medicare Other | Admitting: Cardiovascular Disease

## 2021-01-19 ENCOUNTER — Other Ambulatory Visit: Payer: Self-pay

## 2021-01-19 VITALS — BP 146/76 | HR 76 | Ht 61.0 in | Wt 150.8 lb

## 2021-01-19 DIAGNOSIS — I10 Essential (primary) hypertension: Secondary | ICD-10-CM

## 2021-01-19 DIAGNOSIS — I700 Atherosclerosis of aorta: Secondary | ICD-10-CM | POA: Diagnosis not present

## 2021-01-19 DIAGNOSIS — I491 Atrial premature depolarization: Secondary | ICD-10-CM | POA: Diagnosis not present

## 2021-01-19 MED ORDER — HYDROCHLOROTHIAZIDE 25 MG PO TABS: 25.0000 mg | ORAL_TABLET | Freq: Every day | ORAL | 3 refills | Status: DC

## 2021-01-19 NOTE — Progress Notes (Signed)
Cardiology Office Note:    Date:  01/19/2021   ID:  Rachel Donovan, DOB 05/11/1945, MRN [id number]  PCP:  Reynold Bowen, MD   [facility] Providers Cardiologist:  Ida Rogue, MD     Referring MD: Reynold Bowen, MD   Chief Complaint  Patient presents with   Coronary Artery Disease     History of Present Illness:    Rachel Donovan is a 75 y.o. female with a hx of history of CVA, hyperlipidemia and coronary artery calcifications.  She is previously been seen by Dr. Rockey Situ in 2020.  Hx of HLD , hypothyroidism   Coronary calcium score of 103. This was 85 percentile for age and sex matched control.  CVA in 2006.  Possibly a TIA  - is on Aggrenox Occasional episodes of CP behind both breasts Gets some exercise Cp only lasts for seconds  Goes to a class on mondays .   Exercise  does not bring on these cp   BP is a bit elevated.  Eats salt Takes furosemide on occasion Has some ankle edema at night .  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 Take first thing in am with 6 oz. Water.  Be upright after taking.  Eat nothing for one hour.   atorvastatin (LIPITOR) 20 MG tablet atorvastatin 20 mg tablet   Calcium Carbonate-Vit D-Min (CALCIUM 1200 PO) Take by mouth daily.   dipyridamole-aspirin (AGGRENOX) 200-25 MG 12hr capsule Take 1 capsule by mouth 2 (two) times daily.    felodipine (PLENDIL) 10 MG 24 hr tablet Take 10 mg by mouth daily.   Folic Acid-Vit D1-SHF W26 (FABB) 2.2-25-1 MG TABS Take 1 tablet by mouth daily.   furosemide (LASIX) 20 MG tablet Take 20 mg by mouth daily as needed.    hydrochlorothiazide (HYDRODIURIL) 25 MG tablet Take 1 tablet (25 mg total) by mouth daily.   levothyroxine (SYNTHROID, LEVOTHROID) 125 MCG tablet Take 125 mcg by mouth every other day.    levothyroxine (SYNTHROID, LEVOTHROID) 137 MCG  tablet TK 1 T PO EVERY MORNING BEFORE BREAKFAST ALTERNATING WITH 125MCG T   losartan (COZAAR) 100 MG tablet losartan 100 mg tablet   metoprolol succinate (TOPROL-XL) 50 MG 24 hr tablet Take 1 tablet (50 mg total) by mouth daily. Need appointment   Multiple Vitamin (GNP ESSENTIAL ONE DAILY) TABS Take by mouth daily.    venlafaxine XR (EFFEXOR-XR) 75 MG 24 hr capsule Take 1 tablet by mouth daily.   Current Facility-Administered Medications for the 01/19/21 encounter (Office Visit) with Emylia Latella, Wonda Cheng, MD  Medication   0.9 %  sodium chloride infusion     Allergies:   Codeine, Penicillins, and Sulfa antibiotics   Social History   Socioeconomic History   Marital status: Married    Spouse name: Not on file   Number of children: Not on file   Years of education: Not on file   Highest education level: Not on file  Occupational History   Not on file  Tobacco Use   Smoking status: Former    Packs/day: 0.50    Years: 12.00    Pack  years: 6.00    Types: Cigarettes    Quit date: 02/04/1978    Years since quitting: 42.9   Smokeless tobacco: Never  Vaping Use   Vaping Use: Never used  Substance and Sexual Activity   Alcohol use: Yes    Comment: occassional beer with pizza   Drug use: No   Sexual activity: Not Currently    Partners: Male    Birth control/protection: Post-menopausal  Other Topics Concern   Not on file  Social History Narrative   Not on file   Social Determinants of Health   Financial Resource Strain: Not on file  Food Insecurity: Not on file  Transportation Needs: Not on file  Physical Activity: Not on file  Stress: Not on file  Social Connections: Not on file    Family History: The patient's family history includes Arthritis in her brother, father, and mother; Heart attack in her brother; Heart disease in her brother, father, and mother; Hypertension in her brother, father, and mother; Kidney disease in her mother; Stroke in her mother; Thyroid cancer in her son.  ROS:   Please see the history of present illness.     All other systems reviewed and are negative.  EKGs/Labs/Other Studies Reviewed:    The following studies were reviewed today:  EKG:   Dec. 16, 2022:   NSR .  No ST or T wave changes.   Recent Labs: 01/08/2021: Magnesium 2.0  Recent Lipid Panel No results found for: CHOL, TRIG, HDL, CHOLHDL, VLDL, LDLCALC, LDLDIRECT   Risk Assessment/Calculations:     Physical Exam:    VS:  BP (!) 146/76 (BP Location: Left Arm, Patient Position: Sitting, Cuff Size: Normal)    Pulse 76    Ht 5\' 1"  (1.549 m)    Wt 150 lb 12.8 oz (68.4 kg)    SpO2 99%    BMI 28.49 kg/m     Wt Readings from Last 3 Encounters:  01/19/21 150 lb 12.8 oz (68.4 kg)  12/25/20 150 lb 12.8 oz (68.4 kg)  09/20/20 150 lb 9.6 oz (68.3 kg)     GEN:  elderly female, NAD  HEENT: Normal NECK: No JVD; No carotid bruits LYMPHATICS: No lymphadenopathy CARDIAC: RRR, no murmurs, rubs, gallops RESPIRATORY:  Clear  to auscultation without rales, wheezing or rhonchi  ABDOMEN: Soft, non-tender, non-distended MUSCULOSKELETAL:  No edema; No deformity  SKIN: Warm and dry  NEUROLOGIC:  Alert and oriented x 3 PSYCHIATRIC:  Normal affect   ASSESSMENT:    1. Essential hypertension   2. PAC (premature atrial contraction)   3. Hardening of the aorta (main artery of the heart) ([place])    PLAN:       Hypertension: Freda Munro presents for further evaluation and management of her hypertension.  Blood pressure is elevated today.  She admits that she eats too much salt.  She frequently eats out for most meals.  We will add hydrochlorothiazide 25 mg a day.  Her last potassium level was fairly high and so we will not add potassium supplement at this point.  We will recheck a basic metabolic profile in 1 to 2 weeks.  We will have her return to see our hypertension clinic in approximately 2 months.  I have advised her to work on reducing the salt in her diet.  She needs to increase her exercise.  2.  Hyperlipidemia: This is being managed by Dr. Forde Dandy.  3.  Coronary artery calcifications: She has mild coronary artery calcifications.  She does not have any episodes of angina.       Medication Adjustments/Labs and Tests Ordered: Current medicines are reviewed at length with the patient today.  Concerns regarding medicines are outlined above.  Orders Placed This Encounter  Procedures   Basic metabolic panel   AMB Referral to [facility] Pharm-D   EKG 12-Lead    Meds ordered this encounter  Medications   hydrochlorothiazide (HYDRODIURIL) 25 MG tablet    Sig: Take 1 tablet (25 mg total) by mouth daily.    Dispense:  90 tablet    Refill:  3     Patient Instructions  Medication Instructions:  Your physician has recommended you make the following change in your medication: 1) START taking hydrochlorothiazide 25 mg daily   *If you need a refill on your cardiac medications before your next appointment, please call your  pharmacy*  Lab Work: Come back for BMET in 1-2 weeks  If you have labs (blood work) drawn today and your tests are completely normal, you will receive your results only by: [place] (if you have MyChart) OR A paper copy in the mail If you have any lab test that is abnormal or we need to change your treatment, we will call you to review the results.  Follow-Up: At [facility], you and your health needs are our priority.  As part of our continuing mission to provide you with exceptional heart care, we have created designated Provider Care Teams.  These Care Teams include your primary Cardiologist (physician) and Advanced Practice Providers (APPs -  Physician Assistants and Nurse Practitioners) who all work together to provide you with the care you need, when you need it.  Follow up with PharmD in the Hypertension Clinic in 2 months  Your next appointment:   6 month(s)  The format for your next appointment:   In Person  Provider:  Dr. Acie Fredrickson or APP    Signed, Mertie Moores, MD  01/19/2021 11:29 AM    [place]

## 2021-01-19 NOTE — Patient Instructions (Signed)
Medication Instructions:  Your physician has recommended you make the following change in your medication: 1) START taking hydrochlorothiazide 25 mg daily   *If you need a refill on your cardiac medications before your next appointment, please call your pharmacy*  Lab Work: Come back for BMET in 1-2 weeks  If you have labs (blood work) drawn today and your tests are completely normal, you will receive your results only by: [place] (if you have MyChart) OR A paper copy in the mail If you have any lab test that is abnormal or we need to change your treatment, we will call you to review the results.  Follow-Up: At [facility], you and your health needs are our priority.  As part of our continuing mission to provide you with exceptional heart care, we have created designated Provider Care Teams.  These Care Teams include your primary Cardiologist (physician) and Advanced Practice Providers (APPs -  Physician Assistants and Nurse Practitioners) who all work together to provide you with the care you need, when you need it.  Follow up with PharmD in the Hypertension Clinic in 2 months  Your next appointment:   6 month(s)  The format for your next appointment:   In Person  Provider:  Dr. Acie Fredrickson or APP

## 2021-01-24 ENCOUNTER — Ambulatory Visit (INDEPENDENT_AMBULATORY_CARE_PROVIDER_SITE_OTHER): Payer: Medicare Other | Admitting: Podiatry

## 2021-01-24 ENCOUNTER — Encounter: Payer: Self-pay | Admitting: Podiatry

## 2021-01-24 ENCOUNTER — Ambulatory Visit: Payer: Medicare Other

## 2021-01-24 ENCOUNTER — Other Ambulatory Visit: Payer: Self-pay

## 2021-01-24 DIAGNOSIS — M778 Other enthesopathies, not elsewhere classified: Secondary | ICD-10-CM

## 2021-01-24 DIAGNOSIS — G5781 Other specified mononeuropathies of right lower limb: Secondary | ICD-10-CM

## 2021-01-24 DIAGNOSIS — G5782 Other specified mononeuropathies of left lower limb: Secondary | ICD-10-CM

## 2021-01-24 DIAGNOSIS — M19071 Primary osteoarthritis, right ankle and foot: Secondary | ICD-10-CM | POA: Diagnosis not present

## 2021-01-24 DIAGNOSIS — M19072 Primary osteoarthritis, left ankle and foot: Secondary | ICD-10-CM

## 2021-01-24 NOTE — Progress Notes (Signed)
She presents today for follow-up of her neuroma third interdigital space bilaterally states that she still having issues and she is concerned about the discoloration of the ankles.  Objective: Vital signs are stable alert and oriented x3.  Pulses are palpable.  She has larger ankles with some swelling and some spider veins and discoloration in the medial ankle and the dorsum of the foot.  This is nothing to worry about there are no open lesions or wounds.  She still has tenderness on palpation to the third interdigital space bilaterally and pain on end range of motion of the second third toes bilaterally left greater than right.  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  Plan: We discussed injection therapy today which she declined we also discussed injection therapy revert referring to dehydrated alcohol and she declined as well were going to offer her physical therapy at this point and if we need to follow-up with orthotics at some point we can do that we did the discuss surgery briefly.

## 2021-01-31 ENCOUNTER — Ambulatory Visit: Payer: Medicare Other | Admitting: Physical Therapy

## 2021-02-01 ENCOUNTER — Other Ambulatory Visit: Payer: Medicare Other

## 2021-02-07 ENCOUNTER — Encounter: Payer: Medicare Other | Admitting: Physical Therapy

## 2021-02-12 ENCOUNTER — Ambulatory Visit: Payer: Medicare Other | Attending: Podiatry | Admitting: Physical Therapy

## 2021-02-12 ENCOUNTER — Encounter: Payer: Self-pay | Admitting: Physical Therapy

## 2021-02-12 DIAGNOSIS — M79671 Pain in right foot: Secondary | ICD-10-CM | POA: Insufficient documentation

## 2021-02-12 DIAGNOSIS — M79672 Pain in left foot: Secondary | ICD-10-CM | POA: Diagnosis present

## 2021-02-12 DIAGNOSIS — R262 Difficulty in walking, not elsewhere classified: Secondary | ICD-10-CM | POA: Diagnosis present

## 2021-02-12 DIAGNOSIS — M6281 Muscle weakness (generalized): Secondary | ICD-10-CM | POA: Insufficient documentation

## 2021-02-12 NOTE — Therapy (Signed)
[facility] [address], [place], [address] Phone: [phone]   Fax:  [phone]  Physical Therapy Evaluation  Patient Details  Name: Rachel Donovan MRN: [id number] Date of Birth: 09/04/1945 Referring Provider (PT): [place], [place] T, [place]   Encounter Date: 02/12/2021   PT End of Session - 02/12/21 1207     Visit Number 1    Number of Visits 24    Date for PT Re-Evaluation 05/07/21    Authorization Type MEDICARE PART B reporting period from 02/12/2021    Progress Note Due on Visit 10    PT Start Time 1100    PT Stop Time 1200    PT Time Calculation (min) 60 min    Activity Tolerance Patient tolerated treatment well    Behavior During Therapy [facility] for tasks assessed/performed             Past Medical History:  Diagnosis Date   Anxiety    Aortic atherosclerosis ([place])    Arthritis    Benign neoplasm of colon, unspecified    Colon polyp    CVA (cerebral vascular accident) ([place]) 2006   Depression    High cholesterol    Hip fracture ([place])    Hyperlipidemia    Hyperplastic colon polyp    Hypertension    Hypothyroid    surgical   IGT (impaired glucose tolerance)    Melanoma ([place])    Migraines    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    Subjective Assessment - 02/12/21 1106     Subjective Patient states she always worked and she wore heels. She has arthritis in her toes. She states she has to be very careful which  shoes she wears and she has to wear bigger shoes. She has problems doing anything on her feet for a long time. Her pain just kind of happened over a period of time. She thinks it has been bothering her for 2-3 years. She thinks it started after she moved to [place] about 4 years ago. She states it bothers her the most that it affects her exercise (walking, exercise classes at the gym). She has noticed a recent aggravation of symptoms. Her podiatrist gave her a choice of more cortisone shots or PT and this time she elected for physical therapy. She states the cortisone shots have given her relief for 3-4 months but she doesn't want to keep putting that in her body. She does not remember when she had the last round of injections but thinks it may have been over the summer. She does currently have pain when she is sitting but last had pain when she stood up. She doesn't wear shoes at all at home because if she doesn't put on shoes she feels pretty good. She thinks  the left is worse than the right and probably bothers her more. Denies back problems that she is aware of.    Pertinent History Patient is a 76 y.o. female who presents to outpatient physical therapy with a referral for medical diagnosis neuroma of third interspace of left foot, neuroma of third interspace of right foot, capsulitis of foot, unspecified laterality, osteoarthritis of joints of toes of both feet. This patient's chief complaints consist of bilateral foot pain leading to the following functional deficits: difficulty with any weight bearing activities including walking, going out of the house, exercise routines (walking, exercise classes at the gym), shopping. Her condition also decreases her balance and increases fear of falling. Relevant past medical history and comorbidities include history of strake (2006, some residual numbness left hand), HTN, PVD, post-op hypothyroidism, carpal tunnel syndrome, osteoporosis, melanoma (4 years ago -  currently being checked every 6 months), B TKA, R wrist surgery, former smoker, anxiety/depression (satisfied with current care).  Patient denies hx of seizures, lung problem, major cardiac events, diabetes, unexplained weight loss, changes in bowel or bladder problems, new onset stumbling or dropping things.    Limitations Standing;Walking;House hold activities;Other (comment)   Functional Limitations: difficulty with any weight bearing activities including walking, going out of the house, exercise routines (walking, exercise classes at the gym), shopping. Her condition also decreases her balance and increases fear of falling.   How long can you sit comfortably? not limited    How long can you stand comfortably? 1 hour?    How long can you walk comfortably? She walkes through it up to 30-45 min.    Diagnostic tests B foot radiograph description in provider's note from 06/09/2019: "Radiographs taken today primarily left foot demonstrates an osseously mature individual with the hammertoe deformities. Contralateral foot demonstrates very minimal changes. She also has a lateral dislocation at the third metatarsophalangeal joint left foot." DXA Bone Density report 12/12/2020: " ASSESSMENT:  The BMD measured at Femur Total Left is 0.719 g/cm2 with a T-score  of -2.3. This patient is considered osteopenic/low bone mass  according to [facility]) criteria."    Patient Stated Goals "keep moving as long as I can"    Currently in Pain? No/denies    Pain Score 0-No pain   W: 4-5/10; B: 0/10   Pain Location Foot   B dorsal aspect of both feet at distal metatarsals, bilateral toes, L foot on ball of foot.   Pain Orientation Right;Left    Pain Descriptors / Indicators Aching;Sore   denies numbness/burning. Endorses cramps   Pain Type Chronic pain    Pain Radiating Towards denies,    Pain Onset More than a month ago    Pain Frequency Intermittent    Aggravating Factors  wearing shoes, weight  bearing, tight shoes, the more active she is the more it bothers her    Pain Relieving Factors not weight bearing, not wearing shoes, bigger shoes, stretchy/light shoes    Effect of Pain on Daily Activities Functional Limitations: difficulty with any weight bearing activities including walking, going out of the house, exercise routines (walking, exercise classes at the gym), shopping, wearing shoes. Her condition also decreases her balance and increases fear of falling.              [facility] PT Assessment - 02/13/21 0001       Assessment   Medical Diagnosis neuroma of third interspace of left foot, neuroma of third interspace of right foot, capsulitis of foot,  unspecified laterality, osteoarthritis of joints of toes of both feet    Referring Provider (PT) Hyatt, Max T, DPM    Onset Date/Surgical Date --   about 3 years ago   Next MD Visit none scheduled    Prior Therapy none for this problem prior to current episode of care      Precautions   Precautions None      Restrictions   Weight Bearing Restrictions No      Balance Screen   Has the patient fallen in the past 6 months No    Has the patient had a decrease in activity level because of a fear of falling?  No    Is the patient reluctant to leave their home because of a fear of falling?  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  Anthropometrics Tremor:  none Body composition: BMI 28.3 Edema: B LE edema noted Pulses:  normal L dorsalis pedis artery but unable to palpate R or B posterior tibialis artery.  Functional Mobility Bed mobility: sit to long sitting I Transfers: sit <> stand I Gait: grossly WFL for household and short community ambulation. More detailed gait analysis deferred to later date as needed. When barefoot noted for bilateral toe extension allowing minimal pressure through toes.   LUMBAR SPINE SCREEN Lumbar AROM with overpressure did not produce concordant pain in flexion, extension, sidebending, or rotation.   NEUROLOGICAL Sensation: decreased to light touch at left lateral foot, decreased at medial aspect of 4th digit bilaterally  PERIPHERAL JOINT MOTION (in degrees)  Active Range of Motion (AROM) *Indicates pain 02/12/21 Date Date  Joint/Motion R/L R/L R/L  Ankle/Foot     Dorsiflexion (knee ext) 8/8 / /  Plantarflexion 70/60 / /  Everison 10/40 / /  Inversion 34/40 / /   Passive Range of Motion (PROM) *Indicates pain 02/13/20 Date Date  Joint/Motion R/L R/L R/L  Ankle/Foot     Dorsiflexion (knee ext) 23/25 / /  Dorsiflexion (knee flex) 38/35 / /  Great toe extension 116/110 / /  Comments: dorsiflexion performed in close chain position with inclinometer.   MUSCLE PERFORMANCE (MMT):  *Indicates pain 02/12/21 Date Date  Joint/Motion R/L R/L R/L  Knee     Extension (L3) 5/4+ / /  Flexion (S2) 4+/4 / /  Ankle/Foot     Dorsiflexion (L4) 5/4+ / /  Great toe extension (L5) 5/4+ / /  Eversion (S1) 4+/4 / /  Plantarflexion (S1) 4+/4+ / /  Inversion 4/4 / /  Great toe flexion 4+/4 / /  Comments: able to splay toes with difficulty. Demonstrates difficulty with pressing great toe into floor (flexion) while lifting lesser toes and vice versa (lesser toes curl).   SPECIAL TESTS: FOOT SPECIAL TESTS Web-space tenderness test: positive B 3rd intermetatarsal space and at 4th intermetatarsal space on left. (Implicates  neuroma at that space) Squeeze test: positive in 3rd intermetatarsal space bilaterally and at 4th intermetatarsal space on left (negative for Mulders click). (Implicates neuroma at positive spaces).   Plantar percussion test/Tinel sign: negative bilaterally  Toe-tip numbness: positive for slight change in sensation at bilateral medial 4th toe. (Relevance unclear).   ACCESSORY MOTION:  Painful (concordant) to PA and AP mobs at B 3rd metatarsophalangeal joints, less so at 4th MTP joints.   PALPATION: TTP along joints and intermetatarsal spaces close to 3rd and 3th MTP joints.   Objective measurements completed on examination: See above findings.    TREATMENT:   Therapeutic exercise: to centralize symptoms and improve ROM, strength, muscular endurance, and activity tolerance required for successful completion of functional activities.  - seated toe splays, repeated 5 sec holds. Ball of foot and heel maintains contact with floor. To improve intrinsic foot muscle activation and strength in order to better support arch and intrinsic foot structures. 1x10 both feet.  - seated toe yoga: great toe extension with small toes flexion pressure into floor, repeated 3 sec holds; small toes extension with great toe flexion pressure into floor, repeated 4 sec holds. Ball of foot and heel maintains contact with floor. To improve intrinsic foot muscle activation and strength in order to better support arch and intrinsic foot structures. 1x10 each direction each foot - Education on HEP including handout  - Education on diagnosis, prognosis, POC, anatomy and physiology of current condition.   Pt required multimodal cuing for proper technique and to facilitate improved neuromuscular control, strength, range of motion, and functional ability resulting in improved performance and form.  HOME EXERCISE PROGRAM Access Code: [email or web address] URL: [email or web address] Date: 02/12/2021 Prepared by: Rosita Kea  Exercises Toe Spreading - 1 x daily - 1 sets - 20 reps - 4 seconds hold Seated Lesser Toes Extension - 1 x daily - 1 sets - 20 reps - 4 seconds hold Seated Great Toe Extension - 1 x daily - 1 sets - 20 reps - 4 seconds hold     PT Education - 02/12/21 1715     Education Details Exercise purpose/form. Self management techniques. Education on diagnosis, prognosis, POC, anatomy and physiology of current condition Education on HEP including handout    Person(s) Educated Patient    Methods Explanation;Demonstration;Tactile cues;Verbal cues;Handout    Comprehension Verbalized understanding;Returned demonstration;Verbal cues required;Tactile cues required;Need further instruction              PT Short Term Goals - 02/13/21 0931       PT SHORT TERM GOAL #1   Title Be independent with initial home exercise program for self-management of symptoms.    Baseline Initial HEP provided at IE (02/12/2021);    Time 2    Period Weeks    Status New    Target Date 02/27/21               PT Long Term Goals - 02/13/21 0932       PT LONG TERM GOAL #1   Title Be independent with a long-term home exercise program for self-management of symptoms.    Baseline Initial HEP provided at IE (02/12/2021);    Time 12    Period Weeks    Status New   TARGET DATE FOR ALL LONG TERM GOALS: 05/07/2021     PT LONG TERM GOAL #2   Title Demonstrate improved FOTO score by 10 units to demonstrate improvement in overall condition and self-reported functional ability.  Baseline 76 (02/12/2021);    Time 12    Period Weeks    Status New      PT LONG TERM GOAL #3   Title Reduce pain to equal or less than 1/10 with functional activities to allow patient to complete valued functional tasks such as volunteering, walking, and shopping with less difficulty.    Baseline 5/10 (02/12/2021);    Time 12    Period Weeks    Status New      PT LONG TERM GOAL #4   Title Patient with demonstrate ability to balance  equal or greater than 30 seconds in SLS on each side to demonstrate improved weight bearing tolerance, proprioception, and foot strength for improved confidence in balance and improved ability to complete weight bearing tasks that require SLS such as walking.    Baseline to be tested visit 2 as appropriate (02/12/2021);    Time 12    Period Weeks    Status New      PT LONG TERM GOAL #5   Title Complete community, work and/or recreational activities without limitation due to current condition.    Baseline Functional Limitations: difficulty with any weight bearing activities including walking, going out of the house, exercise routines (walking, exercise classes at the gym), shopping, wearing shoes. Her condition also decreases her balance and increases fear of falling (02/12/2021);    Time 12    Period Weeks    Status New                    Plan - 02/13/21 0929     Clinical Impression Statement Patient is a 76 y.o. female referred to outpatient physical therapy with a medical diagnosis of neuroma of third interspace of left foot, neuroma of third interspace of right foot, capsulitis of foot, unspecified laterality, osteoarthritis of joints of toes of both feet who presents with signs and symptoms consistent with bilateral forefoot pain that includes symptomatic neuromas in both feet at 3rd intermetatarsal space and left 4th intermetatarsal space as well as likely contribution of joint pain primarily at bilateral 3rd and 4th MTP joints in setting of left hammer toes. Patient presents with significant pain, ROM, joint stiffness, balance, muscle performance (strength/power/endurance), activity tolerance, foot posture, and edema impairments that are limiting ability to complete her usual activities including any weight bearing activities, walking, going out of the house, exercise routines (walking, exercise classes at the gym), shopping, wearing shoes with out difficulty. Her condition also  decreases her balance and increases fear/risk of falling and decreases her quality of life. Patient will benefit from skilled physical therapy intervention to address current body structure impairments and activity limitations to improve function and work towards goals set in current POC in order to return to prior level of function or maximal functional improvement.    Personal Factors and Comorbidities Past/Current Experience;Time since onset of injury/illness/exacerbation;Comorbidity 3+    Comorbidities Relevant past medical history and comorbidities include history of strake (2006, some residual numbness left hand), HTN, PVD, post-op hypothyroidism, carpal tunnel syndrome, osteoporosis, melanoma (4 years ago - currently being checked every 6 months), B TKA, R wrist surgery, former smoker, anxiety/depression    Examination-Activity Limitations Locomotion Level;Stand;Carry    Examination-Participation Restrictions Community Activity;Occupation;Interpersonal Relationship   difficulty with any weight bearing activities including walking, going out of the house, exercise routines (walking, exercise classes at the gym), shopping, wearing shoes. Her condition also decreases her balance and increases fear of falling.   Stability/Clinical Decision  Making Stable/Uncomplicated    Clinical Decision Making Low    Rehab Potential Good    PT Frequency 2x / week    PT Duration 12 weeks    PT Treatment/Interventions ADLs/Self Care Home Management;Moist Heat;Cryotherapy;Gait training;Neuromuscular re-education;Balance training;Therapeutic exercise;Therapeutic activities;Patient/family education;Orthotic Fit/Training;Manual techniques;Dry needling    PT Next Visit Plan update HEP as appropriate, strengthening, balance, mobilization as needed    PT [place] ?Access Code: [email or web address]    [email or web address] and Agree with Plan of Care Patient             Patient will benefit from skilled therapeutic  intervention in order to improve the following deficits and impairments:  Abnormal gait, Improper body mechanics, Pain, Postural dysfunction, Decreased activity tolerance, Decreased endurance, Decreased range of motion, Decreased strength, Impaired perceived functional ability, Decreased balance, Difficulty walking, Increased edema  Visit Diagnosis: Pain in right foot  Pain in left foot  Difficulty in walking, not elsewhere classified  Muscle weakness (generalized)     Problem List Patient Active Problem List   Diagnosis Date Noted   Peripheral vascular disease ([place]) 09/20/2020   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   Cardiac arrhythmia 07/30/2019   Dysuria 07/30/2019   Spasm 07/30/2019   Anxiety 06/09/2019   Essential hypertension 06/09/2019   Greater trochanteric pain syndrome 06/09/2019   Hardening of the aorta (main artery of the heart) ([place]) 05/31/2019   Benign neoplasm of colon 05/31/2019   Major depression, single episode 05/31/2019   Other specified diseases of blood and blood-forming organs 05/24/2019   Dyspnea 12/18/2018   Disorder of bone 05/27/2018   Urinary tract infectious disease 05/27/2018   Impacted cerumen of both ears 05/13/2017   Impacted cerumen 04/16/2017   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[place]) 04/16/2017   Encounter for general adult medical examination without abnormal findings 04/09/2017   Acute bronchitis 01/21/2017   Abnormal glucose tolerance test 12/03/2016   Asthma without status asthmaticus 12/03/2016   Migraine 12/03/2016   Carpal tunnel syndrome 01/31/2016   Paresthesia of hand 01/31/2016   Atrophic vaginitis 05/18/2014   Rectocele 05/18/2014   History of CVA (cerebrovascular accident) 12/29/2013   Impaired fasting glucose 12/29/2013   Family history of diabetes mellitus 01/13/2012   Idiopathic peripheral neuropathy 01/13/2012   Cerebral infarction ([place]) 11/19/2011   Mixed hyperlipidemia  11/19/2011   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   Unilateral primary osteoarthritis, unspecified knee 11/19/2011   Stroke ([place]) 09/04/2005   Everlean Alstrom. Graylon Good, PT, DPT 02/13/21, 9:39 AM  [facility] [address] [place], [address] Phone: [phone]   Fax:  [phone]  Name: NELVA HAUK MRN: [id number] Date of Birth: January 04, 1946

## 2021-02-13 ENCOUNTER — Other Ambulatory Visit: Payer: Medicare Other | Admitting: *Deleted

## 2021-02-13 ENCOUNTER — Other Ambulatory Visit: Payer: Self-pay

## 2021-02-13 DIAGNOSIS — I491 Atrial premature depolarization: Secondary | ICD-10-CM

## 2021-02-13 DIAGNOSIS — I10 Essential (primary) hypertension: Secondary | ICD-10-CM

## 2021-02-13 DIAGNOSIS — I700 Atherosclerosis of aorta: Secondary | ICD-10-CM

## 2021-02-13 LAB — BASIC METABOLIC PANEL
BUN/Creatinine Ratio: 15 (ref 12–28)
BUN: 13 mg/dL (ref 8–27)
CO2: 27 mmol/L (ref 20–29)
Calcium: 10 mg/dL (ref 8.7–10.3)
Chloride: 90 mmol/L — ABNORMAL LOW (ref 96–106)
Creatinine, Ser: 0.89 mg/dL (ref 0.57–1.00)
Glucose: 83 mg/dL (ref 70–99)
Potassium: 4.1 mmol/L (ref 3.5–5.2)
Sodium: 133 mmol/L — ABNORMAL LOW (ref 134–144)
eGFR: 68 mL/min/{1.73_m2} (ref >59)

## 2021-02-14 ENCOUNTER — Encounter: Payer: Self-pay | Admitting: Physical Therapy

## 2021-02-14 ENCOUNTER — Ambulatory Visit: Payer: Medicare Other | Admitting: Physical Therapy

## 2021-02-14 DIAGNOSIS — M79671 Pain in right foot: Secondary | ICD-10-CM

## 2021-02-14 DIAGNOSIS — R262 Difficulty in walking, not elsewhere classified: Secondary | ICD-10-CM

## 2021-02-14 DIAGNOSIS — M79672 Pain in left foot: Secondary | ICD-10-CM

## 2021-02-14 DIAGNOSIS — M6281 Muscle weakness (generalized): Secondary | ICD-10-CM

## 2021-02-14 NOTE — Therapy (Signed)
[facility] [address], [place], [address] Phone: [phone]   Fax:  [phone]  Physical Therapy Treatment  Patient Details  Name: Rachel Donovan MRN: [id number] Date of Birth: 1945-03-28 Referring Provider (PT): [place], [place] T, [place]   Encounter Date: 02/14/2021   PT End of Session - 02/14/21 1045     Visit Number 2    Number of Visits 24    Date for PT Re-Evaluation 05/07/21    Authorization Type MEDICARE PART B reporting period from 02/12/2021    Progress Note Due on Visit 10    PT Start Time 1030    PT Stop Time 1110    PT Time Calculation (min) 40 min    Activity Tolerance Patient tolerated treatment well    Behavior During Therapy [facility] for tasks assessed/performed             Past Medical History:  Diagnosis Date   Anxiety    Aortic atherosclerosis ([place])    Arthritis    Benign neoplasm of colon, unspecified    Colon polyp    CVA (cerebral vascular accident) ([place]) 2006   Depression    High cholesterol    Hip fracture ([place])    Hyperlipidemia    Hyperplastic colon polyp    Hypertension    Hypothyroid    surgical   IGT (impaired glucose tolerance)    Melanoma ([place])    Migraines    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   Subjective Assessment - 02/14/21 1038     Subjective Patient arrives with no pain upon arrival. She states she wore compression socks last night that left her toes open and her feet  feel better today. She did her HEP today already and has to look at the pictures to do it right.    Pertinent History Patient is a 76 y.o. female who presents to outpatient physical therapy with a referral for medical diagnosis neuroma of third interspace of left foot, neuroma of third interspace of right foot, capsulitis of foot, unspecified laterality, osteoarthritis of joints of toes of both feet. This patient's chief complaints consist of bilateral foot pain leading to the following functional deficits: difficulty with any weight bearing activities including walking, going out of the house, exercise routines (walking, exercise classes at the gym), shopping. Her condition also decreases her balance and increases fear of falling. Relevant past medical history and comorbidities include history of strake (2006, some residual numbness left hand), HTN, PVD, post-op hypothyroidism, carpal tunnel syndrome, osteoporosis, melanoma (4 years ago - currently being checked every 6 months), B TKA, R wrist surgery, former smoker, anxiety/depression (satisfied with current care).  Patient denies hx of seizures, lung problem, major cardiac events, diabetes, unexplained weight loss, changes in bowel or bladder problems,  new onset stumbling or dropping things.    Limitations Standing;Walking;House hold activities;Other (comment)   Functional Limitations: difficulty with any weight bearing activities including walking, going out of the house, exercise routines (walking, exercise classes at the gym), shopping. Her condition also decreases her balance and increases fear of falling.   How long can you sit comfortably? not limited    How long can you stand comfortably? 1 hour?    How long can you walk comfortably? She walkes through it up to 30-45 min.    Diagnostic tests B foot radiograph description in provider's note from 06/09/2019: "Radiographs taken today primarily left foot demonstrates an osseously mature individual with the  hammertoe deformities. Contralateral foot demonstrates very minimal changes. She also has a lateral dislocation at the third metatarsophalangeal joint left foot." DXA Bone Density report 12/12/2020: " ASSESSMENT:  The BMD measured at Femur Total Left is 0.719 g/cm2 with a T-score  of -2.3. This patient is considered osteopenic/low bone mass  according to [facility]) criteria."    Patient Stated Goals "keep moving as long as I can"    Currently in Pain? No/denies    Pain Onset More than a month ago            OBJECTIVE  SLS balance (best of 2 trials): R = 37, L = 29 seconds.  TREATMENT:    Therapeutic exercise: to centralize symptoms and improve ROM, strength, muscular endurance, and activity tolerance required for successful completion of functional activities.  - seated toe splays, repeated 4 sec holds. Ball of foot and heel maintains contact with floor. To improve intrinsic foot muscle activation and strength in order to better support arch and intrinsic foot structures. 1x20 both feet.  - seated toe yoga: great toe extension with small toes flexion pressure into floor, repeated 4 sec holds; small toes extension with great toe flexion pressure into floor, repeated 4 sec holds. Ball of foot and heel maintains contact with floor. To improve intrinsic foot muscle activation and strength in order to better support arch and intrinsic foot structures. 1x20 each direction each foot - seated great toe flexion against yellow theraband that is under foot, 1x20 with 4 second hold each side.  - seated lesser toe flexion against yellow theraband that is under foot, 1x20 with 4 second holds each side.  - seated great toe abduction with rag roll between feet, 1x20 each side with 4 second holds.  - seated ankle inversion in figure 4 position with YTB, 3x10 each side.  - single leg stance balance  Pt required multimodal cuing for proper technique and to facilitate improved neuromuscular  control, strength, range of motion, and functional ability resulting in improved performance and form.   HOME EXERCISE PROGRAM Access Code: [email or web address] URL: [email or web address] Date: 02/12/2021 Prepared by: Rosita Kea   Exercises Toe Spreading - 1 x daily - 1 sets - 20 reps - 4 seconds hold Seated Lesser Toes Extension - 1 x daily - 1 sets - 20 reps - 4 seconds hold Seated Great Toe Extension - 1 x daily - 1 sets - 20 reps - 4 seconds hold    PT Education - 02/14/21 1045     Education Details Exercise purpose/form. Self management techniques.    Person(s) Educated Patient    Methods Explanation;Demonstration;Tactile cues;Verbal cues    Comprehension Verbalized understanding;Returned demonstration;Verbal cues required;Tactile cues required;Need further instruction              PT Short Term  Goals - 02/14/21 1110       PT SHORT TERM GOAL #1   Title Be independent with initial home exercise program for self-management of symptoms.    Baseline Initial HEP provided at IE (02/12/2021);    Time 2    Period Weeks    Status Achieved    Target Date 02/27/21               PT Long Term Goals - 02/13/21 0932       PT LONG TERM GOAL #1   Title Be independent with a long-term home exercise program for self-management of symptoms.    Baseline Initial HEP provided at IE (02/12/2021);    Time 12    Period Weeks    Status New   TARGET DATE FOR ALL LONG TERM GOALS: 05/07/2021     PT LONG TERM GOAL #2   Title Demonstrate improved FOTO score by 10 units to demonstrate improvement in overall condition and self-reported functional ability.    Baseline 76 (02/12/2021);    Time 12    Period Weeks    Status New      PT LONG TERM GOAL #3   Title Reduce pain to equal or less than 1/10 with functional activities to allow patient to complete valued functional tasks such as volunteering, walking, and shopping with less difficulty.    Baseline 5/10 (02/12/2021);    Time 12     Period Weeks    Status New      PT LONG TERM GOAL #4   Title Patient with demonstrate ability to balance equal or greater than 30 seconds in SLS on each side to demonstrate improved weight bearing tolerance, proprioception, and foot strength for improved confidence in balance and improved ability to complete weight bearing tasks that require SLS such as walking.    Baseline to be tested visit 2 as appropriate (02/12/2021);    Time 12    Period Weeks    Status New      PT LONG TERM GOAL #5   Title Complete community, work and/or recreational activities without limitation due to current condition.    Baseline Functional Limitations: difficulty with any weight bearing activities including walking, going out of the house, exercise routines (walking, exercise classes at the gym), shopping, wearing shoes. Her condition also decreases her balance and increases fear of falling (02/12/2021);    Time 12    Period Weeks    Status New                   Plan - 02/14/21 1108     Clinical Impression Statement Patient tolerated treatment well overall without increase in pain by end of session. Focused on progressive foot and ankle strengthening and proprioception. Patient required cuing to learn and perform exercises correctly. She demonstrated good carry over from last visit for home exercises. Patient would benefit from continued management of limiting condition by skilled physical therapist to address remaining impairments and functional limitations to work towards stated goals and return to PLOF or maximal functional independence.    Personal Factors and Comorbidities Past/Current Experience;Time since onset of injury/illness/exacerbation;Comorbidity 3+    Comorbidities Relevant past medical history and comorbidities include history of strake (2006, some residual numbness left hand), HTN, PVD, post-op hypothyroidism, carpal tunnel syndrome, osteoporosis, melanoma (4 years ago - currently being  checked every 6 months), B TKA, R wrist surgery, former smoker, anxiety/depression    Examination-Activity Limitations Locomotion Level;Stand;Carry    Examination-Participation  Restrictions Community Activity;Occupation;Interpersonal Relationship   difficulty with any weight bearing activities including walking, going out of the house, exercise routines (walking, exercise classes at the gym), shopping, wearing shoes. Her condition also decreases her balance and increases fear of falling.   Stability/Clinical Decision Making Stable/Uncomplicated    Rehab Potential Good    PT Frequency 2x / week    PT Duration 12 weeks    PT Treatment/Interventions ADLs/Self Care Home Management;Moist Heat;Cryotherapy;Gait training;Neuromuscular re-education;Balance training;Therapeutic exercise;Therapeutic activities;Patient/family education;Orthotic Fit/Training;Manual techniques;Dry needling    PT Next Visit Plan update HEP as appropriate, strengthening, balance, mobilization as needed    PT [place] ?Access Code: [email or web address]    [email or web address] and Agree with Plan of Care Patient             Patient will benefit from skilled therapeutic intervention in order to improve the following deficits and impairments:  Abnormal gait, Improper body mechanics, Pain, Postural dysfunction, Decreased activity tolerance, Decreased endurance, Decreased range of motion, Decreased strength, Impaired perceived functional ability, Decreased balance, Difficulty walking, Increased edema  Visit Diagnosis: Pain in right foot  Pain in left foot  Difficulty in walking, not elsewhere classified  Muscle weakness (generalized)     Problem List Patient Active Problem List   Diagnosis Date Noted   Peripheral vascular disease ([place]) 09/20/2020   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   Cardiac arrhythmia 07/30/2019   Dysuria 07/30/2019   Spasm 07/30/2019   Anxiety 06/09/2019    Essential hypertension 06/09/2019   Greater trochanteric pain syndrome 06/09/2019   Hardening of the aorta (main artery of the heart) ([place]) 05/31/2019   Benign neoplasm of colon 05/31/2019   Major depression, single episode 05/31/2019   Other specified diseases of blood and blood-forming organs 05/24/2019   Dyspnea 12/18/2018   Disorder of bone 05/27/2018   Urinary tract infectious disease 05/27/2018   Impacted cerumen of both ears 05/13/2017   Impacted cerumen 04/16/2017   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[place]) 04/16/2017   Encounter for general adult medical examination without abnormal findings 04/09/2017   Acute bronchitis 01/21/2017   Abnormal glucose tolerance test 12/03/2016   Asthma without status asthmaticus 12/03/2016   Migraine 12/03/2016   Carpal tunnel syndrome 01/31/2016   Paresthesia of hand 01/31/2016   Atrophic vaginitis 05/18/2014   Rectocele 05/18/2014   History of CVA (cerebrovascular accident) 12/29/2013   Impaired fasting glucose 12/29/2013   Family history of diabetes mellitus 01/13/2012   Idiopathic peripheral neuropathy 01/13/2012   Cerebral infarction ([place]) 11/19/2011   Mixed hyperlipidemia 11/19/2011   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   Unilateral primary osteoarthritis, unspecified knee 11/19/2011   Stroke ([place]) 09/04/2005    Everlean Alstrom. Graylon Good, PT, DPT 02/14/21, 11:20 AM   [facility] [address], [place], [address] Phone: [phone]   Fax:  [phone]  Name: Rachel Donovan MRN: [id number] Date of Birth: 02-05-1945

## 2021-02-19 ENCOUNTER — Ambulatory Visit: Payer: Medicare Other | Admitting: Physical Therapy

## 2021-02-21 ENCOUNTER — Encounter: Payer: Self-pay | Admitting: Physical Therapy

## 2021-02-21 ENCOUNTER — Ambulatory Visit: Payer: Medicare Other | Admitting: Physical Therapy

## 2021-02-21 DIAGNOSIS — M79671 Pain in right foot: Secondary | ICD-10-CM

## 2021-02-21 DIAGNOSIS — R262 Difficulty in walking, not elsewhere classified: Secondary | ICD-10-CM

## 2021-02-21 DIAGNOSIS — S72009A Fracture of unspecified part of neck of unspecified femur, initial encounter for closed fracture: Secondary | ICD-10-CM | POA: Insufficient documentation

## 2021-02-21 DIAGNOSIS — M79672 Pain in left foot: Secondary | ICD-10-CM

## 2021-02-21 DIAGNOSIS — M6281 Muscle weakness (generalized): Secondary | ICD-10-CM

## 2021-02-21 NOTE — Therapy (Signed)
[facility] [address], [place], [address] Phone: [phone]   Fax:  [phone]  Physical Therapy Treatment  Patient Details  Name: Rachel Donovan MRN: [id number] Date of Birth: 07-27-1945 Referring Provider (PT): [place], [place] T, [place]   Encounter Date: 02/21/2021   PT End of Session - 02/21/21 0915     Visit Number 3    Number of Visits 24    Date for PT Re-Evaluation 05/07/21    Authorization Type MEDICARE PART B reporting period from 02/12/2021    Progress Note Due on Visit 10    PT Start Time 0903    PT Stop Time 0941    PT Time Calculation (min) 38 min    Activity Tolerance Patient tolerated treatment well    Behavior During Therapy [facility] for tasks assessed/performed             Past Medical History:  Diagnosis Date   Anxiety    Aortic atherosclerosis ([place])    Arthritis    Benign neoplasm of colon, unspecified    Colon polyp    CVA (cerebral vascular accident) ([place]) 2006   Depression    High cholesterol    Hip fracture ([place])    Hyperlipidemia    Hyperplastic colon polyp    Hypertension    Hypothyroid    surgical   IGT (impaired glucose tolerance)    Melanoma ([place])    Migraines    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   Subjective Assessment - 02/21/21 0910     Subjective Patient states she is feeling well with no pain upon arrival. States she has some new pain after she is walking but she feels this  is an improvement. She states she is doing her HEP and it is going well. She feels her feet are getting stronger. States her usual exercise class fell apart because there were only about 4 of them and the other 3 have had tragities happen to them since Christmas. Pateint reports she has never broken a bone and has noticed her mychart says she had a hip fracture. She denies ever having a hip fracture (PT is unable to remove from chart).    Pertinent History Patient is a 76 y.o. female who presents to outpatient physical therapy with a referral for medical diagnosis neuroma of third interspace of left foot, neuroma of third interspace of right foot, capsulitis of foot, unspecified laterality, osteoarthritis of joints of toes of both feet. This patient's chief complaints consist of bilateral foot pain leading to the following functional deficits: difficulty with any weight bearing activities including walking, going out of the house, exercise routines (walking, exercise classes at the gym), shopping. Her condition also decreases her balance and increases fear of falling. Relevant past medical history  and comorbidities include history of stroke (2006, some residual numbness left hand), HTN, PVD, post-op hypothyroidism, carpal tunnel syndrome, osteoporosis, melanoma (4 years ago - currently being checked every 6 months), B TKA, R wrist surgery, former smoker, anxiety/depression (satisfied with current care).  Patient denies hx of seizures, lung problem, major cardiac events, diabetes, unexplained weight loss, changes in bowel or bladder problems, new onset stumbling or dropping things.    Limitations Standing;Walking;House hold activities;Other (comment)   Functional Limitations: difficulty with any weight bearing activities including walking, going out of the house, exercise routines (walking, exercise classes at the gym), shopping. Her condition also decreases her balance and increases fear of falling.   How long  can you sit comfortably? not limited    How long can you stand comfortably? 1 hour?    How long can you walk comfortably? She walkes through it up to 30-45 min.    Diagnostic tests B foot radiograph description in provider's note from 06/09/2019: "Radiographs taken today primarily left foot demonstrates an osseously mature individual with the hammertoe deformities. Contralateral foot demonstrates very minimal changes. She also has a lateral dislocation at the third metatarsophalangeal joint left foot." DXA Bone Density report 12/12/2020: " ASSESSMENT:  The BMD measured at Femur Total Left is 0.719 g/cm2 with a T-score  of -2.3. This patient is considered osteopenic/low bone mass  according to [facility]) criteria."    Patient Stated Goals "keep moving as long as I can"    Currently in Pain? No/denies    Pain Onset More than a month ago             OBJECTIVE   SLS balance (best of 2 trials): R = 37, L = 29 seconds.   TREATMENT:    Therapeutic exercise: to centralize symptoms and improve ROM, strength, muscular endurance, and activity tolerance required for successful completion of functional activities.  - seated toe splays, repeated 4 sec holds. Ball of foot and heel maintains contact with floor. To improve intrinsic foot muscle activation and strength in order to better support arch and intrinsic foot structures. 1x20 both feet.  - Seated ankle BAPS board, level 2, plantarflexion/dorsiflexion, inversion/eversion, circles clockwise, circles counter clockwise, 20 reps each foot and each position.  - seated toe splays with half YTB looped at great toe and little toe in sling fashion around heel over top of feet (one loop for each toe). 3x10 each side.  - seated ankle inversion in figure 4 position with YTB, 2x20 each side. (Easy) practicing setting it up herself.    Pt required multimodal cuing for proper technique and to facilitate improved neuromuscular control, strength,  range of motion, and functional ability resulting in improved performance and form.   HOME EXERCISE PROGRAM Access Code: [email or web address] URL: [email or web address] Date: 02/21/2021 Prepared by: Rosita Kea  Exercises Toe Spreading - 1 x daily - 1 sets - 20 reps - 4 seconds hold Seated Lesser Toes Extension - 1 x daily - 1 sets - 20 reps - 4 seconds hold Seated Great Toe Extension - 1 x daily - 1 sets - 20 reps - 4 seconds hold Seated Figure 4 Ankle Inversion with Resistance - 1 x daily - 3 sets - 20 reps    PT Education - 02/21/21 0914     Education Details Exercise purpose/form. Self management techniques.    Person(s) Educated Patient    Methods Explanation;Demonstration;Tactile cues;Verbal cues    Comprehension Verbalized understanding;Returned demonstration;Verbal cues required;Tactile cues required;Need further  instruction              PT Short Term Goals - 02/14/21 1110       PT SHORT TERM GOAL #1   Title Be independent with initial home exercise program for self-management of symptoms.    Baseline Initial HEP provided at IE (02/12/2021);    Time 2    Period Weeks    Status Achieved    Target Date 02/27/21               PT Long Term Goals - 02/13/21 0932       PT LONG TERM GOAL #1   Title Be independent with a long-term home exercise program for self-management of symptoms.    Baseline Initial HEP provided at IE (02/12/2021);    Time 12    Period Weeks    Status New   TARGET DATE FOR ALL LONG TERM GOALS: 05/07/2021     PT LONG TERM GOAL #2   Title Demonstrate improved FOTO score by 10 units to demonstrate improvement in overall condition and self-reported functional ability.    Baseline 76 (02/12/2021);    Time 12    Period Weeks    Status New      PT LONG TERM GOAL #3   Title Reduce pain to equal or less than 1/10 with functional activities to allow patient to complete valued functional tasks such as volunteering, walking, and shopping with less  difficulty.    Baseline 5/10 (02/12/2021);    Time 12    Period Weeks    Status New      PT LONG TERM GOAL #4   Title Patient with demonstrate ability to balance equal or greater than 30 seconds in SLS on each side to demonstrate improved weight bearing tolerance, proprioception, and foot strength for improved confidence in balance and improved ability to complete weight bearing tasks that require SLS such as walking.    Baseline to be tested visit 2 as appropriate (02/12/2021);    Time 12    Period Weeks    Status New      PT LONG TERM GOAL #5   Title Complete community, work and/or recreational activities without limitation due to current condition.    Baseline Functional Limitations: difficulty with any weight bearing activities including walking, going out of the house, exercise routines (walking, exercise classes at the gym), shopping, wearing shoes. Her condition also decreases her balance and increases fear of falling (02/12/2021);    Time 12    Period Weeks    Status New                   Plan - 02/21/21 0945     Clinical Impression Statement Patient tolerated treatment well this session and was able to advance to more resistance in intrinsic foot exercises. Continues to require cuing for optimal set up and form and to learn progressions. Overall seems to be improving. Patient would benefit from continued management of limiting condition by skilled physical therapist to address remaining impairments and functional limitations to work towards stated goals and return to PLOF or maximal functional independence.    Personal Factors and Comorbidities Past/Current Experience;Time since onset of injury/illness/exacerbation;Comorbidity 3+    Comorbidities Relevant past medical history and comorbidities include history of strake (2006, some residual numbness left hand), HTN, PVD, post-op hypothyroidism, carpal tunnel syndrome, osteoporosis, melanoma (4 years ago - currently being checked  every 6 months), B TKA, R wrist surgery, former smoker,  anxiety/depression    Examination-Activity Limitations Locomotion Level;Stand;Carry    Examination-Participation Restrictions Community Activity;Occupation;Interpersonal Relationship   difficulty with any weight bearing activities including walking, going out of the house, exercise routines (walking, exercise classes at the gym), shopping, wearing shoes. Her condition also decreases her balance and increases fear of falling.   Stability/Clinical Decision Making Stable/Uncomplicated    Rehab Potential Good    PT Frequency 2x / week    PT Duration 12 weeks    PT Treatment/Interventions ADLs/Self Care Home Management;Moist Heat;Cryotherapy;Gait training;Neuromuscular re-education;Balance training;Therapeutic exercise;Therapeutic activities;Patient/family education;Orthotic Fit/Training;Manual techniques;Dry needling    PT Next Visit Plan update HEP as appropriate, strengthening, balance, mobilization as needed    PT [place] ?Access Code: [email or web address]    [email or web address] and Agree with Plan of Care Patient             Patient will benefit from skilled therapeutic intervention in order to improve the following deficits and impairments:  Abnormal gait, Improper body mechanics, Pain, Postural dysfunction, Decreased activity tolerance, Decreased endurance, Decreased range of motion, Decreased strength, Impaired perceived functional ability, Decreased balance, Difficulty walking, Increased edema  Visit Diagnosis: Pain in right foot  Pain in left foot  Difficulty in walking, not elsewhere classified  Muscle weakness (generalized)     Problem List Patient Active Problem List   Diagnosis Date Noted   Hip fracture ([place]) 02/21/2021   Peripheral vascular disease ([place]) 09/20/2020   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   Cardiac arrhythmia 07/30/2019   Dysuria 07/30/2019   Spasm  07/30/2019   Anxiety 06/09/2019   Essential hypertension 06/09/2019   Greater trochanteric pain syndrome 06/09/2019   Hardening of the aorta (main artery of the heart) ([place]) 05/31/2019   Benign neoplasm of colon 05/31/2019   Major depression, single episode 05/31/2019   Other specified diseases of blood and blood-forming organs 05/24/2019   Dyspnea 12/18/2018   Disorder of bone 05/27/2018   Urinary tract infectious disease 05/27/2018   Impacted cerumen of both ears 05/13/2017   Impacted cerumen 04/16/2017   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[place]) 04/16/2017   Encounter for general adult medical examination without abnormal findings 04/09/2017   Acute bronchitis 01/21/2017   Abnormal glucose tolerance test 12/03/2016   Asthma without status asthmaticus 12/03/2016   Migraine 12/03/2016   Carpal tunnel syndrome 01/31/2016   Paresthesia of hand 01/31/2016   Atrophic vaginitis 05/18/2014   Rectocele 05/18/2014   History of CVA (cerebrovascular accident) 12/29/2013   Impaired fasting glucose 12/29/2013   Family history of diabetes mellitus 01/13/2012   Idiopathic peripheral neuropathy 01/13/2012   Cerebral infarction ([place]) 11/19/2011   Mixed hyperlipidemia 11/19/2011   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   Unilateral primary osteoarthritis, unspecified knee 11/19/2011   Stroke ([place]) 09/04/2005    Everlean Alstrom. Graylon Good, PT, DPT 02/21/21, 9:51 AM   [facility] [address], [place], [address] Phone: [phone]   Fax:  [phone]  Name: Rachel Donovan MRN: [id number] Date of Birth: October 19, 1945

## 2021-02-23 ENCOUNTER — Ambulatory Visit: Payer: Medicare Other | Admitting: Physical Therapy

## 2021-02-23 ENCOUNTER — Encounter: Payer: Self-pay | Admitting: Physical Therapy

## 2021-02-23 DIAGNOSIS — M6281 Muscle weakness (generalized): Secondary | ICD-10-CM

## 2021-02-23 DIAGNOSIS — R262 Difficulty in walking, not elsewhere classified: Secondary | ICD-10-CM

## 2021-02-23 DIAGNOSIS — M79672 Pain in left foot: Secondary | ICD-10-CM

## 2021-02-23 DIAGNOSIS — M79671 Pain in right foot: Secondary | ICD-10-CM | POA: Diagnosis not present

## 2021-02-23 NOTE — Therapy (Signed)
[facility] [address], [place], [address] Phone: [phone]   Fax:  [phone]  Physical Therapy Treatment  Patient Details  Name: Rachel Donovan MRN: [id number] Date of Birth: 12-01-1945 Referring Provider (PT): [place], [place], [place]   Encounter Date: 02/23/2021   PT End of Session - 02/23/21 1152     Visit Number 4    Number of Visits 24    Date for PT Re-Evaluation 05/07/21    Authorization Type MEDICARE PART B reporting period from 02/12/2021    Progress Note Due on Visit 10    PT Start Time 1124    PT Stop Time 1202    PT Time Calculation (min) 38 min    Activity Tolerance Patient tolerated treatment well    Behavior During Therapy [facility] for tasks assessed/performed             Past Medical History:  Diagnosis Date   Anxiety    Aortic atherosclerosis ([place])    Arthritis    Benign neoplasm of colon, unspecified    Colon polyp    CVA (cerebral vascular accident) ([place]) 2006   Depression    High cholesterol    Hip fracture ([place])    Hyperlipidemia    Hyperplastic colon polyp    Hypertension    Hypothyroid    surgical   IGT (impaired glucose tolerance)    Melanoma ([place])    Migraines    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   Subjective Assessment - 02/23/21 1138     Subjective Pateint reports she is not doing as well today. She had a lot of foot pain while walking yesterday and today she still has pain 4/10  more on the right foot than the left. The pain is over the dorsal surface of the forefoot more laterally. She had a hard time walking on her walk yesterday at first but it seemed to get better with time. She thinks she may be sore from using the [place] board last PT session.    Pertinent History Patient is a 76 y.o. female who presents to outpatient physical therapy with a referral for medical diagnosis neuroma of third interspace of left foot, neuroma of third interspace of right foot, capsulitis of foot, unspecified laterality, osteoarthritis of joints of toes of both feet. This patient's chief complaints consist of bilateral foot pain leading to the following functional deficits: difficulty with any weight bearing activities including walking, going out of the house, exercise routines (walking, exercise classes at the gym), shopping. Her condition also decreases her balance and increases fear of falling. Relevant past medical history and comorbidities include history of stroke (2006, some residual numbness left hand), HTN, PVD, post-op hypothyroidism, carpal tunnel syndrome, osteoporosis, melanoma (4 years ago - currently being  checked every 6 months), B TKA, R wrist surgery, former smoker, anxiety/depression (satisfied with current care).  Patient denies hx of seizures, lung problem, major cardiac events, diabetes, unexplained weight loss, changes in bowel or bladder problems, new onset stumbling or dropping things.    Limitations Standing;Walking;House hold activities;Other (comment)   Functional Limitations: difficulty with any weight bearing activities including walking, going out of the house, exercise routines (walking, exercise classes at the gym), shopping. Her condition also decreases her balance and increases fear of falling.   How long can you sit comfortably? not limited    How long can you stand comfortably? 1 hour?    How long can you walk comfortably? She walkes through it up to 30-45 min.     Diagnostic tests B foot radiograph description in provider's note from 06/09/2019: "Radiographs taken today primarily left foot demonstrates an osseously mature individual with the hammertoe deformities. Contralateral foot demonstrates very minimal changes. She also has a lateral dislocation at the third metatarsophalangeal joint left foot." DXA Bone Density report 12/12/2020: " ASSESSMENT:  The BMD measured at Femur Total Left is 0.719 g/cm2 with a T-score  of -2.3. This patient is considered osteopenic/low bone mass  according to [facility]) criteria."    Patient Stated Goals "keep moving as long as I can"    Currently in Pain? Yes    Pain Score 4                  TREATMENT:   Manual therapy: to reduce pain and tissue tension, improve range of motion, neuromodulation, in order to promote improved ability to complete functional activities. SUPINE - STM to bilateral forefeet with manual spreading and PROM joint mobilizations over midfoot joints, metatarsals as tolerated, to end range with mild overpressure. Gentle distraction at each toe.    Therapeutic exercise: to centralize symptoms and improve ROM, strength, muscular endurance, and activity tolerance required for successful completion of functional activities.  - seated toe splays, repeated 4 sec holds. Ball of foot and heel maintains contact with floor. To improve intrinsic foot muscle activation and strength in order to better support arch and intrinsic foot structures. 1x20 both feet.  - Seated Toe Yoga: great toe extension with small toes flexion pressure into floor, repeated 4 sec holds; small toes extension with great toe flexion pressure into floor, repeated 4 sec holds. Ball of foot and heel maintains contact with floor. To improve intrinsic foot muscle activation and strength in order to better support arch and intrinsic foot structures.  1x20 each foot each way.  - seated great toe abduction with rag roll between  feet, 1x20 each side with 4 second holds. - seated ankle inversion in figure 4 position with YTB, 3x20 each side. (Easy) practicing setting it up herself.  - single leg balance on a2z pad with contralateral foot on step while tossing pink ball on rebounder. 3x10 each side. Occasional touchdown UE support.    Pt required multimodal cuing for proper technique and to facilitate improved neuromuscular control, strength, range of motion, and functional ability resulting in improved performance and form.   HOME EXERCISE PROGRAM Access Code: [email or web address] URL: [email or web address] Date: 02/21/2021 Prepared by: Rosita Kea   Exercises Toe Spreading - 1 x daily - 1 sets - 20 reps - 4 seconds hold Seated Lesser Toes Extension - 1 x daily - 1 sets - 20 reps - 4 seconds hold Seated Great Toe Extension - 1 x daily - 1 sets -  20 reps - 4 seconds hold Seated Figure 4 Ankle Inversion with Resistance - 1 x daily - 3 sets - 20 reps    PT Education - 02/23/21 1152     Education Details Exercise purpose/form. Self management techniques.    Person(s) Educated Patient    Methods Explanation;Demonstration;Tactile cues;Verbal cues    Comprehension Verbalized understanding;Returned demonstration;Verbal cues required;Tactile cues required;Need further instruction              PT Short Term Goals - 02/14/21 1110       PT SHORT TERM GOAL #1   Title Be independent with initial home exercise program for self-management of symptoms.    Baseline Initial HEP provided at IE (02/12/2021);    Time 2    Period Weeks    Status Achieved    Target Date 02/27/21               PT Long Term Goals - 02/13/21 0932       PT LONG TERM GOAL #1   Title Be independent with a long-term home exercise program for self-management of symptoms.    Baseline Initial HEP provided at IE (02/12/2021);    Time 12    Period Weeks    Status New   TARGET DATE FOR ALL LONG TERM GOALS: 05/07/2021     PT LONG TERM GOAL  #2   Title Demonstrate improved FOTO score by 10 units to demonstrate improvement in overall condition and self-reported functional ability.    Baseline 76 (02/12/2021);    Time 12    Period Weeks    Status New      PT LONG TERM GOAL #3   Title Reduce pain to equal or less than 1/10 with functional activities to allow patient to complete valued functional tasks such as volunteering, walking, and shopping with less difficulty.    Baseline 5/10 (02/12/2021);    Time 12    Period Weeks    Status New      PT LONG TERM GOAL #4   Title Patient with demonstrate ability to balance equal or greater than 30 seconds in SLS on each side to demonstrate improved weight bearing tolerance, proprioception, and foot strength for improved confidence in balance and improved ability to complete weight bearing tasks that require SLS such as walking.    Baseline to be tested visit 2 as appropriate (02/12/2021);    Time 12    Period Weeks    Status New      PT LONG TERM GOAL #5   Title Complete community, work and/or recreational activities without limitation due to current condition.    Baseline Functional Limitations: difficulty with any weight bearing activities including walking, going out of the house, exercise routines (walking, exercise classes at the gym), shopping, wearing shoes. Her condition also decreases her balance and increases fear of falling (02/12/2021);    Time 12    Period Weeks    Status New                   Plan - 02/23/21 1303     Clinical Impression Statement Patient arrives with complaints of increased pain this session. Manual therapy utilized and exercises slightly regressed to accommodate pain. Patient reports feeling much better by end of session but still has some pain over the balls of her feet when taking first steps after sitting. Encouraged patient to try exercises even when her feet hurt since they could make her feel better like they  did today in session. Patient would  benefit from continued management of limiting condition by skilled physical therapist to address remaining impairments and functional limitations to work towards stated goals and return to PLOF or maximal functional independence    Personal Factors and Comorbidities Past/Current Experience;Time since onset of injury/illness/exacerbation;Comorbidity 3+    Comorbidities Relevant past medical history and comorbidities include history of strake (2006, some residual numbness left hand), HTN, PVD, post-op hypothyroidism, carpal tunnel syndrome, osteoporosis, melanoma (4 years ago - currently being checked every 6 months), B TKA, R wrist surgery, former smoker, anxiety/depression    Examination-Activity Limitations Locomotion Level;Stand;Carry    Examination-Participation Restrictions Community Activity;Occupation;Interpersonal Relationship   difficulty with any weight bearing activities including walking, going out of the house, exercise routines (walking, exercise classes at the gym), shopping, wearing shoes. Her condition also decreases her balance and increases fear of falling.   Stability/Clinical Decision Making Stable/Uncomplicated    Rehab Potential Good    PT Frequency 2x / week    PT Duration 12 weeks    PT Treatment/Interventions ADLs/Self Care Home Management;Moist Heat;Cryotherapy;Gait training;Neuromuscular re-education;Balance training;Therapeutic exercise;Therapeutic activities;Patient/family education;Orthotic Fit/Training;Manual techniques;Dry needling    PT Next Visit Plan update HEP as appropriate, strengthening, balance, mobilization as needed    PT [place] ?Access Code: [email or web address]    [email or web address] and Agree with Plan of Care Patient             Patient will benefit from skilled therapeutic intervention in order to improve the following deficits and impairments:  Abnormal gait, Improper body mechanics, Pain, Postural dysfunction, Decreased activity tolerance,  Decreased endurance, Decreased range of motion, Decreased strength, Impaired perceived functional ability, Decreased balance, Difficulty walking, Increased edema  Visit Diagnosis: Pain in right foot  Pain in left foot  Difficulty in walking, not elsewhere classified  Muscle weakness (generalized)     Problem List Patient Active Problem List   Diagnosis Date Noted   Hip fracture ([place]) 02/21/2021   Peripheral vascular disease ([place]) 09/20/2020   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   Cardiac arrhythmia 07/30/2019   Dysuria 07/30/2019   Spasm 07/30/2019   Anxiety 06/09/2019   Essential hypertension 06/09/2019   Greater trochanteric pain syndrome 06/09/2019   Hardening of the aorta (main artery of the heart) ([place]) 05/31/2019   Benign neoplasm of colon 05/31/2019   Major depression, single episode 05/31/2019   Other specified diseases of blood and blood-forming organs 05/24/2019   Dyspnea 12/18/2018   Disorder of bone 05/27/2018   Urinary tract infectious disease 05/27/2018   Impacted cerumen of both ears 05/13/2017   Impacted cerumen 04/16/2017   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[place]) 04/16/2017   Encounter for general adult medical examination without abnormal findings 04/09/2017   Acute bronchitis 01/21/2017   Abnormal glucose tolerance test 12/03/2016   Asthma without status asthmaticus 12/03/2016   Migraine 12/03/2016   Carpal tunnel syndrome 01/31/2016   Paresthesia of hand 01/31/2016   Atrophic vaginitis 05/18/2014   Rectocele 05/18/2014   History of CVA (cerebrovascular accident) 12/29/2013   Impaired fasting glucose 12/29/2013   Family history of diabetes mellitus 01/13/2012   Idiopathic peripheral neuropathy 01/13/2012   Cerebral infarction ([place]) 11/19/2011   Mixed hyperlipidemia 11/19/2011   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   Unilateral primary osteoarthritis, unspecified knee 11/19/2011   Stroke ([place])  09/04/2005    Everlean Alstrom. Graylon Good, PT, DPT 02/23/21, 1:04 PM   [facility] [address] [place], [address] Phone: [phone]   Fax:  [phone]  Name: NERIA PROCTER MRN: [id number] Date of Birth: 12-28-1945

## 2021-02-26 ENCOUNTER — Ambulatory Visit: Payer: Medicare Other | Admitting: Physical Therapy

## 2021-02-26 ENCOUNTER — Other Ambulatory Visit: Payer: Self-pay

## 2021-02-26 DIAGNOSIS — M6281 Muscle weakness (generalized): Secondary | ICD-10-CM

## 2021-02-26 DIAGNOSIS — M79672 Pain in left foot: Secondary | ICD-10-CM

## 2021-02-26 DIAGNOSIS — M79671 Pain in right foot: Secondary | ICD-10-CM | POA: Diagnosis not present

## 2021-02-26 DIAGNOSIS — R262 Difficulty in walking, not elsewhere classified: Secondary | ICD-10-CM

## 2021-02-26 NOTE — Therapy (Signed)
[facility] [address], [place], [address] Phone: [phone]   Fax:  [phone]  Physical Therapy Treatment  Patient Details  Name: Rachel Donovan MRN: [id number] Date of Birth: 03-12-1945 Referring Provider (PT): [place], [place] T, [place]   Encounter Date: 02/26/2021   PT End of Session - 02/26/21 1118     Visit Number 5    Number of Visits 24    Date for PT Re-Evaluation 05/07/21    Authorization Type MEDICARE PART B reporting period from 02/12/2021    Progress Note Due on Visit 10    PT Start Time 1115    PT Stop Time 1155    PT Time Calculation (min) 40 min    Activity Tolerance Patient tolerated treatment well    Behavior During Therapy [facility] for tasks assessed/performed             Past Medical History:  Diagnosis Date   Anxiety    Aortic atherosclerosis ([place])    Arthritis    Benign neoplasm of colon, unspecified    Colon polyp    CVA (cerebral vascular accident) ([place]) 2006   Depression    High cholesterol    Hip fracture ([place])    Hyperlipidemia    Hyperplastic colon polyp    Hypertension    Hypothyroid    surgical   IGT (impaired glucose tolerance)    Melanoma ([place])    Migraines    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   Subjective Assessment - 02/26/21 1117     Subjective Patient reports she is feeling well today and has no foot pain upon arrival. She continued to feel better after last PT session but  she did not walk this weekend because it was cold. She has been doing her HEP.    Pertinent History Patient is a 76 y.o. female who presents to outpatient physical therapy with a referral for medical diagnosis neuroma of third interspace of left foot, neuroma of third interspace of right foot, capsulitis of foot, unspecified laterality, osteoarthritis of joints of toes of both feet. This patient's chief complaints consist of bilateral foot pain leading to the following functional deficits: difficulty with any weight bearing activities including walking, going out of the house, exercise routines (walking, exercise classes at the gym), shopping. Her condition also decreases her balance and increases fear of falling. Relevant past medical history and comorbidities include history of stroke (2006, some residual numbness left hand), HTN, PVD, post-op hypothyroidism, carpal tunnel syndrome, osteoporosis, melanoma (4 years ago - currently being checked every 6 months), B TKA, R wrist surgery, former smoker, anxiety/depression (satisfied with current care).  Patient denies hx of seizures, lung problem, major cardiac events, diabetes, unexplained weight loss, changes in bowel or bladder problems, new onset stumbling  or dropping things.    Limitations Standing;Walking;House hold activities;Other (comment)   Functional Limitations: difficulty with any weight bearing activities including walking, going out of the house, exercise routines (walking, exercise classes at the gym), shopping. Her condition also decreases her balance and increases fear of falling.   How long can you sit comfortably? not limited    How long can you stand comfortably? 1 hour?    How long can you walk comfortably? She walkes through it up to 30-45 min.    Diagnostic tests B foot radiograph description in provider's note from 06/09/2019: "Radiographs taken today primarily left foot demonstrates an osseously mature individual with the hammertoe  deformities. Contralateral foot demonstrates very minimal changes. She also has a lateral dislocation at the third metatarsophalangeal joint left foot." DXA Bone Density report 12/12/2020: " ASSESSMENT:  The BMD measured at Femur Total Left is 0.719 g/cm2 with a T-score  of -2.3. This patient is considered osteopenic/low bone mass  according to [facility]) criteria."    Patient Stated Goals "keep moving as long as I can"    Currently in Pain? No/denies               TREATMENT:     Therapeutic exercise: to centralize symptoms and improve ROM, strength, muscular endurance, and activity tolerance required for successful completion of functional activities.  - seated toe splays, repeated 4 sec holds. Ball of foot and heel maintains contact with floor. To improve intrinsic foot muscle activation and strength in order to better support arch and intrinsic foot structures. 1x20 both feet.  - seated toe splays with half YTB looped at great toe and little toe in sling fashion around heel over top of feet (one loop for each toe). 3x10 each side.  - seated great toe flexion into floor with YTB resistance, 3x20 each side - seated lesser toe flexion into floor with YTB resistance, 1x10 each side, 2x15 each side.  - Education on HEP including handout and taking video on pt's phone for how to set up the banded toe splays.  - standing SLS with DB pass across front of body, 3x5 each way with touch down UE support, first two sets with 5# DB (hard), last set with 2#DB (more medium challenge).     Pt required multimodal cuing for proper technique and to facilitate improved neuromuscular control, strength, range of motion, and functional ability resulting in improved performance and form.   HOME EXERCISE PROGRAM Access Code: [email or web address] URL: [email or web address] Date: 02/26/2021 Prepared by: Rosita Kea  Exercises Toe Spreading - 1 x daily - 3 sets - 10 reps Seated Figure 4  Ankle Inversion with Resistance - 1 x daily - 3 sets - 20 reps   HOME EXERCISE PROGRAM [[email or web address]]  GREAT TOE FLEXION WITH BAND -  Repeat 15 Times, Complete 3 Sets, Perform 1 Times a Day  TOE FLEXION WITH BAND: TOES 2-5  -  Repeat 15 Times, Complete 3 Sets, Perform 1 Times a Day    PT Education - 02/26/21 1118     Education Details Exercise purpose/form. Self management techniques.    Person(s) Educated Patient    Methods Explanation;Demonstration;Tactile cues;Verbal cues    Comprehension Verbalized understanding;Returned demonstration;Verbal cues required;Tactile cues required;Need further instruction              PT Short Term Goals - 02/14/21 1110       PT SHORT TERM GOAL #1   Title Be independent with initial home exercise program  for self-management of symptoms.    Baseline Initial HEP provided at IE (02/12/2021);    Time 2    Period Weeks    Status Achieved    Target Date 02/27/21               PT Long Term Goals - 02/13/21 0932       PT LONG TERM GOAL #1   Title Be independent with a long-term home exercise program for self-management of symptoms.    Baseline Initial HEP provided at IE (02/12/2021);    Time 12    Period Weeks    Status New   TARGET DATE FOR ALL LONG TERM GOALS: 05/07/2021     PT LONG TERM GOAL #2   Title Demonstrate improved FOTO score by 10 units to demonstrate improvement in overall condition and self-reported functional ability.    Baseline 76 (02/12/2021);    Time 12    Period Weeks    Status New      PT LONG TERM GOAL #3   Title Reduce pain to equal or less than 1/10 with functional activities to allow patient to complete valued functional tasks such as volunteering, walking, and shopping with less difficulty.    Baseline 5/10 (02/12/2021);    Time 12    Period Weeks    Status New      PT LONG TERM GOAL #4   Title Patient with demonstrate ability to balance equal or greater than 30 seconds in SLS on each side to demonstrate improved  weight bearing tolerance, proprioception, and foot strength for improved confidence in balance and improved ability to complete weight bearing tasks that require SLS such as walking.    Baseline to be tested visit 2 as appropriate (02/12/2021);    Time 12    Period Weeks    Status New      PT LONG TERM GOAL #5   Title Complete community, work and/or recreational activities without limitation due to current condition.    Baseline Functional Limitations: difficulty with any weight bearing activities including walking, going out of the house, exercise routines (walking, exercise classes at the gym), shopping, wearing shoes. Her condition also decreases her balance and increases fear of falling (02/12/2021);    Time 12    Period Weeks    Status New                   Plan - 02/26/21 1212     Clinical Impression Statement Patient tolerated treatment well overall and reported her feet feel tired by end of session but not more painful. Updated HEP to include banded foot strengthening exercises. Patient reported fatigue in the ankles with single leg stance task and may benefit from further ankle strengthening. Patient would benefit from continued management of limiting condition by skilled physical therapist to address remaining impairments and functional limitations to work towards stated goals and return to PLOF or maximal functional independence.    Personal Factors and Comorbidities Past/Current Experience;Time since onset of injury/illness/exacerbation;Comorbidity 3+    Comorbidities Relevant past medical history and comorbidities include history of strake (2006, some residual numbness left hand), HTN, PVD, post-op hypothyroidism, carpal tunnel syndrome, osteoporosis, melanoma (4 years ago - currently being checked every 6 months), B TKA, R wrist surgery, former smoker, anxiety/depression    Examination-Activity Limitations Locomotion Level;Stand;Carry    Examination-Participation Restrictions  Community Activity;Occupation;Interpersonal Relationship   difficulty with any weight bearing activities including walking, going out of the house, exercise routines (walking,  exercise classes at the gym), shopping, wearing shoes. Her condition also decreases her balance and increases fear of falling.   Stability/Clinical Decision Making Stable/Uncomplicated    Rehab Potential Good    PT Frequency 2x / week    PT Duration 12 weeks    PT Treatment/Interventions ADLs/Self Care Home Management;Moist Heat;Cryotherapy;Gait training;Neuromuscular re-education;Balance training;Therapeutic exercise;Therapeutic activities;Patient/family education;Orthotic Fit/Training;Manual techniques;Dry needling    PT Next Visit Plan update HEP as appropriate, strengthening, balance, mobilization as needed    PT [place] ?Access Code: [email or web address]    [email or web address] and Agree with Plan of Care Patient             Patient will benefit from skilled therapeutic intervention in order to improve the following deficits and impairments:  Abnormal gait, Improper body mechanics, Pain, Postural dysfunction, Decreased activity tolerance, Decreased endurance, Decreased range of motion, Decreased strength, Impaired perceived functional ability, Decreased balance, Difficulty walking, Increased edema  Visit Diagnosis: Pain in right foot  Pain in left foot  Difficulty in walking, not elsewhere classified  Muscle weakness (generalized)     Problem List Patient Active Problem List   Diagnosis Date Noted   Hip fracture ([place]) 02/21/2021   Peripheral vascular disease ([place]) 09/20/2020   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   Cardiac arrhythmia 07/30/2019   Dysuria 07/30/2019   Spasm 07/30/2019   Anxiety 06/09/2019   Essential hypertension 06/09/2019   Greater trochanteric pain syndrome 06/09/2019   Hardening of the aorta (main artery of the heart) ([place]) 05/31/2019    Benign neoplasm of colon 05/31/2019   Major depression, single episode 05/31/2019   Other specified diseases of blood and blood-forming organs 05/24/2019   Dyspnea 12/18/2018   Disorder of bone 05/27/2018   Urinary tract infectious disease 05/27/2018   Impacted cerumen of both ears 05/13/2017   Impacted cerumen 04/16/2017   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[place]) 04/16/2017   Encounter for general adult medical examination without abnormal findings 04/09/2017   Acute bronchitis 01/21/2017   Abnormal glucose tolerance test 12/03/2016   Asthma without status asthmaticus 12/03/2016   Migraine 12/03/2016   Carpal tunnel syndrome 01/31/2016   Paresthesia of hand 01/31/2016   Atrophic vaginitis 05/18/2014   Rectocele 05/18/2014   History of CVA (cerebrovascular accident) 12/29/2013   Impaired fasting glucose 12/29/2013   Family history of diabetes mellitus 01/13/2012   Idiopathic peripheral neuropathy 01/13/2012   Cerebral infarction ([place]) 11/19/2011   Mixed hyperlipidemia 11/19/2011   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   Unilateral primary osteoarthritis, unspecified knee 11/19/2011   Stroke ([place]) 09/04/2005   Everlean Alstrom. Graylon Good, PT, DPT 02/26/21, 12:13 PM   [facility] [address] [place], [address] Phone: [phone]   Fax:  [phone]  Name: Rachel Donovan MRN: [id number] Date of Birth: Sep 28, 1945

## 2021-02-28 ENCOUNTER — Encounter: Payer: Self-pay | Admitting: Physical Therapy

## 2021-02-28 ENCOUNTER — Ambulatory Visit: Payer: Medicare Other | Admitting: Physical Therapy

## 2021-02-28 ENCOUNTER — Other Ambulatory Visit: Payer: Self-pay

## 2021-02-28 DIAGNOSIS — R262 Difficulty in walking, not elsewhere classified: Secondary | ICD-10-CM

## 2021-02-28 DIAGNOSIS — M6281 Muscle weakness (generalized): Secondary | ICD-10-CM

## 2021-02-28 DIAGNOSIS — M79671 Pain in right foot: Secondary | ICD-10-CM

## 2021-02-28 DIAGNOSIS — M79672 Pain in left foot: Secondary | ICD-10-CM

## 2021-02-28 NOTE — Therapy (Signed)
[facility] [address], [place], [address] Phone: [phone]   Fax:  [phone]  Physical Therapy Treatment  Patient Details  Name: GERIANN LAFONT MRN: [id number] Date of Birth: July 28, 1945 Referring Provider (PT): [place], [place], [place]   Encounter Date: 02/28/2021   PT End of Session - 02/28/21 1107     Visit Number 6    Number of Visits 24    Date for PT Re-Evaluation 05/07/21    Authorization Type MEDICARE PART B reporting period from 02/12/2021    Progress Note Due on Visit 10    PT Start Time 1035    PT Stop Time 1115    PT Time Calculation (min) 40 min    Activity Tolerance Patient tolerated treatment well    Behavior During Therapy [facility] for tasks assessed/performed             Past Medical History:  Diagnosis Date   Anxiety    Aortic atherosclerosis ([place])    Arthritis    Benign neoplasm of colon, unspecified    Colon polyp    CVA (cerebral vascular accident) ([place]) 2006   Depression    High cholesterol    Hip fracture ([place])    Hyperlipidemia    Hyperplastic colon polyp    Hypertension    Hypothyroid    surgical   IGT (impaired glucose tolerance)    Melanoma ([place])    Migraines    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   Subjective Assessment - 02/28/21 1100     Subjective Patinet reports no pain upon arrival but states she had difficulty with using the bands with HEP yesterday and it took her longer  than she wanted (~ 30 min).    Pertinent History Patient is a 76 y.o. female who presents to outpatient physical therapy with a referral for medical diagnosis neuroma of third interspace of left foot, neuroma of third interspace of right foot, capsulitis of foot, unspecified laterality, osteoarthritis of joints of toes of both feet. This patient's chief complaints consist of bilateral foot pain leading to the following functional deficits: difficulty with any weight bearing activities including walking, going out of the house, exercise routines (walking, exercise classes at the gym), shopping. Her condition also decreases her balance and increases fear of falling. Relevant past medical history and comorbidities include history of stroke (2006, some residual numbness left hand), HTN, PVD, post-op hypothyroidism, carpal tunnel syndrome, osteoporosis, melanoma (4 years ago - currently being checked every 6 months), B TKA, R wrist surgery, former smoker, anxiety/depression (satisfied with current care).  Patient denies hx of seizures, lung problem, major cardiac events, diabetes, unexplained weight loss, changes in bowel or bladder problems, new onset stumbling or dropping things.    Limitations Standing;Walking;House hold activities;Other (comment)  Functional Limitations: difficulty with any weight bearing activities including walking, going out of the house, exercise routines (walking, exercise classes at the gym), shopping. Her condition also decreases her balance and increases fear of falling.   How long can you sit comfortably? not limited    How long can you stand comfortably? 1 hour?    How long can you walk comfortably? She walkes through it up to 30-45 min.    Diagnostic tests B foot radiograph description in provider's note from 06/09/2019: "Radiographs taken today primarily left foot demonstrates an osseously mature individual with the hammertoe deformities. Contralateral foot demonstrates very minimal  changes. She also has a lateral dislocation at the third metatarsophalangeal joint left foot." DXA Bone Density report 12/12/2020: " ASSESSMENT:  The BMD measured at Femur Total Left is 0.719 g/cm2 with a T-score  of -2.3. This patient is considered osteopenic/low bone mass  according to [facility]) criteria."    Patient Stated Goals "keep moving as long as I can"    Currently in Pain? No/denies             OBJECTIVE  SELF-REPORTED FUNCTION FOTO score: 80/100 (foot questionnaire)   TREATMENT:      Therapeutic exercise: to centralize symptoms and improve ROM, strength, muscular endurance, and activity tolerance required for successful completion of functional activities.  - seated toe splays with half YTB looped at great toe and little toe in sling fashion around heel over top of feet (one loop for each toe). 3x10 each side. (provided another set of bands so she can do both feet at once) - seated great toe flexion into floor with YTB resistance, 3x20 each side - seated lesser toe flexion into floor with YTB resistance, 3x15 each side.  - seated figure 4 ankle inversion with YTB, 3x20 each side  - standing SLS with DB pass across front of body, 3x10 each way with touch down UE support, 2#DB    Pt required multimodal cuing for proper technique and to facilitate improved neuromuscular control, strength, range of motion, and functional ability resulting in improved performance and form.   HOME EXERCISE PROGRAM Access Code: [email or web address] URL: [email or web address] Date: 02/26/2021 Prepared by: Rosita Kea   Exercises Toe Spreading - 1 x daily - 3 sets - 10 reps Seated Figure 4 Ankle Inversion with Resistance - 1 x daily - 3 sets - 20 reps   HOME EXERCISE PROGRAM [[email or web address]]   GREAT TOE FLEXION WITH BAND -  Repeat 15 Times, Complete 3 Sets, Perform 1 Times a Day   TOE FLEXION WITH BAND: TOES 2-5  -  Repeat 15 Times, Complete 3 Sets, Perform 1 Times a  Day    PT Education - 02/28/21 1105     Education Details Exercise purpose/form. Self management techniques.    Person(s) Educated Patient    Methods Explanation;Demonstration;Tactile cues;Verbal cues;Handout    Comprehension Verbalized understanding;Returned demonstration;Verbal cues required;Tactile cues required;Need further instruction              PT Short Term Goals - 02/14/21 1110       PT SHORT TERM GOAL #1   Title Be independent with initial home exercise program for self-management of symptoms.    Baseline Initial HEP provided at IE (02/12/2021);    Time 2    Period Weeks    Status Achieved    Target Date 02/27/21               PT Long Term Goals -  02/13/21 0932       PT LONG TERM GOAL #1   Title Be independent with a long-term home exercise program for self-management of symptoms.    Baseline Initial HEP provided at IE (02/12/2021);    Time 12    Period Weeks    Status New   TARGET DATE FOR ALL LONG TERM GOALS: 05/07/2021     PT LONG TERM GOAL #2   Title Demonstrate improved FOTO score by 10 units to demonstrate improvement in overall condition and self-reported functional ability.    Baseline 76 (02/12/2021);    Time 12    Period Weeks    Status New      PT LONG TERM GOAL #3   Title Reduce pain to equal or less than 1/10 with functional activities to allow patient to complete valued functional tasks such as volunteering, walking, and shopping with less difficulty.    Baseline 5/10 (02/12/2021);    Time 12    Period Weeks    Status New      PT LONG TERM GOAL #4   Title Patient with demonstrate ability to balance equal or greater than 30 seconds in SLS on each side to demonstrate improved weight bearing tolerance, proprioception, and foot strength for improved confidence in balance and improved ability to complete weight bearing tasks that require SLS such as walking.    Baseline to be tested visit 2 as appropriate (02/12/2021);    Time 12    Period Weeks     Status New      PT LONG TERM GOAL #5   Title Complete community, work and/or recreational activities without limitation due to current condition.    Baseline Functional Limitations: difficulty with any weight bearing activities including walking, going out of the house, exercise routines (walking, exercise classes at the gym), shopping, wearing shoes. Her condition also decreases her balance and increases fear of falling (02/12/2021);    Time 12    Period Weeks    Status New                   Plan - 02/28/21 1109     Clinical Impression Statement Patient tolerated treatment well overall and reported no soreness following last PT session. She needed significant amount of cuing to set up foot exercises with bands as needed for HEP and this was practiced in session with evidence of learning. Patient continues to report appropriate fatigue with exercises. Patient FOTO Score shows improvement from 76 to 80 out of 100 this session. Patient would benefit from continued management of limiting condition by skilled physical therapist to address remaining impairments and functional limitations to work towards stated goals and return to PLOF or maximal functional independence.    Personal Factors and Comorbidities Past/Current Experience;Time since onset of injury/illness/exacerbation;Comorbidity 3+    Comorbidities Relevant past medical history and comorbidities include history of strake (2006, some residual numbness left hand), HTN, PVD, post-op hypothyroidism, carpal tunnel syndrome, osteoporosis, melanoma (4 years ago - currently being checked every 6 months), B TKA, R wrist surgery, former smoker, anxiety/depression    Examination-Activity Limitations Locomotion Level;Stand;Carry    Examination-Participation Restrictions Community Activity;Occupation;Interpersonal Relationship   difficulty with any weight bearing activities including walking, going out of the house, exercise routines (walking,  exercise classes at the gym), shopping, wearing shoes. Her condition also decreases her balance and increases fear of falling.   Stability/Clinical Decision Making Stable/Uncomplicated    Rehab Potential Good    PT Frequency 2x /  week    PT Duration 12 weeks    PT Treatment/Interventions ADLs/Self Care Home Management;Moist Heat;Cryotherapy;Gait training;Neuromuscular re-education;Balance training;Therapeutic exercise;Therapeutic activities;Patient/family education;Orthotic Fit/Training;Manual techniques;Dry needling    PT Next Visit Plan update HEP as appropriate, strengthening, balance, mobilization as needed    PT Home Exercise Plan Medbridge Access Code: [email or web address]    Consulted and Agree with Plan of Care Patient             Patient will benefit from skilled therapeutic intervention in order to improve the following deficits and impairments:  Abnormal gait, Improper body mechanics, Pain, Postural dysfunction, Decreased activity tolerance, Decreased endurance, Decreased range of motion, Decreased strength, Impaired perceived functional ability, Decreased balance, Difficulty walking, Increased edema  Visit Diagnosis: Pain in right foot  Pain in left foot  Difficulty in walking, not elsewhere classified  Muscle weakness (generalized)     Problem List Patient Active Problem List   Diagnosis Date Noted   Hip fracture ([place]) 02/21/2021   Peripheral vascular disease ([place]) 09/20/2020   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   Cardiac arrhythmia 07/30/2019   Dysuria 07/30/2019   Spasm 07/30/2019   Anxiety 06/09/2019   Essential hypertension 06/09/2019   Greater trochanteric pain syndrome 06/09/2019   Hardening of the aorta (main artery of the heart) ([place]) 05/31/2019   Benign neoplasm of colon 05/31/2019   Major depression, single episode 05/31/2019   Other specified diseases of blood and blood-forming organs 05/24/2019   Dyspnea 12/18/2018    Disorder of bone 05/27/2018   Urinary tract infectious disease 05/27/2018   Impacted cerumen of both ears 05/13/2017   Impacted cerumen 04/16/2017   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[place]) 04/16/2017   Encounter for general adult medical examination without abnormal findings 04/09/2017   Acute bronchitis 01/21/2017   Abnormal glucose tolerance test 12/03/2016   Asthma without status asthmaticus 12/03/2016   Migraine 12/03/2016   Carpal tunnel syndrome 01/31/2016   Paresthesia of hand 01/31/2016   Atrophic vaginitis 05/18/2014   Rectocele 05/18/2014   History of CVA (cerebrovascular accident) 12/29/2013   Impaired fasting glucose 12/29/2013   Family history of diabetes mellitus 01/13/2012   Idiopathic peripheral neuropathy 01/13/2012   Cerebral infarction ([place]) 11/19/2011   Mixed hyperlipidemia 11/19/2011   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   Unilateral primary osteoarthritis, unspecified knee 11/19/2011   Stroke ([place]) 09/04/2005   Everlean Alstrom. Graylon Good, PT, DPT 02/28/21, 11:21 AM   [facility] [address], [place], [address] Phone: [phone]   Fax:  [phone]  Name: MARISEL TOSTENSON MRN: [id number] Date of Birth: 1945/02/06

## 2021-03-05 ENCOUNTER — Encounter: Payer: Self-pay | Admitting: Physical Therapy

## 2021-03-05 ENCOUNTER — Ambulatory Visit: Payer: Medicare Other | Admitting: Physical Therapy

## 2021-03-05 ENCOUNTER — Other Ambulatory Visit: Payer: Self-pay

## 2021-03-05 DIAGNOSIS — M79671 Pain in right foot: Secondary | ICD-10-CM

## 2021-03-05 DIAGNOSIS — M6281 Muscle weakness (generalized): Secondary | ICD-10-CM

## 2021-03-05 DIAGNOSIS — R262 Difficulty in walking, not elsewhere classified: Secondary | ICD-10-CM

## 2021-03-05 DIAGNOSIS — M79672 Pain in left foot: Secondary | ICD-10-CM

## 2021-03-05 NOTE — Therapy (Signed)
[facility] [address], [place], [address] Phone: [phone]   Fax:  [phone]  Physical Therapy Treatment  Patient Details  Name: Rachel Donovan MRN: [id number] Date of Birth: 1945/08/10 Referring Provider (PT): [place], [place] T, [place]   Encounter Date: 03/05/2021   PT End of Session - 03/05/21 1436     Visit Number 7    Number of Visits 24    Date for PT Re-Evaluation 05/07/21    Authorization Type MEDICARE PART B reporting period from 02/12/2021    Progress Note Due on Visit 10    PT Start Time 1434    PT Stop Time 1513    PT Time Calculation (min) 39 min    Activity Tolerance Patient tolerated treatment well    Behavior During Therapy [facility] for tasks assessed/performed             Past Medical History:  Diagnosis Date   Anxiety    Aortic atherosclerosis ([place])    Arthritis    Benign neoplasm of colon, unspecified    Colon polyp    CVA (cerebral vascular accident) ([place]) 2006   Depression    High cholesterol    Hip fracture ([place])    Hyperlipidemia    Hyperplastic colon polyp    Hypertension    Hypothyroid    surgical   IGT (impaired glucose tolerance)    Melanoma ([place])    Migraines    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   Subjective Assessment - 03/05/21 1435     Subjective Patient reports she is feeling well and has no pain upon arrival and no worsening after last PT session. She ordered some xero shoes  and heydude shoes. She states her feet are better. She did not do her HEP yesterday but she has been doing them otherwise.    Pertinent History Patient is a 76 y.o. female who presents to outpatient physical therapy with a referral for medical diagnosis neuroma of third interspace of left foot, neuroma of third interspace of right foot, capsulitis of foot, unspecified laterality, osteoarthritis of joints of toes of both feet. This patient's chief complaints consist of bilateral foot pain leading to the following functional deficits: difficulty with any weight bearing activities including walking, going out of the house, exercise routines (walking, exercise classes at the gym), shopping. Her condition also decreases her balance and increases fear of falling. Relevant past medical history and comorbidities include history of stroke (2006, some residual numbness left hand), HTN, PVD, post-op hypothyroidism, carpal tunnel syndrome, osteoporosis, melanoma (4 years ago - currently being checked every 6 months), B TKA, R wrist surgery, former smoker, anxiety/depression (satisfied with current care).  Patient denies hx of seizures, lung problem, major cardiac events, diabetes, unexplained weight loss, changes in  bowel or bladder problems, new onset stumbling or dropping things.    Limitations Standing;Walking;House hold activities;Other (comment)   Functional Limitations: difficulty with any weight bearing activities including walking, going out of the house, exercise routines (walking, exercise classes at the gym), shopping. Her condition also decreases her balance and increases fear of falling.   How long can you sit comfortably? not limited    How long can you stand comfortably? 1 hour?    How long can you walk comfortably? She walkes through it up to 30-45 min.    Diagnostic tests B foot radiograph description in provider's note from 06/09/2019: "Radiographs taken today primarily left foot demonstrates an osseously  mature individual with the hammertoe deformities. Contralateral foot demonstrates very minimal changes. She also has a lateral dislocation at the third metatarsophalangeal joint left foot." DXA Bone Density report 12/12/2020: " ASSESSMENT:  The BMD measured at Femur Total Left is 0.719 g/cm2 with a T-score  of -2.3. This patient is considered osteopenic/low bone mass  according to [facility]) criteria."    Patient Stated Goals "keep moving as long as I can"    Currently in Pain? No/denies                 TREATMENT:    Therapeutic exercise: to centralize symptoms and improve ROM, strength, muscular endurance, and activity tolerance required for successful completion of functional activities.  - seated toe splays with half YTB looped at great toe and little toe in sling fashion around heel over top of feet (one loop for each toe). 3x10 each side. both feet at once.  - seated great toe flexion into floor with YTB resistance, 2x15 each side - seated lesser toe flexion into floor with YTB resistance, 2x15 each side.  - seated figure 4 ankle inversion with YTB, 2x20 each side.  - standing SLS with DB pass across front of body, 3x10 each way with touch down UE support, 2#DB  - step standing posterior tib activation with YTB under heel and across arch to anchor on floor medial to foot, 2x20 each side (difficulty activating on R > L, improved with practice).  - review of HEP   Pt required multimodal cuing for proper technique and to facilitate improved neuromuscular control, strength, range of motion, and functional ability resulting in improved performance and form.   HOME EXERCISE PROGRAM Access Code: [email or web address] URL: [email or web address] Date: 02/26/2021 Prepared by: Rosita Kea   Exercises Toe Spreading - 1 x daily - 3 sets - 10 reps Seated Figure 4 Ankle Inversion with Resistance - 1 x daily - 3 sets - 20 reps   HOME EXERCISE PROGRAM [[id number]]   GREAT  TOE FLEXION WITH BAND -  Repeat 15 Times, Complete 3 Sets, Perform 1 Times a Day   TOE FLEXION WITH BAND: TOES 2-5  -  Repeat 15 Times, Complete 3 Sets, Perform 1 Times a Day     PT Education - 03/05/21 1436     Education Details Exercise purpose/form. Self management techniques.    Person(s) Educated Patient    Methods Explanation;Demonstration;Tactile cues;Verbal cues    Comprehension Verbalized understanding;Returned demonstration;Verbal cues required;Tactile cues required;Need further instruction              PT Short Term Goals - 02/14/21 1110       PT SHORT TERM GOAL #1   Title Be independent with initial home exercise program for self-management of symptoms.    Baseline Initial HEP provided at IE (  02/12/2021);    Time 2    Period Weeks    Status Achieved    Target Date 02/27/21               PT Long Term Goals - 02/13/21 0932       PT LONG TERM GOAL #1   Title Be independent with a long-term home exercise program for self-management of symptoms.    Baseline Initial HEP provided at IE (02/12/2021);    Time 12    Period Weeks    Status New   TARGET DATE FOR ALL LONG TERM GOALS: 05/07/2021     PT LONG TERM GOAL #2   Title Demonstrate improved FOTO score by 10 units to demonstrate improvement in overall condition and self-reported functional ability.    Baseline 76 (02/12/2021);    Time 12    Period Weeks    Status New      PT LONG TERM GOAL #3   Title Reduce pain to equal or less than 1/10 with functional activities to allow patient to complete valued functional tasks such as volunteering, walking, and shopping with less difficulty.    Baseline 5/10 (02/12/2021);    Time 12    Period Weeks    Status New      PT LONG TERM GOAL #4   Title Patient with demonstrate ability to balance equal or greater than 30 seconds in SLS on each side to demonstrate improved weight bearing tolerance, proprioception, and foot strength for improved confidence in balance and improved  ability to complete weight bearing tasks that require SLS such as walking.    Baseline to be tested visit 2 as appropriate (02/12/2021);    Time 12    Period Weeks    Status New      PT LONG TERM GOAL #5   Title Complete community, work and/or recreational activities without limitation due to current condition.    Baseline Functional Limitations: difficulty with any weight bearing activities including walking, going out of the house, exercise routines (walking, exercise classes at the gym), shopping, wearing shoes. Her condition also decreases her balance and increases fear of falling (02/12/2021);    Time 12    Period Weeks    Status New                   Plan - 03/05/21 1449     Clinical Impression Statement Patient tolerated treatment well this episode of care but continues to need cuing for proper set up for bands and HEP. Continued reinforcing proper set up and form this session and continued with intrinsic foot strengthening and ankle strengthening. Patient to decrease visit frequency to 1x per week due to improving symptoms and independence with HEP. Patient would benefit from continued management of limiting condition by skilled physical therapist to address remaining impairments and functional limitations to work towards stated goals and return to PLOF or maximal functional independence.    Personal Factors and Comorbidities Past/Current Experience;Time since onset of injury/illness/exacerbation;Comorbidity 3+    Comorbidities Relevant past medical history and comorbidities include history of strake (2006, some residual numbness left hand), HTN, PVD, post-op hypothyroidism, carpal tunnel syndrome, osteoporosis, melanoma (4 years ago - currently being checked every 6 months), B TKA, R wrist surgery, former smoker, anxiety/depression    Examination-Activity Limitations Locomotion Level;Stand;Carry    Examination-Participation Restrictions Community Activity;Occupation;Interpersonal  Relationship   difficulty with any weight bearing activities including walking, going out of the house, exercise routines (walking, exercise classes  at the gym), shopping, wearing shoes. Her condition also decreases her balance and increases fear of falling.   Stability/Clinical Decision Making Stable/Uncomplicated    Rehab Potential Good    PT Frequency 2x / week    PT Duration 12 weeks    PT Treatment/Interventions ADLs/Self Care Home Management;Moist Heat;Cryotherapy;Gait training;Neuromuscular re-education;Balance training;Therapeutic exercise;Therapeutic activities;Patient/family education;Orthotic Fit/Training;Manual techniques;Dry needling    PT Next Visit Plan update HEP as appropriate, strengthening, balance, mobilization as needed    PT Home Exercise Plan Medbridge Access Code: [email or web address]    Consulted and Agree with Plan of Care Patient             Patient will benefit from skilled therapeutic intervention in order to improve the following deficits and impairments:  Abnormal gait, Improper body mechanics, Pain, Postural dysfunction, Decreased activity tolerance, Decreased endurance, Decreased range of motion, Decreased strength, Impaired perceived functional ability, Decreased balance, Difficulty walking, Increased edema  Visit Diagnosis: Pain in right foot  Pain in left foot  Difficulty in walking, not elsewhere classified  Muscle weakness (generalized)     Problem List Patient Active Problem List   Diagnosis Date Noted   Hip fracture ([place]) 02/21/2021   Peripheral vascular disease ([place]) 09/20/2020   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   Cardiac arrhythmia 07/30/2019   Dysuria 07/30/2019   Spasm 07/30/2019   Anxiety 06/09/2019   Essential hypertension 06/09/2019   Greater trochanteric pain syndrome 06/09/2019   Hardening of the aorta (main artery of the heart) ([place]) 05/31/2019   Benign neoplasm of colon 05/31/2019   Major  depression, single episode 05/31/2019   Other specified diseases of blood and blood-forming organs 05/24/2019   Dyspnea 12/18/2018   Disorder of bone 05/27/2018   Urinary tract infectious disease 05/27/2018   Impacted cerumen of both ears 05/13/2017   Impacted cerumen 04/16/2017   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[place]) 04/16/2017   Encounter for general adult medical examination without abnormal findings 04/09/2017   Acute bronchitis 01/21/2017   Abnormal glucose tolerance test 12/03/2016   Asthma without status asthmaticus 12/03/2016   Migraine 12/03/2016   Carpal tunnel syndrome 01/31/2016   Paresthesia of hand 01/31/2016   Atrophic vaginitis 05/18/2014   Rectocele 05/18/2014   History of CVA (cerebrovascular accident) 12/29/2013   Impaired fasting glucose 12/29/2013   Family history of diabetes mellitus 01/13/2012   Idiopathic peripheral neuropathy 01/13/2012   Cerebral infarction ([place]) 11/19/2011   Mixed hyperlipidemia 11/19/2011   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   Unilateral primary osteoarthritis, unspecified knee 11/19/2011   Stroke ([place]) 09/04/2005    Everlean Alstrom. Graylon Good, PT, DPT 03/05/21, 3:30 PM   [facility] [address] [place], [address] Phone: [phone]   Fax:  [phone]  Name: Rachel Donovan MRN: [id number] Date of Birth: 11-19-1945

## 2021-03-07 ENCOUNTER — Ambulatory Visit: Payer: Medicare Other | Admitting: Physical Therapy

## 2021-03-12 ENCOUNTER — Other Ambulatory Visit: Payer: Self-pay

## 2021-03-12 ENCOUNTER — Encounter: Payer: Self-pay | Admitting: Physical Therapy

## 2021-03-12 ENCOUNTER — Ambulatory Visit: Payer: Medicare Other | Attending: Podiatry | Admitting: Physical Therapy

## 2021-03-12 DIAGNOSIS — E039 Hypothyroidism, unspecified: Secondary | ICD-10-CM | POA: Insufficient documentation

## 2021-03-12 DIAGNOSIS — M79671 Pain in right foot: Secondary | ICD-10-CM | POA: Insufficient documentation

## 2021-03-12 DIAGNOSIS — M6281 Muscle weakness (generalized): Secondary | ICD-10-CM | POA: Insufficient documentation

## 2021-03-12 DIAGNOSIS — R262 Difficulty in walking, not elsewhere classified: Secondary | ICD-10-CM | POA: Insufficient documentation

## 2021-03-12 DIAGNOSIS — M79672 Pain in left foot: Secondary | ICD-10-CM | POA: Insufficient documentation

## 2021-03-12 NOTE — Therapy (Signed)
[facility] [address], [place], [address] Phone: [phone]   Fax:  [phone]  Physical Therapy Treatment / Discharge Summary Dates of reporting from 02/12/2021 to 03/12/2021 / Patient Details  Name: Rachel Donovan MRN: [id number] Date of Birth: 02-23-1945 Referring Provider (PT): [place], [place], [place]   Encounter Date: 03/12/2021   PT End of Session - 03/12/21 1555     Visit Number 8    Number of Visits 24    Date for PT Re-Evaluation 05/07/21    Authorization Type MEDICARE PART B reporting period from 02/12/2021    Progress Note Due on Visit 10    PT Start Time 1515    PT Stop Time 1555    PT Time Calculation (min) 40 min    Activity Tolerance Patient tolerated treatment well    Behavior During Therapy [facility] for tasks assessed/performed             Past Medical History:  Diagnosis Date   Anxiety    Aortic atherosclerosis ([place])    Arthritis    Benign neoplasm of colon, unspecified    Colon polyp    CVA (cerebral vascular accident) ([place]) 2006   Depression    High cholesterol    Hip fracture ([place])    Hyperlipidemia    Hyperplastic colon polyp    Hypertension    Hypothyroid    surgical   IGT (impaired glucose tolerance)    Melanoma ([place])    Migraines    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   Subjective Assessment - 03/12/21 1523     Subjective Patient reports she is feeling well and her feet continue to feel  better. She does not remember having pain over the last two weeks. She got some xero shoes that she is wearing in. She reports she is participating well in her HEP. She feels comfortable discharging from PT today.    Pertinent History Patient is a 76 y.o. female who presents to outpatient physical therapy with a referral for medical diagnosis neuroma of third interspace of left foot, neuroma of third interspace of right foot, capsulitis of foot, unspecified laterality, osteoarthritis of joints of toes of both feet. This patient's chief complaints consist of bilateral foot pain leading to the following functional deficits: difficulty with any weight bearing activities including walking, going out of the house, exercise routines (walking, exercise classes at the gym), shopping. Her condition also decreases her balance and increases fear of falling. Relevant past medical history and comorbidities include history of stroke (2006, some residual numbness left hand), HTN, PVD, post-op hypothyroidism, carpal tunnel syndrome, osteoporosis, melanoma (4 years ago - currently being checked every 6 months), B TKA, R wrist surgery, former smoker, anxiety/depression (satisfied with current care).  Patient denies hx  of seizures, lung problem, major cardiac events, diabetes, unexplained weight loss, changes in bowel or bladder problems, new onset stumbling or dropping things.    Limitations Standing;Walking;House hold activities;Other (comment)   Functional Limitations: difficulty with any weight bearing activities including walking, going out of the house, exercise routines (walking, exercise classes at the gym), shopping. Her condition also decreases her balance and increases fear of falling.   How long can you sit comfortably? not limited    How long can you stand comfortably? 1 hour?    How long can you walk comfortably? She walkes through it up to 30-45 min.    Diagnostic tests B foot radiograph description in provider's  note from 06/09/2019: "Radiographs taken today primarily left foot demonstrates an osseously mature individual with the hammertoe deformities. Contralateral foot demonstrates very minimal changes. She also has a lateral dislocation at the third metatarsophalangeal joint left foot." DXA Bone Density report 12/12/2020: " ASSESSMENT:  The BMD measured at Femur Total Left is 0.719 g/cm2 with a T-score  of -2.3. This patient is considered osteopenic/low bone mass  according to [facility]) criteria."    Patient Stated Goals "keep moving as long as I can"    Currently in Pain? No/denies    Effect of Pain on Daily Activities pateint reports she no longer has pain so it no longer stops her from the things that challenged her prior.            OBJECTIVE  SELF-REPORTED FUNCTION FOTO score: 83/100 (foot questionnaire)  SLS Balance (best of 2 trials with less distraction) R = 29 seconds L = 26 seconds  TREATMENT:    Therapeutic exercise: to centralize symptoms and improve ROM, strength, muscular endurance, and activity tolerance required for successful completion of functional activities.  - seated toe splays with half YTB looped at great toe and little toe in sling fashion around heel over top of feet (one loop for each toe). 3x10 each side. both feet at once.  - single leg stance practice 4 reps each side.  - seated great toe flexion into floor with YTB resistance, 1x20 each side - seated lesser toe flexion into floor with YTB resistance, 1x20 each side.  - seated figure 4 ankle inversion with RTB, 2x20 each side.  - review of HEP   Pt required multimodal cuing for proper technique and to facilitate improved neuromuscular control, strength, range of motion, and functional ability resulting in improved performance and form.   HOME EXERCISE PROGRAM Access Code: [email or web address] URL: [email or web address] Date: 02/26/2021 Prepared by: Rosita Kea   Exercises Toe  Spreading - 1 x daily - 3 sets - 10 reps Seated Figure 4 Ankle Inversion with Resistance - 1 x daily - 3 sets - 20 reps   HOME EXERCISE PROGRAM [[email or web address]]   GREAT TOE FLEXION WITH BAND -  Repeat 15 Times, Complete 3 Sets, Perform 1 Times a Day   TOE FLEXION WITH BAND: TOES 2-5  -  Repeat 15 Times, Complete 3 Sets, Perform 1 Times a Day    PEN PENNY -  Repeat 20 Times, Hold 4 Seconds, Complete 1 Set, Perform 1 Times a Day   PT Education - 03/12/21 1557     Education Details Exercise purpose/form. Self management techniques. discharge reccomendations    Person(s) Educated Patient    Methods Explanation;Demonstration;Tactile cues;Verbal cues    Comprehension Verbalized understanding;Tactile cues required;Verbal cues required              PT  Short Term Goals - 02/14/21 1110       PT SHORT TERM GOAL #1   Title Be independent with initial home exercise program for self-management of symptoms.    Baseline Initial HEP provided at IE (02/12/2021);    Time 2    Period Weeks    Status Achieved    Target Date 02/27/21               PT Long Term Goals - 03/12/21 1553       PT LONG TERM GOAL #1   Title Be independent with a long-term home exercise program for self-management of symptoms.    Baseline Initial HEP provided at IE (02/12/2021); currently participating in appropriate long term HEP (03/12/2021);    Time 12    Period Weeks    Status Achieved   TARGET DATE FOR ALL LONG TERM GOALS: 05/07/2021     PT LONG TERM GOAL #2   Title Demonstrate improved FOTO score by 10 units to demonstrate improvement in overall condition and self-reported functional ability.    Baseline 76 (02/12/2021); 80 at visit 6 (02/28/2021); 83 at visit 8 (03/12/2021);    Time 12    Period Weeks    Status Partially Met      PT LONG TERM GOAL #3   Title Reduce pain to equal or less than 1/10 with functional activities to allow patient to complete valued functional tasks such as volunteering, walking, and  shopping with less difficulty.    Baseline 5/10 (02/12/2021); no pain in over 2 weeks (03/12/2021);    Time 12    Period Weeks    Status Achieved      PT LONG TERM GOAL #4   Title Patient with demonstrate ability to balance equal or greater than 30 seconds in SLS on each side to demonstrate improved weight bearing tolerance, proprioception, and foot strength for improved confidence in balance and improved ability to complete weight bearing tasks that require SLS such as walking.    Baseline to be tested visit 2 as appropriate (02/12/2021); R = 29 seconds  L = 26 seconds (03/12/2021);    Time 12    Period Weeks    Status Partially Met      PT LONG TERM GOAL #5   Title Complete community, work and/or recreational activities without limitation due to current condition.    Baseline Functional Limitations: difficulty with any weight bearing activities including walking, going out of the house, exercise routines (walking, exercise classes at the gym), shopping, wearing shoes. Her condition also decreases her balance and increases fear of falling (02/12/2021); reports no limitations due to foot pain (03/12/2021);    Time 12    Period Weeks    Status Achieved                   Plan - 03/12/21 1557     Clinical Impression Statement Patient has attended 8 physical therapy sessions since starting this episode of care on 02/12/2021. Patient has made excellent progress and has met or nearly met all goals. She is now discharged to a long term HEP due to improvement in condition.    Personal Factors and Comorbidities Past/Current Experience;Time since onset of injury/illness/exacerbation;Comorbidity 3+    Comorbidities Relevant past medical history and comorbidities include history of strake (2006, some residual numbness left hand), HTN, PVD, post-op hypothyroidism, carpal tunnel syndrome, osteoporosis, melanoma (4 years ago - currently being checked every 6 months), B TKA, R wrist surgery, former  smoker,  anxiety/depression    Examination-Activity Limitations Locomotion Level;Stand;Carry    Examination-Participation Restrictions Community Activity;Occupation;Interpersonal Relationship   difficulty with any weight bearing activities including walking, going out of the house, exercise routines (walking, exercise classes at the gym), shopping, wearing shoes. Her condition also decreases her balance and increases fear of falling.   Stability/Clinical Decision Making Stable/Uncomplicated    Rehab Potential Good    PT Frequency 2x / week    PT Duration 12 weeks    PT Treatment/Interventions ADLs/Self Care Home Management;Moist Heat;Cryotherapy;Gait training;Neuromuscular re-education;Balance training;Therapeutic exercise;Therapeutic activities;Patient/family education;Orthotic Fit/Training;Manual techniques;Dry needling    PT Next Visit Plan Patient is now discharged from [facility] Access Code: [email or web address]    Consulted and Agree with Plan of Care Patient             Patient will benefit from skilled therapeutic intervention in order to improve the following deficits and impairments:  Abnormal gait, Improper body mechanics, Pain, Postural dysfunction, Decreased activity tolerance, Decreased endurance, Decreased range of motion, Decreased strength, Impaired perceived functional ability, Decreased balance, Difficulty walking, Increased edema  Visit Diagnosis: Pain in right foot  Pain in left foot  Difficulty in walking, not elsewhere classified  Muscle weakness (generalized)     Problem List Patient Active Problem List   Diagnosis Date Noted   Hip fracture ([place]) 02/21/2021   Peripheral vascular disease ([place]) 09/20/2020   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   Cardiac arrhythmia 07/30/2019   Dysuria 07/30/2019   Spasm 07/30/2019   Anxiety 06/09/2019   Essential hypertension 06/09/2019   Greater trochanteric pain  syndrome 06/09/2019   Hardening of the aorta (main artery of the heart) ([place]) 05/31/2019   Benign neoplasm of colon 05/31/2019   Major depression, single episode 05/31/2019   Other specified diseases of blood and blood-forming organs 05/24/2019   Dyspnea 12/18/2018   Disorder of bone 05/27/2018   Urinary tract infectious disease 05/27/2018   Impacted cerumen of both ears 05/13/2017   Impacted cerumen 04/16/2017   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[place]) 04/16/2017   Encounter for general adult medical examination without abnormal findings 04/09/2017   Acute bronchitis 01/21/2017   Abnormal glucose tolerance test 12/03/2016   Asthma without status asthmaticus 12/03/2016   Migraine 12/03/2016   Carpal tunnel syndrome 01/31/2016   Paresthesia of hand 01/31/2016   Atrophic vaginitis 05/18/2014   Rectocele 05/18/2014   History of CVA (cerebrovascular accident) 12/29/2013   Impaired fasting glucose 12/29/2013   Family history of diabetes mellitus 01/13/2012   Idiopathic peripheral neuropathy 01/13/2012   Cerebral infarction ([place]) 11/19/2011   Mixed hyperlipidemia 11/19/2011   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   Unilateral primary osteoarthritis, unspecified knee 11/19/2011   Stroke ([place]) 09/04/2005    Everlean Alstrom. Graylon Good, PT, DPT 03/12/21, 4:04 PM  [facility] [address] [place], [address] Phone: [phone]   Fax:  [phone]  Name: Rachel Donovan MRN: [id number] Date of Birth: Jan 19, 1946

## 2021-03-14 ENCOUNTER — Ambulatory Visit: Payer: Medicare Other | Admitting: Physical Therapy

## 2021-03-16 ENCOUNTER — Telehealth: Payer: Self-pay

## 2021-03-16 NOTE — Telephone Encounter (Signed)
Pt is calling to report hip/back pains that she has never experienced before until starting fosamax. Expressed that she would like another option if possible. Please advise.

## 2021-03-19 ENCOUNTER — Other Ambulatory Visit: Payer: Self-pay

## 2021-03-19 ENCOUNTER — Ambulatory Visit (INDEPENDENT_AMBULATORY_CARE_PROVIDER_SITE_OTHER): Payer: Medicare Other | Admitting: Pharmacist

## 2021-03-19 VITALS — BP 120/70 | HR 65

## 2021-03-19 DIAGNOSIS — I10 Essential (primary) hypertension: Secondary | ICD-10-CM

## 2021-03-19 DIAGNOSIS — R252 Cramp and spasm: Secondary | ICD-10-CM | POA: Diagnosis not present

## 2021-03-19 LAB — BASIC METABOLIC PANEL
BUN/Creatinine Ratio: 21 (ref 12–28)
BUN: 18 mg/dL (ref 8–27)
CO2: 26 mmol/L (ref 20–29)
Calcium: 10.3 mg/dL (ref 8.7–10.3)
Chloride: 93 mmol/L — ABNORMAL LOW (ref 96–106)
Creatinine, Ser: 0.87 mg/dL (ref 0.57–1.00)
Glucose: 95 mg/dL (ref 70–99)
Potassium: 4.7 mmol/L (ref 3.5–5.2)
Sodium: 134 mmol/L (ref 134–144)
eGFR: 69 mL/min/{1.73_m2} (ref >59)

## 2021-03-19 LAB — MAGNESIUM: Magnesium: 1.9 mg/dL (ref 1.6–2.3)

## 2021-03-19 NOTE — Telephone Encounter (Signed)
It is possible for Fosamax to be associated with MS pain. If she doesn't feel there is another possible cause, then I would recommend that she stop the Fosamax.  If women who don't tolerate bisphosphonate's, Prolia is another option (2nd line medication after bisphosphonate's).  Prolia is an injection she would need to get every 6 months. Studies have shown an increase risk in vertebral fractures after stopping prolia, so if she starts it she will likely need to continue on it. She should also not miss doses.  Prolia is generally well tolerated. There is a small risk of MS pain, elevation of cholesterol and bladder infections. Most people do very well on it.  Please review this with her. She is welcome to come in to discuss this further.

## 2021-03-19 NOTE — Telephone Encounter (Signed)
Patient informed with below, message sent to appointments to schedule office visit to discuss.

## 2021-03-19 NOTE — Patient Instructions (Signed)
Please continue hydrochlorothiazide 25mg  daily, metoprolol succinate 25mg  daily, losartan 100mg  daily, furosemide 20mg  daily as needed, felodipine 10mg  daily  Please call me at [phone] with any questions

## 2021-03-19 NOTE — Telephone Encounter (Signed)
Patient scheduled on 03/22/21 to discuss Prolia.

## 2021-03-19 NOTE — Progress Notes (Signed)
Patient ID: Rachel Donovan                 DOB: 06/22/1945                      MRN: [id number]     HPI: SHALICIA CRAGHEAD is a 76 y.o. female referred by Dr. Acie Fredrickson to HTN clinic. PMH is significant for CVA, hyperlipidemia, HDL, hypothyroidism and coronary artery calcifications (103). She was seen in Dec by Dr. Acie Fredrickson. BP was 146/76. HCTZ 25mg  daily was started. BMP was rechecked. K and scr good, Na was slightly low at 133.  Patient presents today HTN clinic today. She does not check her BP at home as it causes her too much stress and makes her BP go up. She denies dizziness, lightheadedness, headache, blurred vision, SOB. Her swelling is better. Wears compression stockings 1/2 the time since she found ones that the toes are exposed. Is active and tries to walk daily. Her only complaint is muscle cramps.   Current HTN meds: hydrochlorothiazide 25mg  daily, metoprolol succinate 25mg  daily, losartan 100mg  daily, furosemide 20mg  daily as needed, felodipine 10mg  daily Previously tried:  BP goal: <130/80  Family History: The patient's family history includes Arthritis in her brother, father, and mother; Heart attack in her brother; Heart disease in her brother, father, and mother; Hypertension in her brother, father, and mother; Kidney disease in her mother; Stroke in her mother; Thyroid cancer in her son.  Social History: quite smoking >40 years ago, occasional wine  Diet: 2 cups coffee in AM, tea or water with meals, soda 3 times per week  Exercise: walks 30 min daily if weather is nice. Line dancing on Mondays  Home BP readings: none  Wt Readings from Last 3 Encounters:  01/19/21 150 lb 12.8 oz (68.4 kg)  12/25/20 150 lb 12.8 oz (68.4 kg)  09/20/20 150 lb 9.6 oz (68.3 kg)   BP Readings from Last 3 Encounters:  01/19/21 (!) 146/76  12/25/20 140/80  09/20/20 126/72   Pulse Readings from Last 3 Encounters:  01/19/21 76  12/25/20 64  09/20/20 70    Renal function: CrCl cannot be calculated  (Patient's most recent lab result is older than the maximum 21 days allowed.).  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 Take first thing in am with 6 oz. Water.  Be upright after taking.  Eat nothing for one hour. 12 tablet 3   atorvastatin (LIPITOR) 20 MG tablet atorvastatin 20 mg tablet     Calcium Carbonate-Vit D-Min (CALCIUM 1200 PO) Take by mouth daily.  dipyridamole-aspirin (AGGRENOX) 200-25 MG 12hr capsule Take 1 capsule by mouth 2 (two) times daily.      felodipine (PLENDIL) 10 MG 24 hr tablet Take 10 mg by mouth daily.     Folic Acid-Vit F0-XNA T55 (FABB) 2.2-25-1 MG TABS Take 1 tablet by mouth daily.     furosemide (LASIX) 20 MG tablet Take 20 mg by mouth daily as needed.      hydrochlorothiazide (HYDRODIURIL) 25 MG tablet Take 1 tablet (25 mg total) by mouth daily. 90 tablet 3   levothyroxine (SYNTHROID, LEVOTHROID) 125 MCG tablet Take 125 mcg by mouth every other day.   2   levothyroxine (SYNTHROID, LEVOTHROID) 137 MCG tablet TK 1 T PO EVERY MORNING BEFORE BREAKFAST ALTERNATING WITH 125MCG T  1   losartan (COZAAR) 100 MG tablet losartan 100 mg tablet     metoprolol succinate (TOPROL-XL) 50 MG 24 hr tablet Take 1 tablet (50 mg total) by mouth daily. Need appointment 30 tablet 0    Multiple Vitamin (GNP ESSENTIAL ONE DAILY) TABS Take by mouth daily.      venlafaxine XR (EFFEXOR-XR) 75 MG 24 hr capsule Take 1 tablet by mouth daily. (Patient not taking: Reported on 02/12/2021)     Current Facility-Administered Medications on File Prior to Visit  Medication Dose Route Frequency Provider Last Rate Last Admin   0.9 %  sodium chloride infusion  500 mL Intravenous Once [place], MD        Allergies  Allergen Reactions   Codeine Hives   Penicillins Hives   Sulfa Antibiotics Hives     Assessment/Plan:  1. Hypertension - Blood pressure is at goal of <130/80 in clinic today. No home readings available and patient wishes not to check BP at home. I advised she limit her caffeine to 2 cups per day. Continue hydrochlorothiazide 25mg  daily, metoprolol succinate 25mg  daily, losartan 100mg  daily, furosemide 20mg  daily as needed, felodipine 10mg  daily. Check BMP again today to make sure Na has not dropped any lower. Will also check Mag level since pt complains of cramps. Follow up as needed.   Thanks,  Ramond Dial, Pharm.D, BCPS, CPP [facility]  [address] [place], [place] [address]  Phone: [phone]; Fax: [phone]

## 2021-03-20 NOTE — Telephone Encounter (Signed)
Patient called back today (03/20/21) stating she will give the fosamax another try. Patient said if she doesn't see much improvement symptoms wise she will call and schedule OV with Dr.Jertson to discuss Prolia. She would like to give it more time.   She asked I cancel the appointment scheduled for 03/22/21.

## 2021-03-22 ENCOUNTER — Institutional Professional Consult (permissible substitution): Payer: Medicare Other | Admitting: Obstetrics and Gynecology

## 2021-03-23 ENCOUNTER — Encounter: Payer: Self-pay | Admitting: Cardiovascular Disease

## 2021-05-03 ENCOUNTER — Encounter: Payer: Self-pay | Admitting: Obstetrics and Gynecology

## 2021-05-03 ENCOUNTER — Ambulatory Visit (INDEPENDENT_AMBULATORY_CARE_PROVIDER_SITE_OTHER): Payer: Medicare Other | Admitting: Obstetrics and Gynecology

## 2021-05-03 ENCOUNTER — Ambulatory Visit: Payer: Medicare Other | Admitting: Obstetrics and Gynecology

## 2021-05-03 VITALS — BP 122/64 | HR 74 | Ht 61.0 in | Wt 147.0 lb

## 2021-05-03 DIAGNOSIS — T887XXA Unspecified adverse effect of drug or medicament, initial encounter: Secondary | ICD-10-CM

## 2021-05-03 DIAGNOSIS — M810 Age-related osteoporosis without current pathological fracture: Secondary | ICD-10-CM

## 2021-05-03 NOTE — Progress Notes (Signed)
GYNECOLOGY  VISIT ?  ?HPI: ?76 y.o.   Married White or Caucasian Not Hispanic or Latino  female   ?G1P1001 with No LMP recorded. Patient has had a hysterectomy.   ?here to f/u on osteoporosis. Her DEXA from 11/22 returned with a T score of -2.3 with a FRAX of 15.2/4.4%. She was started on Fosamax. ?She started noticing pain in her mid to lower back and bilateral hips in January. The pain starts the day she takes the medicine and it's resolved by the time she takes it again.  ? ?She had normal lab work prior to starting the fosamax, including a normal calcium and vit d level. ? ?She is on calcium and vit d and exercising most days.  ? ?H/O a stroke. ? ?GYNECOLOGIC HISTORY: ?No LMP recorded. Patient has had a hysterectomy. ?Contraception:pmp and hysterectomy  ?Menopausal hormone therapy: none  ?       ?OB History   ? ? Gravida  ?1  ? Para  ?1  ? Term  ?1  ? Preterm  ?0  ? AB  ?0  ? Living  ?1  ?  ? ? SAB  ?0  ? IAB  ?0  ? Ectopic  ?0  ? Multiple  ?0  ? Live Births  ?1  ?   ?  ?  ?    ? ?Patient Active Problem List  ? Diagnosis Date Noted  ? Hypothyroidism 03/12/2021  ? Bilateral hearing loss 10/19/2020  ? Peripheral vascular disease ([place]) 09/20/2020  ? 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  ? Cardiac arrhythmia 07/30/2019  ? Dysuria 07/30/2019  ? Spasm 07/30/2019  ? Anxiety 06/09/2019  ? Essential hypertension 06/09/2019  ? Greater trochanteric pain syndrome 06/09/2019  ? Hardening of the aorta (main artery of the heart) ([place]) 05/31/2019  ? Benign neoplasm of colon 05/31/2019  ? Major depression, single episode 05/31/2019  ? Other specified diseases of blood and blood-forming organs 05/24/2019  ? Dyspnea 12/18/2018  ? Disorder of bone 05/27/2018  ? Urinary tract infectious disease 05/27/2018  ? Impacted cerumen of both ears 05/13/2017  ? Impacted cerumen 04/16/2017  ? 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 Encounter for general adult medical examination without  abnormal findings 04/09/2017  ? Acute bronchitis 01/21/2017  ? Abnormal glucose tolerance test 12/03/2016  ? Asthma without status asthmaticus 12/03/2016  ? Migraine 12/03/2016  ? Carpal tunnel syndrome 01/31/2016  ? Paresthesia of hand 01/31/2016  ? Atrophic vaginitis 05/18/2014  ? Rectocele 05/18/2014  ? History of CVA (cerebrovascular accident) 12/29/2013  ? Impaired fasting glucose 12/29/2013  ? Family history of diabetes mellitus 01/13/2012  ? Idiopathic peripheral neuropathy 01/13/2012  ? Cerebral infarction ([place]) 11/19/2011  ? Mixed hyperlipidemia 11/19/2011  ? 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 Unilateral primary osteoarthritis, unspecified knee 11/19/2011  ? Stroke [facility]) 09/04/2005  ? ? ?Past Medical History:  ?Diagnosis Date  ? Anxiety   ? Aortic atherosclerosis ([place])   ? Arthritis   ? Benign neoplasm of colon, unspecified   ? Colon polyp   ? CVA (cerebral vascular accident) [facility]) 2006  ? Depression   ? High cholesterol   ? Hyperlipidemia   ? Hyperplastic colon polyp   ? Hypertension   ? Hypothyroid   ? surgical  ? IGT (impaired glucose tolerance)   ? Melanoma ([place])   ? Migraines   ? 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 OA (osteoarthritis)   ?  PVD (peripheral vascular disease) ([place])   ? Rectocele   ? Shingles   ? ? ?Past Surgical History:  ?Procedure Laterality Date  ? ABDOMINAL HYSTERECTOMY    ? BREAST BIOPSY Right   ? X 2  ? BREAST BIOPSY Left   ? BREAST SURGERY    ? BX breast lumps X 3  ? CATARACT EXTRACTION, BILATERAL    ? COLONOSCOPY    ? PARTIAL HYSTERECTOMY    ? REPLACEMENT TOTAL KNEE Bilateral   ? RIGHT WRIST TENDON    ? THYROIDECTOMY    ? ? ?Current Outpatient Medications  ?Medication Sig Dispense Refill  ? atorvastatin (LIPITOR) 20 MG tablet atorvastatin 20 mg tablet    ? Calcium Carbonate-Vit D-Min (CALCIUM 1200 PO) Take by mouth daily.    ? dipyridamole-aspirin (AGGRENOX) 200-25 MG 12hr capsule Take 1 capsule by mouth 2 (two) times daily.     ? felodipine (PLENDIL) 10  MG 24 hr tablet Take 10 mg by mouth daily.    ? Folic Acid-Vit D7-OEU M35 (FABB) 2.2-25-1 MG TABS Take 1 tablet by mouth daily.    ? furosemide (LASIX) 20 MG tablet Take 20 mg by mouth daily as needed.     ? hydrochlorothiazide (HYDRODIURIL) 25 MG tablet Take 1 tablet (25 mg total) by mouth daily. 90 tablet 3  ? levothyroxine (SYNTHROID, LEVOTHROID) 125 MCG tablet Take 125 mcg by mouth every other day.   2  ? levothyroxine (SYNTHROID, LEVOTHROID) 137 MCG tablet TK 1 T PO EVERY MORNING BEFORE BREAKFAST ALTERNATING WITH 125MCG T  1  ? losartan (COZAAR) 100 MG tablet losartan 100 mg tablet    ? metoprolol succinate (TOPROL-XL) 50 MG 24 hr tablet Take 1 tablet (50 mg total) by mouth daily. Need appointment 30 tablet 0  ? Multiple Vitamin (GNP ESSENTIAL ONE DAILY) TABS Take by mouth daily.     ? alendronate (FOSAMAX) 70 MG tablet Take 70 mg by mouth once a week. (Patient not taking: Reported on 05/03/2021)    ? ?Current Facility-Administered Medications  ?Medication Dose Route Frequency Provider Last Rate Last Admin  ? 0.9 %  sodium chloride infusion  500 mL Intravenous Once [place], MD      ?  ? ?ALLERGIES: Codeine, Penicillins, and Sulfa antibiotics ? ?Family History  ?Problem Relation Age of Onset  ? Stroke Mother   ? Hypertension Mother   ? Heart disease Mother   ? Arthritis Mother   ? Kidney disease Mother   ? Hypertension Father   ? Heart disease Father   ? Arthritis Father   ? Hypertension Brother   ? Heart attack Brother   ? Heart disease Brother   ? Arthritis Brother   ? Thyroid cancer Son   ? ? ?Social History  ? ?Socioeconomic History  ? Marital status: Married  ?  Spouse name: Not on file  ? Number of children: Not on file  ? Years of education: Not on file  ? Highest education level: Not on file  ?Occupational History  ? Not on file  ?Tobacco Use  ? Smoking status: Former  ?  Packs/day: 0.50  ?  Years: 12.00  ?  Pack years: 6.00  ?  Types: Cigarettes  ?  Quit date: 02/04/1978  ?  Years since quitting:  43.2  ? Smokeless tobacco: Never  ?Vaping Use  ? Vaping Use: Never used  ?Substance and Sexual Activity  ? Alcohol use: Yes  ?  Comment: occassional beer with pizza  ?  Drug use: No  ? Sexual activity: Not Currently  ?  Partners: Male  ?  Birth control/protection: Post-menopausal  ?Other Topics Concern  ? Not on file  ?Social History Narrative  ? Not on file  ? ?Social Determinants of Health  ? ?Financial Resource Strain: Not on file  ?Food Insecurity: Not on file  ?Transportation Needs: Not on file  ?Physical Activity: Not on file  ?Stress: Not on file  ?Social Connections: Not on file  ?Intimate Partner Violence: Not on file  ? ? ?ROS ? ?PHYSICAL EXAMINATION:   ? ?BP 122/64 (BP Location: Left Arm, Patient Position: Sitting, Cuff Size: Normal)   Pulse 74   Ht '5\' 1"'$  (1.549 m)   Wt 147 lb (66.7 kg)   SpO2 99%   BMI 27.78 kg/m?     ?General appearance: alert, cooperative and appears stated age ? ?1. Age-related osteoporosis without current pathological fracture ?DEXA from 11/22 returned with a T score of -2.3, FRAX 15.2/4.4%. Given her risk of hip fracture over 3% treatment was recommended. ?-She has developed back and hip pain with the Fosamax ?-Not a candidate for Evista ?-We discussed the option of Prolia. Reviewed that MS pain is a potential side effect, we also discussed the risk of rapid bone loss if she stops the Prolia ?-She is getting 1,200 mg of calcium daily with vit D ?-She is exercising regularly  ?-She declines medication at this time ?-Recommend repeating her DEXA in 11/23, if she is having further bone loss it will influence her decision to start a different medication. ? ?2. Medication side effect ?See above ? ?Over 30 minutes in total patient care ?

## 2021-05-22 ENCOUNTER — Telehealth: Payer: Self-pay | Admitting: *Deleted

## 2021-05-22 NOTE — Telephone Encounter (Signed)
? ?  Pre-operative Risk Assessment  ?  ?Patient Name: Rachel Donovan  ?DOB: [birth year] ?MRN: [id number]  ? ?  ? ?Request for Surgical Clearance   ? ?Procedure:   B/L UPPER EYELID BLEPHAROPLASTY ? ?Date of Surgery:  Clearance 06/04/21                              ?   ?Surgeon:  DR. Isidoro Donning  ?Surgeon's Group or Practice Name:  [facility] ?Phone number:  [phone] ?Fax number:  [phone] ?  ?Type of Clearance Requested:   ?- Medical  ?- Pharmacy:  Hold    AGGRENOX  ?  ?Type of Anesthesia:  MAC ?  ?Additional requests/questions:   ? ?Signed, ?Julaine Hua   ?05/22/2021, 12:35 PM  ? ?

## 2021-05-22 NOTE — Telephone Encounter (Signed)
Left message in regard to Aggrenox, will need to reach out to prescribing MD; not managed by cardiology. Left message to call back and confirm if still need cardiac clearance from our office.  ?

## 2021-05-22 NOTE — Telephone Encounter (Signed)
? ? ?  Name: Rachel Donovan  ?DOB: May 01, 1945  ?MRN: [id number] ? ?Primary Cardiologist: Ida Rogue, MD ? ? ?Chart reviewed as part of pre-operative protocol coverage. Please let requesting surgeon know we do not prescribe or manage this patient's Aggrenox. Do they still need [place] to conduct a preop evaluation? They will still need to reach out to the provider that manages this medicine for her. Appears she is on this for history of TIA but not prescribed through our office. ? ?If they still want general preop input, please contact this patient and set up a phone call appointment for further preoperative risk assessment. Please obtain consent and complete medication review. Thank you for your help. ? ? ?Charlie Pitter, PA-C ?05/22/2021, 12:57 PM ?[facility] ?[address] ?[place], [place] [address] ? ? ? ?

## 2021-05-24 NOTE — Telephone Encounter (Signed)
I s/w the pt. I explained to her that Dr. Lorina Rabon will need to reach out to PCP in regard to clearance for the aggrenox. I did stated to the pt that I have left a message 2 days ago for Dr. Lorina Rabon office to please call back and let us know if they still need cardiac clearance as well. I told her that I had not heard back from them but I will call them again today.  ? ?I left message for the requesting office to please call back and confirm if they need cardiac clearance still or were they just needing the clearance for the Aggrenox. If for just the Aggrenox they will need to reach out to PCP. If need cardiac clearance they will need to let our office know as we will need an appt with the pt. If appt is needed pt agreeable to tele pre op appt.  ? ?I will fax these notes to requesting office to please reply.  ?

## 2021-05-24 NOTE — Telephone Encounter (Signed)
? ?  [facility], she said, pt doesn't need cardiac clearance and to cancel this request ?

## 2021-05-24 NOTE — Telephone Encounter (Signed)
Left message for the pt that we will not need to have a tele pre op appt.  ?

## 2021-05-24 NOTE — Telephone Encounter (Signed)
I tried to reach the requesting again so that I may know if I need to make an appt for the if Dr. Lorina Rabon is going to need  cardiac clearance as well. Pleas esse other notes .  ?

## 2021-07-22 ENCOUNTER — Encounter: Payer: Self-pay | Admitting: Cardiovascular Disease

## 2021-07-22 NOTE — Progress Notes (Unsigned)
Cardiology Office Note:    Date:  07/23/2021   ID:  Rachel Donovan, DOB 05/04/1945, MRN [id number]  PCP:  Reynold Bowen, MD   [facility] Providers Cardiologist:  Ida Rogue, MD     Referring MD: Reynold Bowen, MD   Chief Complaint  Patient presents with   Hypertension          History of Present Illness:    Rachel Donovan is a 76 y.o. female with a hx of history of CVA, hyperlipidemia and coronary artery calcifications.  She is previously been seen by Dr. Rockey Situ in 2020.  Hx of HLD , hypothyroidism   Coronary calcium score of 103. This was 46 percentile for age and sex matched control.  CVA in 2006.  Possibly a TIA  - is on Aggrenox Occasional episodes of CP behind both breasts Gets some exercise Cp only lasts for seconds  Goes to a class on mondays .   Exercise  does not bring on these cp   BP is a bit elevated.  Eats salt Takes furosemide on occasion Has some ankle edema at night .  Typically goes down bu morning   She admits that she eats too much salt Will add HCTZ 25 mg a day   July 23, 2021 Pt is seen for follow up of HTN, TIA, coronary artery calcifications  Is not feeling well.   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    OA (osteoarthritis)    PVD (peripheral vascular disease) ([facility])    Rectocele    Shingles     Past Surgical History:  Procedure Laterality Date   ABDOMINAL HYSTERECTOMY     BREAST BIOPSY Right    X 2   BREAST BIOPSY Left     BREAST SURGERY     BX breast lumps X 3   CATARACT EXTRACTION, BILATERAL     COLONOSCOPY     PARTIAL HYSTERECTOMY     REPLACEMENT TOTAL KNEE Bilateral    RIGHT WRIST TENDON     THYROIDECTOMY      Current Medications: Current Meds  Medication Sig   atorvastatin (LIPITOR) 20 MG tablet atorvastatin 20 mg tablet   Calcium Carbonate-Vit D-Min (CALCIUM 1200 PO) Take by mouth daily.   dipyridamole-aspirin (AGGRENOX) 200-25 MG 12hr capsule Take 1 capsule by mouth 2 (two) times daily.    felodipine (PLENDIL) 10 MG 24 hr tablet Take 10 mg by mouth daily.   Folic Acid-Vit W1-UXN A35 (FABB) 2.2-25-1 MG TABS Take 1 tablet by mouth daily.   furosemide (LASIX) 20 MG tablet Take 20 mg by mouth daily as needed.    hydrochlorothiazide (HYDRODIURIL) 25 MG tablet Take 1 tablet (25 mg total) by mouth daily.   levothyroxine (SYNTHROID, LEVOTHROID) 125 MCG tablet Take 125 mcg by mouth every other day.    levothyroxine (SYNTHROID, LEVOTHROID) 137 MCG  tablet TK 1 T PO EVERY MORNING BEFORE BREAKFAST ALTERNATING WITH 125MCG T   losartan (COZAAR) 100 MG tablet losartan 100 mg tablet   metoprolol succinate (TOPROL-XL) 50 MG 24 hr tablet Take 1 tablet (50 mg total) by mouth daily. Need appointment   Multiple Vitamin (GNP ESSENTIAL ONE DAILY) TABS Take by mouth daily.    Current Facility-Administered Medications for the 07/23/21 encounter (Office Visit) with Albertine Lafoy, Wonda Cheng, MD  Medication   0.9 %  sodium chloride infusion     Allergies:   Codeine, Penicillins, and Sulfa antibiotics   Social History   Socioeconomic History   Marital status: Married    Spouse name: Not on file   Number of children: Not on file   Years of education: Not on file   Highest education level: Not on file  Occupational History   Not on file  Tobacco Use   Smoking status: Former    Packs/day: 0.50    Years: 12.00    Total pack years: 6.00    Types: Cigarettes    Quit date: 02/04/1978    Years since quitting: 43.4   Smokeless  tobacco: Never  Vaping Use   Vaping Use: Never used  Substance and Sexual Activity   Alcohol use: Yes    Comment: occassional beer with pizza   Drug use: No   Sexual activity: Not Currently    Partners: Male    Birth control/protection: Post-menopausal  Other Topics Concern   Not on file  Social History Narrative   Not on file   Social Determinants of Health   Financial Resource Strain: Not on file  Food Insecurity: Not on file  Transportation Needs: Not on file  Physical Activity: Not on file  Stress: Not on file  Social Connections: Not on file    Family History: The patient's family history includes Arthritis in her brother, father, and mother; Heart attack in her brother; Heart disease in her brother, father, and mother; Hypertension in her brother, father, and mother; Kidney disease in her mother; Stroke in her mother; Thyroid cancer in her son.  ROS:   Please see the history of present illness.     All other systems reviewed and are negative.  EKGs/Labs/Other Studies Reviewed:    The following studies were reviewed today:  EKG:      Recent Labs: 03/19/2021: BUN 18; Creatinine, Ser 0.87; Magnesium 1.9; Potassium 4.7; Sodium 134  Recent Lipid Panel No results found for: "CHOL", "TRIG", "HDL", "CHOLHDL", "VLDL", "LDLCALC", "LDLDIRECT"   Risk Assessment/Calculations:     Physical Exam:     Physical Exam: Blood pressure 112/80, pulse 62, height '5\' 2"'$  (1.575 m), weight 139 lb 3.2 oz (63.1 kg), SpO2 97 %.  GEN:  Well nourished, well developed in no acute distress HEENT: Normal NECK: No JVD; No carotid bruits LYMPHATICS: No lymphadenopathy CARDIAC: RRR , no murmurs, rubs, gallops RESPIRATORY:  Clear to auscultation without rales, wheezing or rhonchi  ABDOMEN: Soft, non-tender, non-distended MUSCULOSKELETAL:  No edema; No deformity  SKIN: Warm and dry NEUROLOGIC:  Alert and oriented x 3   ASSESSMENT:    1. Mixed hyperlipidemia   2. Primary hypertension    3. Coronary artery calcification     PLAN:       Hypertension: Blood pressures well controlled.  Continue diet, exercise, current medications  2.  Hyperlipidemia: Continue current meds.  3.  Coronary artery calcifications: He is not having any episodes of angina.  Continue current plan.  Medication Adjustments/Labs and Tests Ordered: Current medicines are reviewed at length with the patient today.  Concerns regarding medicines are outlined above.  No orders of the defined types were placed in this encounter.   No orders of the defined types were placed in this encounter.    Patient Instructions  Medication Instructions:  Your physician recommends that you continue on your current medications as directed. Please refer to the Current Medication list given to you today. *If you need a refill on your cardiac medications before your next appointment, please call your pharmacy*   Lab Work: NONE If you have labs (blood work) drawn today and your tests are completely normal, you will receive your results only by: [place] (if you have MyChart) OR A paper copy in the mail If you have any lab test that is abnormal or we need to change your treatment, we will call you to review the results.   Testing/Procedures: NONE   Follow-Up: At [facility], you and your health needs are our priority.  As part of our continuing mission to provide you with exceptional heart care, we have created designated Provider Care Teams.  These Care Teams include your primary Cardiologist (physician) and Advanced Practice Providers (APPs -  Physician Assistants and Nurse Practitioners) who all work together to provide you with the care you need, when you need it.  Your next appointment:   1 year(s)  The format for your next appointment:   In Person  Provider:   Ronn Melena, or Kein Carlberg {  Important Information About Sugar         Signed, Mertie Moores, MD  07/23/2021  10:58 AM    [place]

## 2021-07-23 ENCOUNTER — Encounter: Payer: Self-pay | Admitting: Cardiovascular Disease

## 2021-07-23 ENCOUNTER — Ambulatory Visit (INDEPENDENT_AMBULATORY_CARE_PROVIDER_SITE_OTHER): Payer: Medicare Other | Admitting: Cardiovascular Disease

## 2021-07-23 VITALS — BP 112/80 | HR 62 | Ht 62.0 in | Wt 139.2 lb

## 2021-07-23 DIAGNOSIS — E782 Mixed hyperlipidemia: Secondary | ICD-10-CM

## 2021-07-23 DIAGNOSIS — I10 Essential (primary) hypertension: Secondary | ICD-10-CM

## 2021-07-23 DIAGNOSIS — I2584 Coronary atherosclerosis due to calcified coronary lesion: Secondary | ICD-10-CM | POA: Diagnosis not present

## 2021-07-23 DIAGNOSIS — I2510 Atherosclerotic heart disease of native coronary artery without angina pectoris: Secondary | ICD-10-CM | POA: Diagnosis not present

## 2021-07-23 NOTE — Patient Instructions (Signed)
Medication Instructions:  Your physician recommends that you continue on your current medications as directed. Please refer to the Current Medication list given to you today.  *If you need a refill on your cardiac medications before your next appointment, please call your pharmacy*   Lab Work: NONE If you have labs (blood work) drawn today and your tests are completely normal, you will receive your results only by: MyChart Message (if you have MyChart) OR A paper copy in the mail If you have any lab test that is abnormal or we need to change your treatment, we will call you to review the results.   Testing/Procedures: NONE   Follow-Up: At [facility], you and your health needs are our priority.  As part of our continuing mission to provide you with exceptional heart care, we have created designated Provider Care Teams.  These Care Teams include your primary Cardiologist (physician) and Advanced Practice Providers (APPs -  Physician Assistants and Nurse Practitioners) who all work together to provide you with the care you need, when you need it.  Your next appointment:   1 year(s)  The format for your next appointment:   In Person  Provider:   [place], [place], or [place] {     Important Information About Sugar       

## 2021-09-07 ENCOUNTER — Other Ambulatory Visit: Payer: Self-pay | Admitting: Obstetrics and Gynecology

## 2021-09-07 DIAGNOSIS — Z1231 Encounter for screening mammogram for malignant neoplasm of breast: Secondary | ICD-10-CM

## 2021-09-12 ENCOUNTER — Other Ambulatory Visit: Payer: Self-pay | Admitting: Endocrinology

## 2021-09-12 DIAGNOSIS — M546 Pain in thoracic spine: Secondary | ICD-10-CM

## 2021-09-17 ENCOUNTER — Ambulatory Visit
Admission: RE | Admit: 2021-09-17 | Discharge: 2021-09-17 | Disposition: A | Discharge status: Home / Self Care | Payer: Medicare Other | Source: Ambulatory Visit | Attending: Endocrinology | Admitting: Endocrinology

## 2021-09-17 DIAGNOSIS — M546 Pain in thoracic spine: Secondary | ICD-10-CM

## 2021-09-18 ENCOUNTER — Telehealth: Payer: Self-pay | Admitting: Cardiovascular Disease

## 2021-09-18 NOTE — Telephone Encounter (Signed)
Imaging in pt's chart from yesterday's spinal CT : There is mild calcified atherosclerotic plaque in theaortic arch as well as coronary artery calcifications.  Called to inform patient that this is on file for her here-CAD and specifically coronary artery calcifications, no new information or need to be concerned. Pt understands.

## 2021-09-18 NOTE — Telephone Encounter (Signed)
New Message:   Patient said she had a CT of her mid back yesterday. She would like for Dr Acie Fredrickson to please review her results in My-Chart. She said it showed something about her heart and needs to know if he thinks she needs to follow up on it please.Rachel Donovan

## 2021-10-23 ENCOUNTER — Ambulatory Visit
Admission: RE | Admit: 2021-10-23 | Discharge: 2021-10-23 | Disposition: A | Discharge status: Home / Self Care | Payer: Medicare Other | Source: Ambulatory Visit | Attending: Obstetrics and Gynecology | Admitting: Obstetrics and Gynecology

## 2021-10-23 DIAGNOSIS — Z1231 Encounter for screening mammogram for malignant neoplasm of breast: Secondary | ICD-10-CM

## 2021-10-25 ENCOUNTER — Other Ambulatory Visit: Payer: Self-pay | Admitting: Obstetrics and Gynecology

## 2021-10-25 DIAGNOSIS — R928 Other abnormal and inconclusive findings on diagnostic imaging of breast: Secondary | ICD-10-CM

## 2021-10-31 ENCOUNTER — Ambulatory Visit
Admission: RE | Admit: 2021-10-31 | Discharge: 2021-10-31 | Disposition: A | Discharge status: Home / Self Care | Payer: Medicare Other | Source: Ambulatory Visit | Attending: Obstetrics and Gynecology | Admitting: Obstetrics and Gynecology

## 2021-10-31 ENCOUNTER — Other Ambulatory Visit: Payer: Self-pay | Admitting: Obstetrics and Gynecology

## 2021-10-31 DIAGNOSIS — R928 Other abnormal and inconclusive findings on diagnostic imaging of breast: Secondary | ICD-10-CM

## 2021-10-31 DIAGNOSIS — N6489 Other specified disorders of breast: Secondary | ICD-10-CM

## 2021-12-05 ENCOUNTER — Ambulatory Visit
Admission: RE | Admit: 2021-12-05 | Discharge: 2021-12-05 | Disposition: A | Discharge status: Home / Self Care | Payer: Medicare Other | Source: Ambulatory Visit | Attending: Obstetrics and Gynecology | Admitting: Obstetrics and Gynecology

## 2021-12-05 DIAGNOSIS — N6489 Other specified disorders of breast: Secondary | ICD-10-CM

## 2021-12-13 ENCOUNTER — Ambulatory Visit
Admission: RE | Admit: 2021-12-13 | Discharge: 2021-12-13 | Disposition: A | Discharge status: Home / Self Care | Payer: Medicare Other | Source: Ambulatory Visit | Attending: Obstetrics and Gynecology | Admitting: Obstetrics and Gynecology

## 2021-12-13 DIAGNOSIS — M810 Age-related osteoporosis without current pathological fracture: Secondary | ICD-10-CM

## 2021-12-13 DIAGNOSIS — T887XXA Unspecified adverse effect of drug or medicament, initial encounter: Secondary | ICD-10-CM

## 2021-12-26 ENCOUNTER — Ambulatory Visit: Payer: Medicare Other | Admitting: Obstetrics and Gynecology

## 2022-01-04 ENCOUNTER — Other Ambulatory Visit: Payer: Self-pay

## 2022-01-04 MED ORDER — HYDROCHLOROTHIAZIDE 25 MG PO TABS: 25.0000 mg | ORAL_TABLET | Freq: Every day | ORAL | 1 refills | Status: DC

## 2022-01-07 ENCOUNTER — Encounter: Payer: Self-pay | Admitting: Obstetrics and Gynecology

## 2022-01-07 ENCOUNTER — Ambulatory Visit (INDEPENDENT_AMBULATORY_CARE_PROVIDER_SITE_OTHER): Payer: Medicare Other | Admitting: Obstetrics and Gynecology

## 2022-01-07 VITALS — BP 134/80 | HR 72 | Ht 61.0 in | Wt 129.0 lb

## 2022-01-07 DIAGNOSIS — R634 Abnormal weight loss: Secondary | ICD-10-CM

## 2022-01-07 DIAGNOSIS — I2584 Coronary atherosclerosis due to calcified coronary lesion: Secondary | ICD-10-CM | POA: Diagnosis not present

## 2022-01-07 DIAGNOSIS — M810 Age-related osteoporosis without current pathological fracture: Secondary | ICD-10-CM | POA: Diagnosis not present

## 2022-01-07 DIAGNOSIS — I2510 Atherosclerotic heart disease of native coronary artery without angina pectoris: Secondary | ICD-10-CM | POA: Diagnosis not present

## 2022-01-07 MED ORDER — ALENDRONATE SODIUM 70 MG PO TABS: 70.0000 mg | ORAL_TABLET | ORAL | 3 refills | Status: DC

## 2022-01-07 NOTE — Patient Instructions (Signed)

## 2022-01-07 NOTE — Progress Notes (Signed)
GYNECOLOGY  VISIT   HPI: 76 y.o.   Married White or Caucasian Not Hispanic or Latino  female   G1P1001 with No LMP recorded. Patient has had a hysterectomy.   here to discuss her DEXA from last month that returned with a T score of -2.6.   Her DEXA from 11/22 returned with a T score of -2.3, FRAX was 15.2/4.4%. She had normal lab work at that time and was started on Fosamax. She started developing MS pain in January. The pain started the day she started the medication and resolved by the time she was due to take it again.  Since she stopped taking the Fosamax.   She has a h/o a stroke.   She had a recent TSH with Dr Forde Dandy, hasn't had screening lab work with Dr Forde Dandy for over 6 months.   She reports a 10 lb weight loss over the last year. Appetite is down. She is not depressed. She is worried that her husband may have early dementia (being evaluated).   GYNECOLOGIC HISTORY: No LMP recorded. Patient has had a hysterectomy. 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   Hardening of the aorta (main artery of the heart) ([place]) 05/31/2019   Benign neoplasm of colon 05/31/2019   Major depression, single episode 05/31/2019   Other specified diseases of blood and blood-forming organs 05/24/2019   Dyspnea 12/18/2018   Disorder of bone 05/27/2018   Urinary tract infectious disease  05/27/2018   Impacted cerumen of both ears 05/13/2017   Impacted cerumen 04/16/2017   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[place]) 04/16/2017   Encounter for general adult medical examination without abnormal findings 04/09/2017   Acute bronchitis 01/21/2017   Abnormal glucose tolerance test 12/03/2016   Asthma without status asthmaticus 12/03/2016   Migraine 12/03/2016   Carpal tunnel syndrome 01/31/2016   Paresthesia of hand 01/31/2016   Atrophic vaginitis 05/18/2014   Rectocele 05/18/2014   History of CVA (cerebrovascular accident) 12/29/2013   Impaired fasting glucose 12/29/2013   Family history of diabetes mellitus 01/13/2012   Idiopathic peripheral neuropathy 01/13/2012   Cerebral infarction ([place]) 11/19/2011   Mixed hyperlipidemia 11/19/2011   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   Unilateral primary osteoarthritis, unspecified knee 11/19/2011   Stroke ([place]) 09/04/2005    Past Medical History:  Diagnosis Date   Anxiety    Aortic atherosclerosis ([place])    Arthritis    Benign neoplasm of colon, unspecified    Colon polyp    CVA (cerebral vascular accident) ([place]) 2006   Depression    High cholesterol    Hyperlipidemia    Hyperplastic colon polyp  Hypertension    Hypothyroid    surgical   IGT (impaired glucose tolerance)    Melanoma ([facility])    Migraines    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    OA (osteoarthritis)    PVD (peripheral vascular disease) ([facility])    Rectocele    Shingles     Past Surgical History:  Procedure Laterality Date   ABDOMINAL HYSTERECTOMY     BREAST BIOPSY Right    X 2   BREAST BIOPSY Left    BREAST SURGERY     BX breast lumps X 3   CATARACT EXTRACTION, BILATERAL     COLONOSCOPY     PARTIAL HYSTERECTOMY     REPLACEMENT TOTAL KNEE Bilateral    RIGHT WRIST TENDON     THYROIDECTOMY      Current Outpatient Medications  Medication Sig Dispense Refill   atorvastatin (LIPITOR) 20 MG tablet atorvastatin 20 mg tablet      Calcium Carbonate-Vit D-Min (CALCIUM 1200 PO) Take by mouth daily.     dipyridamole-aspirin (AGGRENOX) 200-25 MG 12hr capsule Take 1 capsule by mouth 2 (two) times daily.      felodipine (PLENDIL) 10 MG 24 hr tablet Take 10 mg by mouth daily.     Folic Acid-Vit E0-CXK G81 (FABB) 2.2-25-1 MG TABS Take 1 tablet by mouth daily.     furosemide (LASIX) 20 MG tablet Take 20 mg by mouth daily as needed.      hydrochlorothiazide (HYDRODIURIL) 25 MG tablet Take 1 tablet (25 mg total) by mouth daily. 90 tablet 1   levothyroxine (SYNTHROID, LEVOTHROID) 125 MCG tablet Take 125 mcg by mouth every other day.   2   levothyroxine (SYNTHROID, LEVOTHROID) 137 MCG tablet TK 1 T PO EVERY MORNING BEFORE BREAKFAST ALTERNATING WITH 125MCG T  1   losartan (COZAAR) 100 MG tablet losartan 100 mg tablet     metoprolol succinate (TOPROL-XL) 50 MG 24 hr tablet Take 1 tablet (50 mg total) by mouth daily. Need appointment 30 tablet 0   Multiple Vitamin (GNP ESSENTIAL ONE DAILY) TABS Take by mouth daily.      Current Facility-Administered Medications  Medication Dose Route Frequency Provider Last Rate Last Admin   0.9 %  sodium chloride infusion  500 mL Intravenous Once [place], MD         ALLERGIES: Codeine, Penicillins, and Sulfa antibiotics  Family History  Problem Relation Age of Onset   Stroke Mother    Hypertension Mother    Heart disease Mother    Arthritis Mother    Kidney disease Mother    Hypertension Father    Heart disease Father    Arthritis Father    Hypertension Brother    Heart attack Brother    Heart disease Brother    Arthritis Brother    Thyroid cancer Son     Social History   Socioeconomic History   Marital status: Married    Spouse name: Not on file   Number of children: Not on file   Years of education: Not on file   Highest education level: Not on file  Occupational History   Not on file  Tobacco Use   Smoking status: Former    Packs/day: 0.50    Years: 12.00     Total pack years: 6.00    Types: Cigarettes    Quit date: 02/04/1978    Years since quitting: 43.9   Smokeless tobacco: Never  Vaping Use   Vaping Use: Never  used  Substance and Sexual Activity   Alcohol use: Yes    Comment: occassional beer with pizza   Drug use: No   Sexual activity: Not Currently    Partners: Male    Birth control/protection: Post-menopausal  Other Topics Concern   Not on file  Social History Narrative   Not on file   Social Determinants of Health   Financial Resource Strain: Not on file  Food Insecurity: Not on file  Transportation Needs: Not on file  Physical Activity: Not on file  Stress: Not on file  Social Connections: Not on file  Intimate Partner Violence: Not on file    ROS  PHYSICAL EXAMINATION:    There were no vitals taken for this visit.    General appearance: alert, cooperative and appears stated age  51. Age-related osteoporosis without current pathological fracture -She was started on Fosamax in 11/22 and stopped it by 3/23 secondary to MS pain, now she thinks that the MS may have been unrelated to the Fosamax (the pain has persisted) -She has had worsening osteoporosis in the last year -H/O stroke, so not a candidate for Evista -Discussed Prolia and retrying Fosamax. We discussed Reclast, but I wouldn't recommend she start with that. - CBC - Comprehensive metabolic panel - VITAMIN D 25 Hydroxy (Vit-D Deficiency, Fractures) - Phosphorus - alendronate (FOSAMAX) 70 MG tablet; Take 1 tablet (70 mg total) by mouth every 7 (seven) days. Take first thing in am with 6 oz. Water.  Be upright after taking.  Eat nothing for one hour.  Dispense: 12 tablet; Refill: 3 -Information from [facility] on Osteoporosis was given to the patient  2. Weight loss - CBC Recent TSH with her primary If she continues to loose weight she will f/u with her primary

## 2022-01-08 LAB — COMPREHENSIVE METABOLIC PANEL
AG Ratio: 2 (calc) (ref 1.0–2.5)
ALT: 17 U/L (ref 6–29)
AST: 19 U/L (ref 10–35)
Albumin: 4.6 g/dL (ref 3.6–5.1)
Alkaline phosphatase (APISO): 95 U/L (ref 37–153)
BUN: 15 mg/dL (ref 7–25)
CO2: 27 mmol/L (ref 20–32)
Calcium: 9.8 mg/dL (ref 8.6–10.4)
Chloride: 89 mmol/L — ABNORMAL LOW (ref 98–110)
Creat: 0.99 mg/dL (ref 0.60–1.00)
Globulin: 2.3 g/dL (calc) (ref 1.9–3.7)
Glucose, Bld: 112 mg/dL — ABNORMAL HIGH (ref 65–99)
Potassium: 3.8 mmol/L (ref 3.5–5.3)
Sodium: 129 mmol/L — ABNORMAL LOW (ref 135–146)
Total Bilirubin: 1.1 mg/dL (ref 0.2–1.2)
Total Protein: 6.9 g/dL (ref 6.1–8.1)

## 2022-01-08 LAB — CBC
HCT: 41.6 % (ref 35.0–45.0)
Hemoglobin: 14.6 g/dL (ref 11.7–15.5)
MCH: 32.8 pg (ref 27.0–33.0)
MCHC: 35.1 g/dL (ref 32.0–36.0)
MCV: 93.5 fL (ref 80.0–100.0)
MPV: 10.5 fL (ref 7.5–12.5)
Platelets: 231 10*3/uL (ref 140–400)
RBC: 4.45 10*6/uL (ref 3.80–5.10)
RDW: 12.2 % (ref 11.0–15.0)
WBC: 6.5 10*3/uL (ref 3.8–10.8)

## 2022-01-08 LAB — PHOSPHORUS: Phosphorus: 3.3 mg/dL (ref 2.1–4.3)

## 2022-01-08 LAB — VITAMIN D 25 HYDROXY (VIT D DEFICIENCY, FRACTURES): Vit D, 25-Hydroxy: 45 ng/mL (ref 30–100)

## 2022-02-07 ENCOUNTER — Telehealth: Payer: Self-pay

## 2022-02-07 NOTE — Telephone Encounter (Signed)
Patient called stating she was returning a call.  I spoke with her and let her know that I could not find anything anyone had called her about.  We reviewed her 01/12/22 result note and she has already started the Fosamax and doing well.

## 2022-02-12 ENCOUNTER — Telehealth: Payer: Self-pay

## 2022-02-12 NOTE — Telephone Encounter (Signed)
Patient called in voice mail stating she is not able to take Fosamax. She said she has only taken 2 pills and she doesn't think it is the medicine but more her thoughts about taking it. "Not doing well on it emotionally."  I called and spoke with her.  She said she just has a feeling that she should not be taking this medication.  She also mentioned that it is challenging trying to figure out how to take it with her thyroid med directions and BP med and might need someone to sit down with her and all her medications to help her with it.  She said when she sets up her meds for the week she gets so emotional about it.  I told her I would relay to Dr. Talbert Nan that she does not want to take it.

## 2022-02-14 NOTE — Telephone Encounter (Signed)
Does she want to come in to discuss other options?

## 2022-02-19 NOTE — Telephone Encounter (Signed)
Patient called back. I offered her office visit to discuss options. She said she is doing fine now and took the Fosamax this week. She will call if she decides she cannot take it and schedule visit.

## 2022-02-19 NOTE — Telephone Encounter (Signed)
Called patient and man who answered said she was unavailable and asked me to call back.

## 2022-04-16 ENCOUNTER — Other Ambulatory Visit (HOSPITAL_COMMUNITY): Payer: Self-pay | Admitting: *Deleted

## 2022-04-18 ENCOUNTER — Encounter (HOSPITAL_COMMUNITY)
Admission: RE | Admit: 2022-04-18 | Discharge: 2022-04-18 | Disposition: A | Discharge status: Home / Self Care | Payer: Medicare Other | Source: Ambulatory Visit | Attending: Endocrinology | Admitting: Endocrinology

## 2022-04-18 DIAGNOSIS — M810 Age-related osteoporosis without current pathological fracture: Secondary | ICD-10-CM | POA: Diagnosis not present

## 2022-04-18 MED ORDER — DENOSUMAB 60 MG/ML ~~LOC~~ SOSY
60.0000 mg | PREFILLED_SYRINGE | Freq: Once | SUBCUTANEOUS | Status: AC
  Administered 2022-04-18: 60 mg via SUBCUTANEOUS

## 2022-04-18 MED ORDER — DENOSUMAB 60 MG/ML ~~LOC~~ SOSY
PREFILLED_SYRINGE | SUBCUTANEOUS | Status: AC
  Filled 2022-04-18: qty 1

## 2022-04-23 ENCOUNTER — Other Ambulatory Visit: Payer: Self-pay | Admitting: Endocrinology

## 2022-04-23 DIAGNOSIS — N6489 Other specified disorders of breast: Secondary | ICD-10-CM

## 2022-04-24 IMAGING — MG MM DIGITAL SCREENING BILAT W/ TOMO AND CAD
6 of 10 series · 6 of 30 positions shown · non-contrast
Comparison: Previous exam(s).

CLINICAL DATA: Screening.

EXAM:
DIGITAL SCREENING BILATERAL MAMMOGRAM WITH TOMOSYNTHESIS AND CAD
TECHNIQUE: Bilateral screening digital craniocaudal and mediolateral oblique
mammograms were obtained. Bilateral screening digital breast
tomosynthesis was performed. The images were evaluated with
computer-aided detection.

[L MLO synth-2D (1 of 2)]
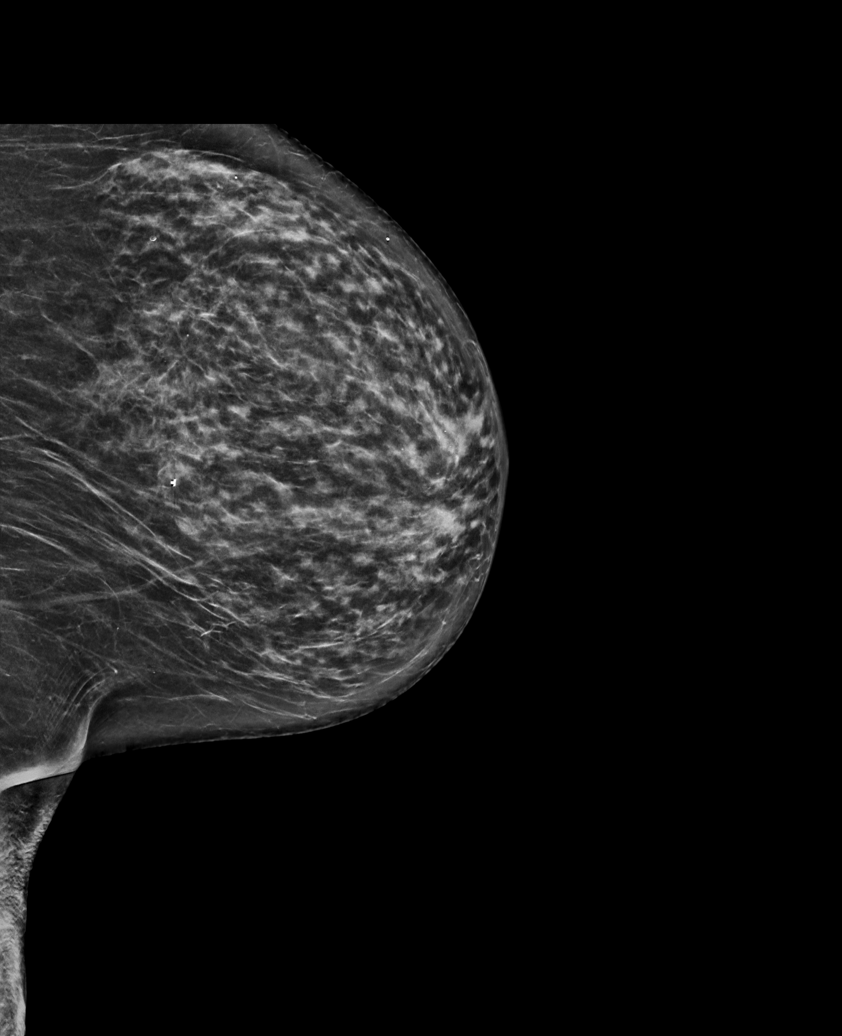

[L MLO synth-2D (2 of 2)]
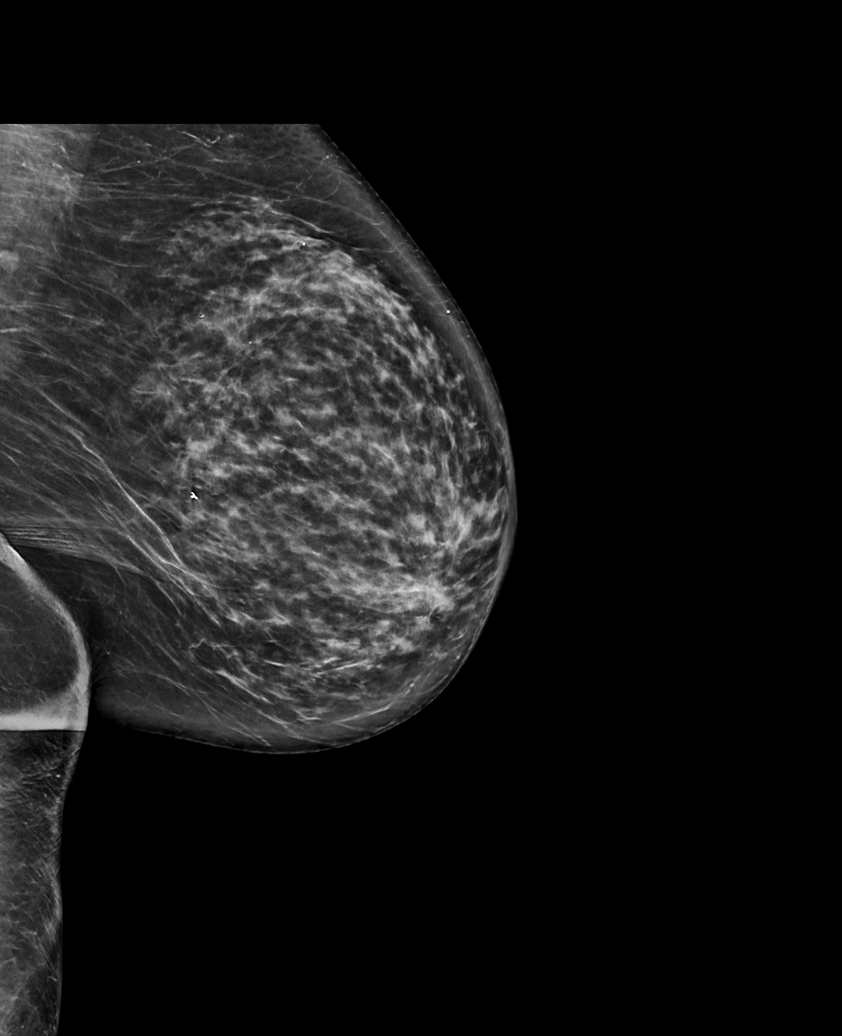

[R CC synth-2D]
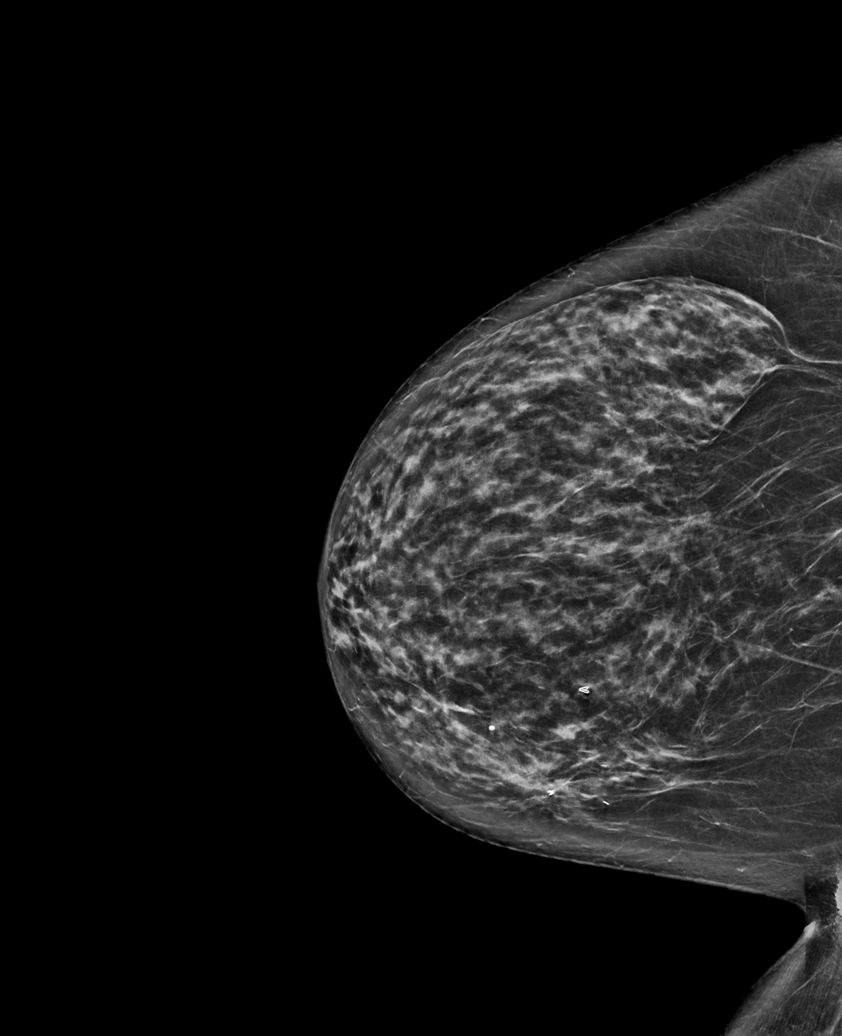

[R MLO synth-2D]
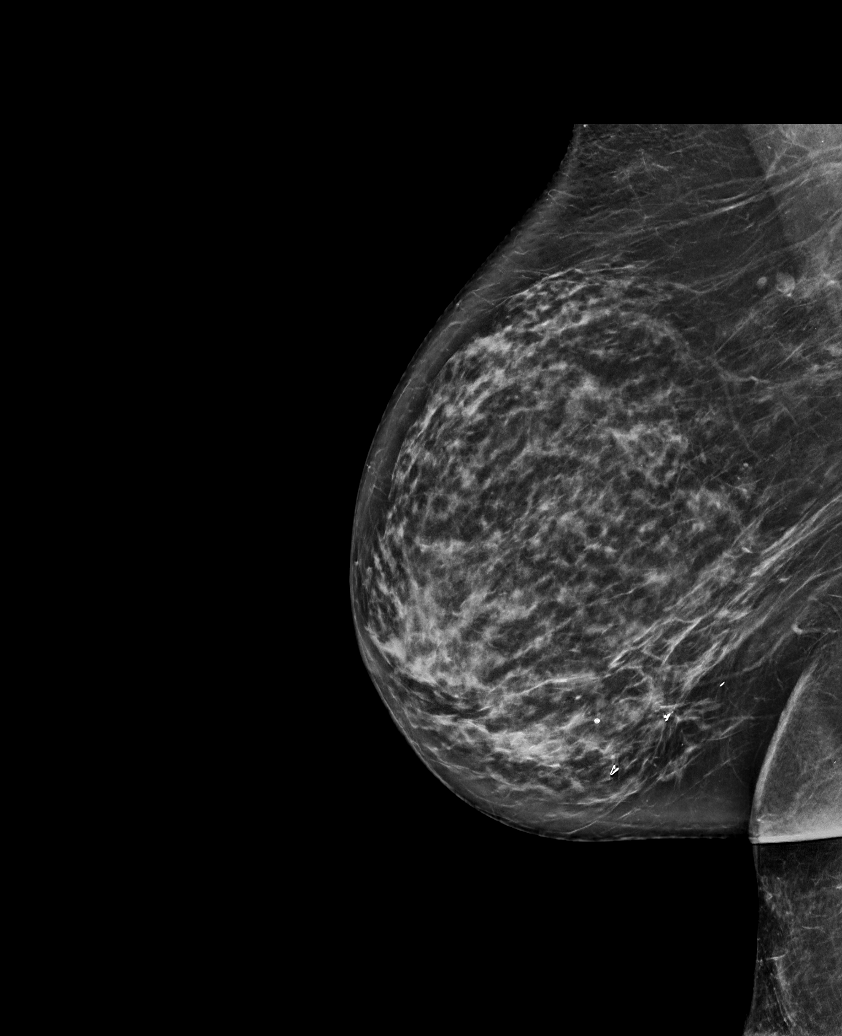

[L CC synth-2D]
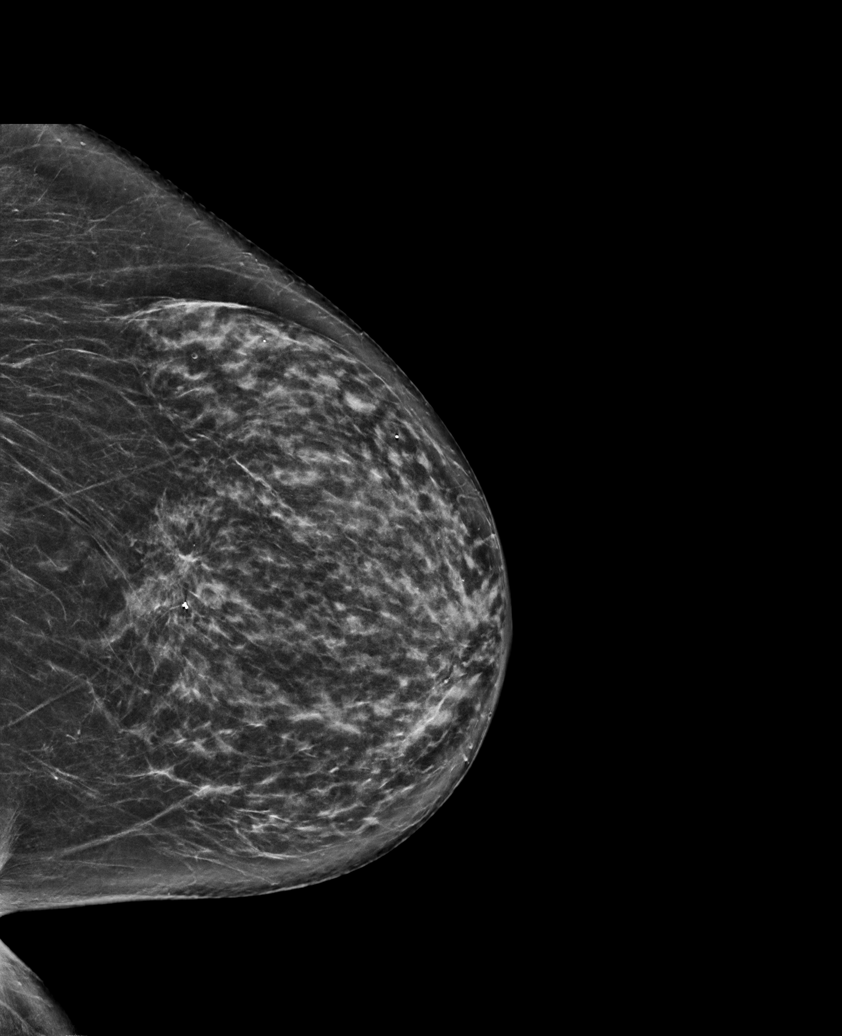

[L MLO tomo · tomo slice 39/78.0]
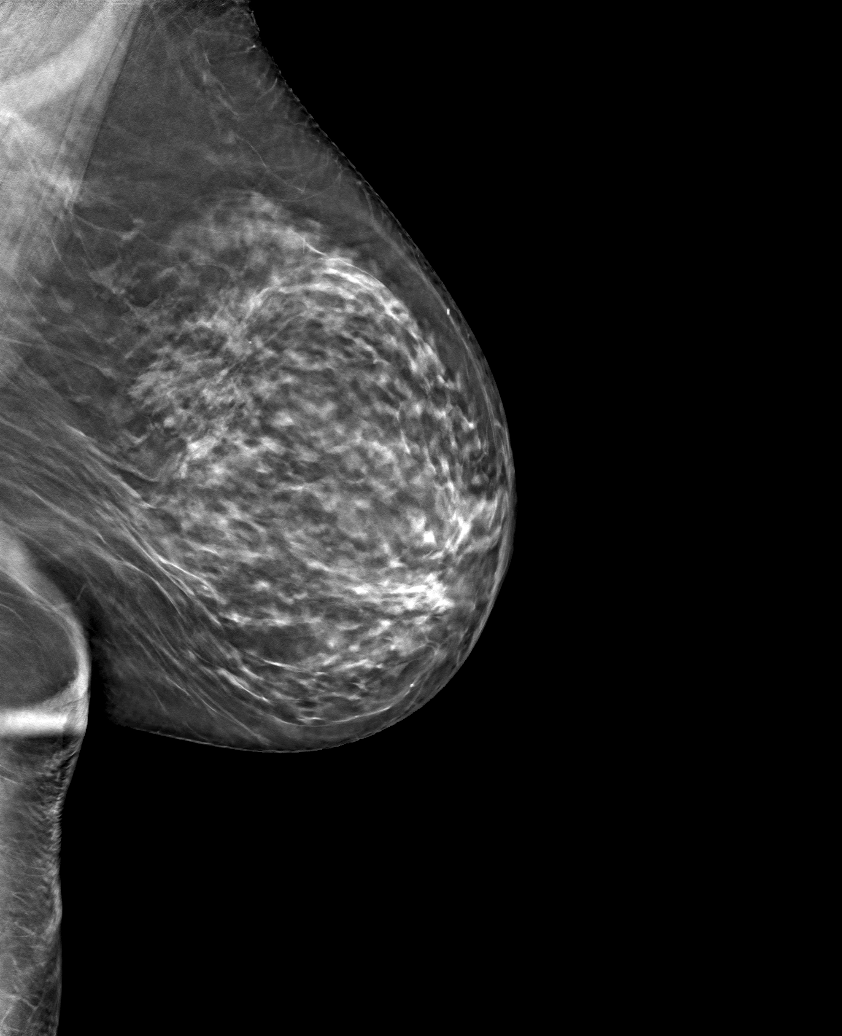

[6 of 30 positions shown; findings below may reference images not displayed]

ACR Breast Density Category c: The breast tissue is heterogeneously
dense, which may obscure small masses.
FINDINGS: There are no findings suspicious for malignancy.
IMPRESSION: No mammographic evidence of malignancy. A result letter of this
screening mammogram will be mailed directly to the patient.

RECOMMENDATION:
Screening mammogram in one year. (Code:Q3-W-BC3)

BI-RADS CATEGORY  1: Negative.

## 2022-06-07 ENCOUNTER — Ambulatory Visit
Admission: RE | Admit: 2022-06-07 | Discharge: 2022-06-07 | Disposition: A | Discharge status: Home / Self Care | Payer: Medicare Other | Source: Ambulatory Visit | Attending: Endocrinology | Admitting: Endocrinology

## 2022-06-07 DIAGNOSIS — N6489 Other specified disorders of breast: Secondary | ICD-10-CM

## 2022-06-10 ENCOUNTER — Encounter: Payer: Self-pay | Admitting: Podiatry

## 2022-06-10 ENCOUNTER — Other Ambulatory Visit: Payer: Self-pay | Admitting: Endocrinology

## 2022-06-10 ENCOUNTER — Ambulatory Visit (INDEPENDENT_AMBULATORY_CARE_PROVIDER_SITE_OTHER): Payer: Medicare Other | Admitting: Podiatry

## 2022-06-10 DIAGNOSIS — N6489 Other specified disorders of breast: Secondary | ICD-10-CM

## 2022-06-10 DIAGNOSIS — L603 Nail dystrophy: Secondary | ICD-10-CM

## 2022-06-10 NOTE — Progress Notes (Signed)
She presents today after having not seen her for 2 years with a chief concern of discoloration of the hallux nails and the second digit bilaterally.  She states that she still has some discomfort from the neuroma sites on the left foot particularly the second and third interdigital spaces.  She states the right was doing just fine.  Objective: Vital signs are stable she is alert and oriented x 3.  Pulses are palpable.  She still has some tenderness on palpation to the second interdigital space primarily the third is most tender with a palpable Mulder's click left.  The hallux nails bilaterally and the second digit on the right foot demonstrate what appears to be a nail dystrophy on not sure that this is a nail fungus at this point.  There is no correlation with cutis changes around the nails.  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  Plan: I injected both of these interspaces today with a total of 10 mg of Kenalog and local anesthetic.  She tolerated procedure well.  Samples of the skin and nail were taken today for pathologic evaluation and I will follow-up with her in 1 month

## 2022-06-15 ENCOUNTER — Emergency Department
Admission: EM | Admit: 2022-06-15 | Discharge: 2022-06-15 | Disposition: A | Discharge status: Home / Self Care | Payer: Medicare Other | Attending: Student in an Organized Health Care Education/Training Program | Admitting: Student in an Organized Health Care Education/Training Program

## 2022-06-15 ENCOUNTER — Emergency Department: Payer: Medicare Other

## 2022-06-15 ENCOUNTER — Other Ambulatory Visit: Payer: Self-pay

## 2022-06-15 DIAGNOSIS — R55 Syncope and collapse: Secondary | ICD-10-CM | POA: Insufficient documentation

## 2022-06-15 DIAGNOSIS — Z8673 Personal history of transient ischemic attack (TIA), and cerebral infarction without residual deficits: Secondary | ICD-10-CM | POA: Diagnosis not present

## 2022-06-15 DIAGNOSIS — E871 Hypo-osmolality and hyponatremia: Secondary | ICD-10-CM | POA: Insufficient documentation

## 2022-06-15 LAB — URINALYSIS, ROUTINE W REFLEX MICROSCOPIC
Bilirubin Urine: NEGATIVE
Glucose, UA: NEGATIVE mg/dL
Hgb urine dipstick: NEGATIVE
Ketones, ur: 15 mg/dL — AB
Leukocytes,Ua: NEGATIVE
Nitrite: NEGATIVE
Protein, ur: NEGATIVE mg/dL
Specific Gravity, Urine: 1.015 (ref 1.005–1.030)
pH: 7 (ref 5.0–8.0)

## 2022-06-15 LAB — BASIC METABOLIC PANEL
Anion gap: 14 (ref 5–15)
BUN: 19 mg/dL (ref 8–23)
CO2: 22 mmol/L (ref 22–32)
Calcium: 9.7 mg/dL (ref 8.9–10.3)
Chloride: 92 mmol/L — ABNORMAL LOW (ref 98–111)
Creatinine, Ser: 0.95 mg/dL (ref 0.44–1.00)
GFR, Estimated: >60 mL/min (ref >60)
Glucose, Bld: 160 mg/dL — ABNORMAL HIGH (ref 70–99)
Potassium: 3 mmol/L — ABNORMAL LOW (ref 3.5–5.1)
Sodium: 128 mmol/L — ABNORMAL LOW (ref 135–145)

## 2022-06-15 LAB — CBC
HCT: 39.5 % (ref 36.0–46.0)
Hemoglobin: 14.5 g/dL (ref 12.0–15.0)
MCH: 32.4 pg (ref 26.0–34.0)
MCHC: 36.7 g/dL — ABNORMAL HIGH (ref 30.0–36.0)
MCV: 88.4 fL (ref 80.0–100.0)
Platelets: 234 10*3/uL (ref 150–400)
RBC: 4.47 MIL/uL (ref 3.87–5.11)
RDW: 12.3 % (ref 11.5–15.5)
WBC: 5.5 10*3/uL (ref 4.0–10.5)
nRBC: 0 % (ref 0.0–0.2)

## 2022-06-15 LAB — TROPONIN I (HIGH SENSITIVITY)
Troponin I (High Sensitivity): 8 ng/L (ref <18)
Troponin I (High Sensitivity): 12 ng/L (ref <18)

## 2022-06-15 MED ORDER — ACETAMINOPHEN 325 MG PO TABS
650.0000 mg | ORAL_TABLET | Freq: Once | ORAL | Status: AC
  Administered 2022-06-15: 650 mg via ORAL
  Filled 2022-06-15: qty 2

## 2022-06-15 MED ORDER — SODIUM CHLORIDE 0.9 % IV BOLUS
500.0000 mL | Freq: Once | INTRAVENOUS | Status: AC
  Administered 2022-06-15: 500 mL via INTRAVENOUS

## 2022-06-15 NOTE — ED Triage Notes (Signed)
Pt reports was reaching to get a bottle of vinegar and the next thing he knew she was on the ground. Pt unsure if she blacked out to what. Her husband reports he found her on the floor. Pt c/o pain to back of head

## 2022-06-15 NOTE — ED Provider Triage Note (Signed)
Emergency Medicine Provider Triage Evaluation Note  Rachel Donovan , a 77 y.o. female  was evaluated in triage.  Pt complains of falling today, not sure what happened. Thinks that she passed out.  Landed on her back, hit her head.  Unwitnessed.    Review of Systems  Positive: "Sore all over" Negative: No CP or SOB  Physical Exam  BP (!) 132/92 (BP Location: Right Arm)   Pulse (!) 106   Temp 97.7 F (36.5 C) (Oral)   Resp 18   Ht 5\' 1"  (1.549 m)   Wt 54.4 kg   SpO2 100%   BMI 22.67 kg/m  Gen:   Awake, no distress  Alert, talkative Resp:  Normal effort Lungs clear MSK:   Moves extremities without difficulty.  No point tenderness cervical spine to palpation, but tender posterior scalp.   Other:    Medical Decision Making  Medically screening exam initiated at 2:50 PM.  Appropriate orders placed.  Rachel Donovan was informed that the remainder of the evaluation will be completed by another provider, this initial triage assessment does not replace that evaluation, and the importance of remaining in the ED until their evaluation is complete.     Tommi Rumps, PA-C 06/15/22 1455

## 2022-06-15 NOTE — ED Provider Notes (Signed)
[facility] Provider Note    Event Date/Time   First MD Initiated Contact with Patient 06/15/22 1742     (approximate)   History   Fall   HPI  Rachel Donovan is a 77 y.o. female   history of anxiety, remote history of CVA, migraines presents to the ER for evaluation of near syncopal episode that occurred while she was walking into the pantry to get some vinegar.  She felt faint and like she was going to fall.  Did fall did not strike her head denies any pain or discomfort at this time but does report having mild headache shortly after the fall.  Spouse heard her cry his name immediately after she hit the ground she did not lose consciousness.  She denies any chest pain or pressure.  Does report poor p.o. intake because she has not had much appetite and the husband is concerned that she has been very stressed and anxious lately.      Physical Exam   Triage Vital Signs: ED Triage Vitals  Enc Vitals Group     BP 06/15/22 1437 (!) 132/92     Pulse Rate 06/15/22 1437 (!) 106     Resp 06/15/22 1437 18     Temp 06/15/22 1437 97.7 F (36.5 C)     Temp Source 06/15/22 1437 Oral     SpO2 06/15/22 1437 100 %     Weight 06/15/22 1437 120 lb (54.4 kg)     Height 06/15/22 1437 5\' 1"  (1.549 m)     Head Circumference --      Peak Flow --      Pain Score 06/15/22 1444 5     Pain Loc --      Pain Edu? --      Excl. in GC? --     Most recent vital signs: Vitals:   06/15/22 2001 06/15/22 2019  BP:  (!) 177/73  Pulse:  86  Resp:  18  Temp: 97.6 F (36.4 C)   SpO2:  98%     Constitutional: Alert  Eyes: Conjunctivae are normal.  Head: Atraumatic. Nose: No congestion/rhinnorhea. Mouth/Throat: Mucous membranes are moist.   Neck: Painless ROM.  Cardiovascular:   Good peripheral circulation. Respiratory: Normal respiratory effort.  No retractions.  Gastrointestinal: Soft and nontender.  Musculoskeletal:  no deformity Neurologic:  MAE spontaneously. No  gross focal neurologic deficits are appreciated.  Skin:  Skin is warm, dry and intact. No rash noted. Psychiatric: Mood and affect are normal. Speech and behavior are normal.    ED Results / Procedures / Treatments   Labs (all labs ordered are listed, but only abnormal results are displayed) Labs Reviewed  BASIC METABOLIC PANEL - Abnormal; Notable for the following components:      Result Value   Sodium 128 (*)    Potassium 3.0 (*)    Chloride 92 (*)    Glucose, Bld 160 (*)    All other components within normal limits  CBC - Abnormal; Notable for the following components:   MCHC 36.7 (*)    All other components within normal limits  URINALYSIS, ROUTINE W REFLEX MICROSCOPIC - Abnormal; Notable for the following components:   Color, Urine YELLOW (*)    APPearance CLEAR (*)    Ketones, ur 15 (*)    Bacteria, UA RARE (*)    All other components within normal limits  TROPONIN I (HIGH SENSITIVITY)  TROPONIN I (HIGH SENSITIVITY)  EKG  ED ECG REPORT I, Willy Eddy, the attending physician, personally viewed and interpreted this ECG.   Date: 06/15/2022  EKG Time: 14:50  Rate: 95  Rhythm: sinus  Axis: normal  Intervals:normal  ST&T Change: nonspecifc st abn, no stemi    RADIOLOGY Please see ED Course for my review and interpretation.  I personally reviewed all radiographic images ordered to evaluate for the above acute complaints and reviewed radiology reports and findings.  These findings were personally discussed with the patient.  Please see medical record for radiology report.    PROCEDURES:  Critical Care performed: No  Procedures   MEDICATIONS ORDERED IN ED: Medications  sodium chloride 0.9 % bolus 500 mL (0 mLs Intravenous Stopped 06/15/22 1916)  acetaminophen (TYLENOL) tablet 650 mg (650 mg Oral Given 06/15/22 1854)     IMPRESSION / MDM / ASSESSMENT AND PLAN / ED COURSE  I reviewed the triage vital signs and the nursing notes.                               Differential diagnosis includes, but is not limited to, duration, electrolyte abnormality, anemia, CVA, ICH, IPH, ACS, dysrhythmia  Patient presenting to the ER for evaluation of symptoms as described above.  Based on symptoms, risk factors and considered above differential, this presenting complaint could reflect a potentially life-threatening illness therefore the patient will be placed on continuous pulse oximetry and telemetry for monitoring.  Laboratory evaluation will be sent to evaluate for the above complaints.  Nontoxic-appearing mild hyponatremia likely secondary to poor p.o. intake will give IV fluids.  CT imaging ordered triage on my review and interpretation does not show evidence of acute finding or SDH.  Will observe for serial enzymes will give fluids.  Have low suspicion for [facility], seizure, ACS or dissection.       FINAL CLINICAL IMPRESSION(S) / ED DIAGNOSES   Final diagnoses:  Near syncope     Rx / DC Orders   ED Discharge Orders     None        Note:  This document was prepared using Dragon voice recognition software and may include unintentional dictation errors.    Willy Eddy, MD 06/15/22 2043

## 2022-07-03 ENCOUNTER — Other Ambulatory Visit: Payer: Self-pay | Admitting: Cardiovascular Disease

## 2022-07-09 ENCOUNTER — Ambulatory Visit: Payer: Medicare Other | Admitting: Podiatry

## 2022-07-11 ENCOUNTER — Ambulatory Visit (INDEPENDENT_AMBULATORY_CARE_PROVIDER_SITE_OTHER): Payer: Medicare Other | Admitting: Podiatry

## 2022-07-11 ENCOUNTER — Encounter: Payer: Self-pay | Admitting: Podiatry

## 2022-07-11 DIAGNOSIS — D2372 Other benign neoplasm of skin of left lower limb, including hip: Secondary | ICD-10-CM | POA: Diagnosis not present

## 2022-07-11 DIAGNOSIS — L603 Nail dystrophy: Secondary | ICD-10-CM

## 2022-07-11 NOTE — Progress Notes (Signed)
She presents today for follow-up of her neuroma to the second interdigital space and third interspace left foot.  States that is still just that 1 little spot right underneath.  She points to a callus plantarly.  But she also presents for her nail pathology report.  Objective: Pulses are palpable.  She has a small porokeratotic lesion beneath the second metatarsal head of the left foot.  I remove that today.  I also discussed her nail results with her today which did demonstrate a saprophytic fungus.  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  Plan: We discussed pros and cons of laser therapy and oral therapy at this point she does not want to do anything for this should it start to spread she will notify us.  I debrided the benign skin lesion for her today.  She will follow-up with Dr. Eloy End

## 2022-08-05 ENCOUNTER — Encounter: Payer: Self-pay | Admitting: Cardiovascular Disease

## 2022-08-05 NOTE — Progress Notes (Unsigned)
Cardiology Office Note:    Date:  08/06/2022   ID:  LORINA DENDY, DOB [birth year], MRN [id number]  PCP:  Adrian Prince, MD   [facility] Providers Cardiologist:  Julien Nordmann, MD     Referring MD: Adrian Prince, MD   Chief Complaint  Patient presents with   Hypertension        Hyperlipidemia     History of Present Illness:    Rachel Donovan is a 77 y.o. female with a hx of history of CVA, hyperlipidemia and coronary artery calcifications.  She is previously been seen by Dr. Mariah Milling in 2020.  Hx of HLD , hypothyroidism   Coronary calcium score of 103. This was 85 percentile for age and sex matched control.  CVA in 2006.  Possibly a TIA  - is on Aggrenox Occasional episodes of CP behind both breasts Gets some exercise Cp only lasts for seconds  Goes to a class on mondays .   Exercise  does not bring on these cp   BP is a bit elevated.  Eats salt Takes furosemide on occasion Has some ankle edema at night .  Typically goes down bu morning   She admits that she eats too much salt Will add HCTZ 25 mg a day   July 23, 2021 Pt is seen for follow up of HTN, TIA, coronary artery calcifications  Is not feeling well.   BP is up  May be having some gall bladder issues  Has not been eating much recently   August 06, 2022 Lynnanne is seen for follow up of her HTN, TIA, Coronary artery calcification BP is elevated Has been a stressful week  No CP or dyspnea.   Has not been taking her hydrochlorothiazide or lasix ( due to hyponatremia )  BP has been a bit elevated.  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   atorvastatin (LIPITOR) 20 MG tablet atorvastatin 20 mg tablet   busPIRone (BUSPAR) 5 MG tablet Take 5 mg by mouth 2 (two) times daily.   Calcium Carbonate-Vit D-Min (CALCIUM 1200 PO) Take by mouth daily.   denosumab (PROLIA) 60 MG/ML SOSY injection  Inject 60 mg into the skin every 6 (six) months.   dipyridamole-aspirin (AGGRENOX) 200-25 MG 12hr capsule Take 1 capsule by mouth 2 (two) times daily.    felodipine (PLENDIL) 10 MG 24 hr tablet Take 10 mg by mouth daily.   Folic Acid-Vit B6-Vit B12 (FABB) 2.2-25-1 MG TABS Take 1 tablet by mouth daily.   levothyroxine (SYNTHROID, LEVOTHROID) 125 MCG tablet Take 125 mcg by mouth every other day.    levothyroxine (SYNTHROID, LEVOTHROID) 137 MCG tablet TK 1 T PO EVERY MORNING BEFORE BREAKFAST ALTERNATING WITH T   losartan (COZAAR) 100 MG tablet losartan 100 mg tablet   metoprolol succinate (TOPROL-XL) 50 MG 24 hr tablet Take 1 tablet (50 mg total) by mouth daily. Need appointment   Multiple Vitamin (GNP ESSENTIAL ONE DAILY) TABS Take by mouth daily.    Current Facility-Administered Medications for the 08/06/22 encounter (Office Visit) with Malita Ignasiak, Deloris Ping, MD  Medication   0.9 %  sodium chloride infusion     Allergies:   Codeine, Penicillins, and Sulfa antibiotics   Social History   Socioeconomic  History   Marital status: Married    Spouse name: Not on file   Number of children: Not on file   Years of education: Not on file   Highest education level: Not on file  Occupational History   Not on file  Tobacco Use   Smoking status: Former    Packs/day: 0.50    Years: 12.00    Additional pack years: 0.00    Total pack years: 6.00    Types: Cigarettes    Quit date: 02/04/1978    Years since quitting: 44.5   Smokeless tobacco: Never  Vaping Use   Vaping Use: Never used  Substance and Sexual Activity   Alcohol use: Yes    Comment: occassional beer with pizza   Drug use: No   Sexual activity: Not Currently    Partners: Male    Birth control/protection: Post-menopausal  Other Topics Concern   Not on file  Social History Narrative   Not on file   Social Determinants of Health   Financial Resource Strain: Not on file  Food Insecurity: Not on file  Transportation Needs: Not on file  Physical Activity: Not on file  Stress: Not on file  Social Connections: Not on file    Family History: The patient's family history includes Arthritis in her brother, father, and mother; Heart attack in her brother; Heart disease in her brother, father, and mother; Hypertension in her brother, father, and mother; Kidney disease in her mother; Stroke in her mother; Thyroid cancer in her son.  ROS:   Please see the history of present illness.     All other systems reviewed and are negative.  EKGs/Labs/Other Studies Reviewed:    The following studies were reviewed today:  EKG:      Recent Labs: 01/07/2022: ALT 17 06/15/2022: BUN 19; Creatinine, Ser 0.95; Hemoglobin 14.5; Platelets 234; Potassium 3.0; Sodium 128  Recent Lipid Panel No results found for: "CHOL", "TRIG", "HDL", "CHOLHDL", "VLDL", "LDLCALC", "LDLDIRECT"   Risk Assessment/Calculations:     Physical Exam:    Physical Exam: Blood pressure (!) 152/70, pulse (!) 58, height 5\' 1"  (1.549 m), weight 113 lb 12.8 oz (51.6 kg),  SpO2 99 %.  HYPERTENSION CONTROL Vitals:   08/06/22 1514 08/06/22 1550  BP: (!) 162/98 (!) 152/70    The patient's blood pressure is elevated above target today.  In order to address the patient's elevated  BP: A new medication was prescribed today.       GEN:  Well nourished, well developed in no acute distress HEENT: Normal NECK: No JVD; No carotid bruits LYMPHATICS: No lymphadenopathy CARDIAC: RRR , no murmurs, rubs, gallops RESPIRATORY:  Clear to auscultation without rales, wheezing or rhonchi  ABDOMEN: Soft, non-tender, non-distended MUSCULOSKELETAL:  No edema; No deformity  SKIN: Warm and dry NEUROLOGIC:  Alert and oriented x 3   ASSESSMENT:    1. Essential hypertension      PLAN:       Hypertension: Blood pressure is mildly elevated.  She is stopped her HCTZ and Lasix because of hyponatremia concerns.  Will try her on amlodipine 5 mg a day.  Continue current dose of losartan.  2.  Hyperlipidemia: Stable.  3.  Coronary artery calcifications: She denies any angina.       Medication Adjustments/Labs and Tests Ordered: Current medicines are reviewed at length with the patient today.  Concerns regarding medicines are outlined above.  No orders of the defined types were placed in this encounter.   Meds ordered this encounter  Medications   amLODipine (NORVASC) 5 MG tablet    Sig: Take 1 tablet (5 mg total) by mouth daily.    Dispense:  90 tablet    Refill:  3     Patient Instructions  Medication Instructions:  STOP Furosemide STOP Hydrochlorothiazide  START Amlodipine 5 mg once daily  *If you need a refill on your cardiac medications before your next appointment, please call your pharmacy*   Follow-Up: At [facility], you and your health needs are our priority.  As part of our continuing mission to provide you with exceptional heart care, we have created designated Provider Care Teams.  These Care Teams include your primary Cardiologist  (physician) and Advanced Practice Providers (APPs -  Physician Assistants and Nurse Practitioners) who all work together to provide you with the care you need, when you need it.  We recommend signing up for the patient portal called "MyChart".  Sign up information is provided on this After Visit Summary.  MyChart is used to connect with patients for Virtual Visits (Telemedicine).  Patients are able to view lab/test results, encounter notes, upcoming appointments, etc.  Non-urgent messages can be sent to your provider as well.   To learn more about what you can do with MyChart, go to [email or web address].    Your next appointment:   3 month(s)  Provider:   Jari Favre, PA-C, Eligha Bridegroom, NP, or Tereso Newcomer, PA-C       Signed, Kristeen Miss, MD  08/06/2022 4:05 PM    [facility]

## 2022-08-06 ENCOUNTER — Ambulatory Visit: Payer: Medicare Other | Attending: Cardiovascular Disease | Admitting: Cardiovascular Disease

## 2022-08-06 ENCOUNTER — Encounter: Payer: Self-pay | Admitting: Cardiovascular Disease

## 2022-08-06 VITALS — BP 152/70 | HR 58 | Ht 61.0 in | Wt 113.8 lb

## 2022-08-06 DIAGNOSIS — I10 Essential (primary) hypertension: Secondary | ICD-10-CM | POA: Diagnosis not present

## 2022-08-06 MED ORDER — AMLODIPINE BESYLATE 5 MG PO TABS: 5.0000 mg | ORAL_TABLET | Freq: Every day | ORAL | 3 refills | Status: DC

## 2022-08-06 NOTE — Patient Instructions (Signed)
Medication Instructions:  STOP Furosemide STOP Hydrochlorothiazide  START Amlodipine 5 mg once daily  *If you need a refill on your cardiac medications before your next appointment, please call your pharmacy*   Follow-Up: At [facility], you and your health needs are our priority.  As part of our continuing mission to provide you with exceptional heart care, we have created designated Provider Care Teams.  These Care Teams include your primary Cardiologist (physician) and Advanced Practice Providers (APPs -  Physician Assistants and Nurse Practitioners) who all work together to provide you with the care you need, when you need it.  We recommend signing up for the patient portal called "MyChart".  Sign up information is provided on this After Visit Summary.  MyChart is used to connect with patients for Virtual Visits (Telemedicine).  Patients are able to view lab/test results, encounter notes, upcoming appointments, etc.  Non-urgent messages can be sent to your provider as well.   To learn more about what you can do with MyChart, go to [email or web address].    Your next appointment:   3 month(s)  Provider:   Jari Favre, PA-C, Eligha Bridegroom, NP, or Tereso Newcomer, PA-C

## 2022-08-15 ENCOUNTER — Encounter: Payer: Self-pay | Admitting: Podiatry

## 2022-08-15 ENCOUNTER — Ambulatory Visit (INDEPENDENT_AMBULATORY_CARE_PROVIDER_SITE_OTHER): Payer: Medicare Other | Admitting: Podiatry

## 2022-08-15 VITALS — BP 149/80 | HR 80

## 2022-08-15 DIAGNOSIS — M79674 Pain in right toe(s): Secondary | ICD-10-CM | POA: Diagnosis not present

## 2022-08-15 DIAGNOSIS — B351 Tinea unguium: Secondary | ICD-10-CM

## 2022-08-15 DIAGNOSIS — Q828 Other specified congenital malformations of skin: Secondary | ICD-10-CM | POA: Diagnosis not present

## 2022-08-15 DIAGNOSIS — M79675 Pain in left toe(s): Secondary | ICD-10-CM | POA: Diagnosis not present

## 2022-08-15 DIAGNOSIS — I739 Peripheral vascular disease, unspecified: Secondary | ICD-10-CM

## 2022-08-15 NOTE — Progress Notes (Signed)
  Subjective:  Patient ID: Rachel Donovan, female    DOB: 1945/08/17,  MRN: [id number]  Rachel Donovan presents to clinic today for: at risk foot care. Patient has h/o PAD and painful porokeratotic lesion(s) left foot and painful mycotic toenails that limit ambulation. Painful toenails interfere with ambulation. Aggravating factors include wearing enclosed shoe gear. Pain is relieved with periodic professional debridement. Painful porokeratotic lesions are aggravated when weightbearing with and without shoegear. Pain is relieved with periodic professional debridement.  Chief Complaint  Patient presents with   Nail Problem    "Cut these toenails, please!"   PCP is [place], Rachel Senior, MD.  Allergies  Allergen Reactions   Codeine Hives   Penicillins Hives   Sulfa Antibiotics Hives    Review of Systems: Negative except as noted in the HPI.  Objective: No changes noted in today's physical examination. Vitals:   08/15/22 1046  BP: (!) 149/80  Pulse: 80   Rachel Donovan is a pleasant 77 y.o. female WD, WN in NAD. AAO x 3.  Vascular Examination: Capillary refill time <3 seconds b/l LE. Palpable pedal pulses b/l LE. Digital hair absent b/l. Skin temperature gradient WNL b/l. No varicosities b/l. Trace edema noted bilateral ankles. No cyanosis or clubbing noted b/l LE.Marland Kitchen  Dermatological Examination: Pedal skin with normal turgor, texture and tone b/l. No open wounds. No interdigital macerations b/l. Toenails b/l great toes and right 2nd toe thickened, discolored, dystrophic with subungual debris. There is pain on palpation to dorsal aspect of nailplates. Porokeratotic lesion(s) submet head 2 left foot. No erythema, no edema, no drainage, no fluctuance.  Neurological Examination: Protective sensation intact with 10 gram monofilament b/l LE. Vibratory sensation intact b/l LE.   Musculoskeletal Examination: Normal muscle strength 5/5 to all lower extremity muscle groups bilaterally. Hammertoe(s) noted  to the L 2nd toe, L 3rd toe, and L 4th toe. No pain, crepitus or joint limitation noted with ROM b/l LE.  Patient ambulates independently without assistive aids. Wearing Oofos sneakers today.  Assessment/Plan: 1. Pain due to onychomycosis of toenails of both feet   2. Porokeratosis   3. PAD (peripheral artery disease) (HCC)    -Consent given for treatment as described below: -Examined patient. -Patient to continue soft, supportive shoe gear daily. -Dystrophic nails debrided x 3 debrided in length and girth without incident. Remaining toenails trimmed. -Pinpoint bleeding sustained during debridement of porokeratosis. Treated with Lumicain Hemostatic Solution, alcohol and triple antibiotic ointment. Patient instructed to apply Neosporin to left foot once daily for 7 days. Call office if she has any problems. -Patient/POA to call should there be question/concern in the interim.   Return in about 9 weeks (around 10/17/2022).  Rachel Donovan, DPM

## 2022-10-04 ENCOUNTER — Ambulatory Visit (INDEPENDENT_AMBULATORY_CARE_PROVIDER_SITE_OTHER): Payer: Medicare Other | Admitting: Nurse Practitioner

## 2022-10-04 ENCOUNTER — Encounter: Payer: Self-pay | Admitting: Nurse Practitioner

## 2022-10-04 ENCOUNTER — Ambulatory Visit (INDEPENDENT_AMBULATORY_CARE_PROVIDER_SITE_OTHER)
Admission: RE | Admit: 2022-10-04 | Discharge: 2022-10-04 | Disposition: A | Discharge status: Home / Self Care | Payer: Medicare Other | Source: Ambulatory Visit | Attending: Nurse Practitioner | Admitting: Nurse Practitioner

## 2022-10-04 VITALS — BP 110/62 | HR 68 | Temp 97.5°F | Ht 62.0 in | Wt 111.4 lb

## 2022-10-04 DIAGNOSIS — Z8673 Personal history of transient ischemic attack (TIA), and cerebral infarction without residual deficits: Secondary | ICD-10-CM

## 2022-10-04 DIAGNOSIS — R413 Other amnesia: Secondary | ICD-10-CM

## 2022-10-04 DIAGNOSIS — M899 Disorder of bone, unspecified: Secondary | ICD-10-CM

## 2022-10-04 DIAGNOSIS — Z87891 Personal history of nicotine dependence: Secondary | ICD-10-CM

## 2022-10-04 DIAGNOSIS — E890 Postprocedural hypothyroidism: Secondary | ICD-10-CM

## 2022-10-04 DIAGNOSIS — R634 Abnormal weight loss: Secondary | ICD-10-CM

## 2022-10-04 DIAGNOSIS — I739 Peripheral vascular disease, unspecified: Secondary | ICD-10-CM | POA: Diagnosis not present

## 2022-10-04 DIAGNOSIS — I10 Essential (primary) hypertension: Secondary | ICD-10-CM

## 2022-10-04 LAB — COMPREHENSIVE METABOLIC PANEL
ALT: 20 U/L (ref 0–35)
AST: 18 U/L (ref 0–37)
Albumin: 4.6 g/dL (ref 3.5–5.2)
Alkaline Phosphatase: 80 U/L (ref 39–117)
BUN: 25 mg/dL — ABNORMAL HIGH (ref 6–23)
CO2: 27 mEq/L (ref 19–32)
Calcium: 9.3 mg/dL (ref 8.4–10.5)
Chloride: 95 mEq/L — ABNORMAL LOW (ref 96–112)
Creatinine, Ser: 0.83 mg/dL (ref 0.40–1.20)
GFR: 67.93 mL/min (ref >60.00)
Glucose, Bld: 95 mg/dL (ref 70–99)
Potassium: 4.2 mEq/L (ref 3.5–5.1)
Sodium: 130 mEq/L — ABNORMAL LOW (ref 135–145)
Total Bilirubin: 0.6 mg/dL (ref 0.2–1.2)
Total Protein: 6.7 g/dL (ref 6.0–8.3)

## 2022-10-04 LAB — CBC
HCT: 42.1 % (ref 36.0–46.0)
Hemoglobin: 14.1 g/dL (ref 12.0–15.0)
MCHC: 33.5 g/dL (ref 30.0–36.0)
MCV: 97.1 fl (ref 78.0–100.0)
Platelets: 225 10*3/uL (ref 150.0–400.0)
RBC: 4.34 Mil/uL (ref 3.87–5.11)
RDW: 13.8 % (ref 11.5–15.5)
WBC: 5.1 10*3/uL (ref 4.0–10.5)

## 2022-10-04 LAB — LIPID PANEL
Cholesterol: 153 mg/dL (ref 0–200)
HDL: 71.6 mg/dL (ref >39.00)
LDL Cholesterol: 66 mg/dL (ref 0–99)
NonHDL: 81.46
Total CHOL/HDL Ratio: 2
Triglycerides: 79 mg/dL (ref 0.0–149.0)
VLDL: 15.8 mg/dL (ref 0.0–40.0)

## 2022-10-04 LAB — URINALYSIS, MICROSCOPIC ONLY

## 2022-10-04 LAB — VITAMIN B12: Vitamin B-12: 1182 pg/mL — ABNORMAL HIGH (ref 211–911)

## 2022-10-04 LAB — TSH: TSH: 0.5 u[IU]/mL (ref 0.35–5.50)

## 2022-10-04 NOTE — Progress Notes (Signed)
New Patient Office Visit  Subjective    Patient ID: Rachel Donovan, female    DOB: 1945/07/23  Age: 77 y.o. MRN: [id number]  CC:  Chief Complaint  Patient presents with   Establish Care    Pt complains of having memory problems.  Pt also needs DPR set up.    Weight Loss    Pt would like to discuss her unintentional weight loss.     HPI Rachel Donovan presents to establish care   HTN: on amlodipine, metoprolol, losartan. States does not check at home. Dr End once a year   HLD: currently on atorvastatin.  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 States that it was approx 5 months ago.  DEXA scan up to date patient was followed by Dr. Arloa Koh  Mammogram: utd  Dexa: up to date   Memory: states that she has noticed it personally and with the family. States that she remebers families names. States that she is also ood with directions. States sometimes she will have repeat converstations and will forget if she has taken her morning meds States that she will go to bed around 9-11 and will get up aroun 7. Occassional snores and will sleep walk sometimes.   Weight loss: has been over the past 3 moths. States that she has lost 10-15 pounds. Statse that she is eating 2 meals a day.states that she will have some fruit  Colonoscpy: 2022     10/04/2022   12:55 PM  MMSE - Mini Mental State Exam  Orientation to time 2  Orientation to Place 4  Registration 3  Attention/ Calculation 5  Recall 2  Language- name 2 objects 2  Language- repeat 1  Language- follow 3 step command 3  Language- read & follow direction 1  Write a sentence 1  Copy design 1  Total score 25     Outpatient Encounter Medications as of 10/04/2022  Medication Sig   amLODipine (NORVASC) 5 MG tablet  Take 1 tablet (5 mg total) by mouth daily.   atorvastatin (LIPITOR) 20 MG tablet atorvastatin 20 mg tablet   buPROPion (WELLBUTRIN XL) 150 MG 24 hr tablet 1 tablet in the morning Orally Once a day for 90 days   busPIRone (BUSPAR) 5 MG tablet Take 5 mg by mouth 2 (two) times daily.   Calcium Carbonate-Vit D-Min (CALCIUM 1200 PO) Take by mouth daily.   dipyridamole-aspirin (AGGRENOX) 200-25 MG 12hr capsule Take 1 capsule by mouth 2 (two) times daily.    felodipine (PLENDIL) 10 MG 24 hr tablet Take 10 mg by mouth daily.   Folic Acid-Vit B6-Vit B12 (FABB) 2.2-25-1 MG TABS Take 1 tablet by mouth daily.   levothyroxine (SYNTHROID, LEVOTHROID) 125 MCG tablet Take 125 mcg by mouth every other day.    levothyroxine (SYNTHROID, LEVOTHROID) 137 MCG tablet TK 1 T PO EVERY MORNING BEFORE BREAKFAST ALTERNATING WITH T   losartan (COZAAR) 100 MG tablet losartan 100 mg tablet   metoprolol succinate (TOPROL-XL) 50 MG 24 hr tablet Take 1 tablet (50 mg total) by mouth daily. Need appointment   Multiple Vitamin (GNP ESSENTIAL ONE DAILY) TABS Take by mouth daily.    denosumab (PROLIA) 60 MG/ML SOSY injection Inject 60 mg into the skin every 6 (six) months. 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    OA (osteoarthritis)    PVD (peripheral vascular disease) (HCC)    Rectocele    Shingles     Past Surgical History:  Procedure Laterality Date    ABDOMINAL HYSTERECTOMY     BREAST BIOPSY Right    X 2   BREAST BIOPSY Left    BREAST SURGERY     BX breast lumps X 3   CATARACT EXTRACTION, BILATERAL     COLONOSCOPY     PARTIAL HYSTERECTOMY     REPLACEMENT TOTAL KNEE Bilateral    RIGHT WRIST TENDON     THYROIDECTOMY      Family History  Problem Relation Age of Onset   Stroke Mother    Hypertension Mother    Heart disease Mother    Arthritis Mother    Kidney disease Mother    Hypertension Father    Heart disease Father    Arthritis Father    Hypertension Brother    Heart attack Brother    Heart disease Brother    Arthritis Brother    Thyroid cancer Son     Social History   Socioeconomic History   Marital status: Married    Spouse name: Nadine Counts   Number of children: 4   Years of education: Not on file   Highest education level: Not on file  Occupational History   Not on file  Tobacco Use   Smoking status: Former    Current packs/day: 0.00    Average packs/day: 0.5 packs/day for 12.0 years (6.0 ttl pk-yrs)    Types: Cigarettes    Start date: 02/04/1966    Quit date: 02/04/1978    Years since quitting: 44.6   Smokeless tobacco: Never  Vaping Use   Vaping status: Never Used  Substance and Sexual Activity   Alcohol use: Yes    Comment: occassional beer with pizza, wine sometime   Drug use: No   Sexual activity: Not Currently    Partners: Male    Birth control/protection: Post-menopausal  Other Topics Concern   Not on file  Social History Narrative      1 bio and 3 bonus    Social Determinants of Health   Financial Resource Strain: Not on file  Food Insecurity: Not on file  Transportation Needs: Not on file  Physical Activity: Not on file  Stress: Not on file  Social Connections: Not on file  Intimate Partner Violence: Not on file    Review of Systems  Constitutional:  Positive for weight loss. Negative for chills and fever.  Respiratory:  Negative for shortness of breath.   Cardiovascular:  Negative  for chest pain.  Neurological:  Negative for headaches.  Psychiatric/Behavioral:  Positive for memory loss. Negative for hallucinations and suicidal ideas. The patient does not have insomnia.  Objective    BP 110/62   Pulse 68   Temp (!) 97.5 F (36.4 C) (Temporal)   Ht 5\' 2"  (1.575 m)   Wt 111 lb 6.4 oz (50.5 kg)   SpO2 96%   BMI 20.38 kg/m   Physical Exam Vitals and nursing note reviewed.  Constitutional:      Appearance: Normal appearance.  HENT:     Right Ear: Tympanic membrane, ear canal and external ear normal.     Left Ear: Tympanic membrane, ear canal and external ear normal.     Mouth/Throat:     Mouth: Mucous membranes are moist.     Pharynx: Oropharynx is clear.  Eyes:     Extraocular Movements: Extraocular movements intact.     Pupils: Pupils are equal, round, and reactive to light.  Cardiovascular:     Rate and Rhythm: Normal rate and regular rhythm.     Pulses: Normal pulses.     Heart sounds: Normal heart sounds.  Pulmonary:     Effort: Pulmonary effort is normal.     Breath sounds: Normal breath sounds.  Musculoskeletal:     Right lower leg: No edema.     Left lower leg: No edema.  Lymphadenopathy:     Cervical: No cervical adenopathy.  Skin:    General: Skin is warm.  Neurological:     General: No focal deficit present.     Mental Status: She is alert.     Deep Tendon Reflexes:     Reflex Scores:      Bicep reflexes are 2+ on the right side and 2+ on the left side.      Patellar reflexes are 2+ on the right side and 2+ on the left side.    Comments: Bilateral upper and lower extremity strength 5/5  Psychiatric:        Mood and Affect: Mood normal.        Behavior: Behavior normal.        Thought Content: Thought content normal.        Judgment: Judgment normal.         Assessment & Plan:   Problem List Items Addressed This Visit       Cardiovascular and Mediastinum   Essential hypertension - Primary    Patient sees  cardiology once a year.  Patient is on amlodipine, metoprolol, losartan.  Continue medication as prescribed patient tolerates medication well blood pressure controlled      Relevant Orders   CBC   Comprehensive metabolic panel   TSH   Lipid panel   Peripheral vascular disease (HCC)    Patient currently on statin therapy pending lipid panel      Relevant Orders   Lipid panel     Endocrine   Postoperative hypothyroidism    Patient levothyroxine 125 mcg and 137 mcg.  Every other day dosing continue pending TSH today      Relevant Orders   TSH     Musculoskeletal and Integument   Disorder of bone    Patient's DEXA scan up-to-date.  Has had 1 dose of her Prolia.  Continue medication.  Patient thinks Prolia may be part of her new issues with memory impairment      Relevant Orders   TSH     Other   History of CVA (cerebrovascular accident)    Patient on statin therapy.  No deficits patient on Aggrenox also      Memory impairment    MMSE performed in  office.  Patient may 25 out of 30.  Does have a history of stroke we will check basic labs inclusive of TSH and B12.  If everything is normal consider MRI of the brain.  After that juncture neurology or memory medication such as donepezil.  Will also check syphilis today      Relevant Orders   Vitamin B12   TSH   RPR   Former smoker    Urine microscopy to rule out microscopic hematuria      Relevant Orders   DG Chest 2 View   Urine Microscopic   Unintentional weight loss    Will check basic labs inclusive of thyroid since she is on 2 different thyroid medications after a total thyroidectomy.  History of smoking check chest x-ray and urine microscopy.      Relevant Orders   TSH   DG Chest 2 View    Return in about 4 weeks (around 11/01/2022) for memory and weight recheck .   Audria Nine, NP

## 2022-10-04 NOTE — Assessment & Plan Note (Signed)
Patient on statin therapy.  No deficits patient on Aggrenox also

## 2022-10-04 NOTE — Assessment & Plan Note (Signed)
Patient's DEXA scan up-to-date.  Has had 1 dose of her Prolia.  Continue medication.  Patient thinks Prolia may be part of her new issues with memory impairment

## 2022-10-04 NOTE — Assessment & Plan Note (Signed)
MMSE performed in office.  Patient may 25 out of 30.  Does have a history of stroke we will check basic labs inclusive of TSH and B12.  If everything is normal consider MRI of the brain.  After that juncture neurology or memory medication such as donepezil.  Will also check syphilis today

## 2022-10-04 NOTE — Assessment & Plan Note (Signed)
 Urine microscopy to rule out microscopic hematuria

## 2022-10-04 NOTE — Assessment & Plan Note (Signed)
Patient levothyroxine 125 mcg and 137 mcg.  Every other day dosing continue pending TSH today

## 2022-10-04 NOTE — Patient Instructions (Signed)
Nice to see you today I will be in touch with the labs once I have reviewed them and the xray Follow up with me in 1 month for a weight recheck

## 2022-10-04 NOTE — Assessment & Plan Note (Signed)
Will check basic labs inclusive of thyroid since she is on 2 different thyroid medications after a total thyroidectomy.  History of smoking check chest x-ray and urine microscopy.

## 2022-10-04 NOTE — Assessment & Plan Note (Signed)
Patient currently on statin therapy pending lipid panel

## 2022-10-04 NOTE — Assessment & Plan Note (Signed)
Patient sees cardiology once a year.  Patient is on amlodipine, metoprolol, losartan.  Continue medication as prescribed patient tolerates medication well blood pressure controlled

## 2022-10-05 LAB — RPR: RPR Ser Ql: NONREACTIVE

## 2022-10-11 ENCOUNTER — Telehealth: Payer: Self-pay | Admitting: Nurse Practitioner

## 2022-10-11 DIAGNOSIS — I739 Peripheral vascular disease, unspecified: Secondary | ICD-10-CM

## 2022-10-11 DIAGNOSIS — R413 Other amnesia: Secondary | ICD-10-CM

## 2022-10-11 DIAGNOSIS — I10 Essential (primary) hypertension: Secondary | ICD-10-CM

## 2022-10-11 DIAGNOSIS — Z8673 Personal history of transient ischemic attack (TIA), and cerebral infarction without residual deficits: Secondary | ICD-10-CM

## 2022-10-11 NOTE — Addendum Note (Signed)
Addended by: Eden Emms on: 10/11/2022 01:47 PM   Modules accepted: Orders

## 2022-10-11 NOTE — Telephone Encounter (Signed)
Pt's husband, Molly Maduro, came by office asking if there was a pharmacist that could give the pt help with med management. Molly Maduro states the pt has memory issues & management would be very helpful for them. Call back #  [phone]

## 2022-10-11 NOTE — Telephone Encounter (Signed)
Patient returned call,I relayed message to her about the MRI and the medication,she said that she don't think that it will work but she will try the lorazepam,she said that she may need something heavier.

## 2022-10-11 NOTE — Telephone Encounter (Signed)
With the patients history I think it best to do a MRI. She has a prescription of the lorazepam at home. She can take half one before the procedure nd then the other half before it starts

## 2022-10-11 NOTE — Telephone Encounter (Signed)
Left voicemail for patient to call the office back.   

## 2022-10-11 NOTE — Telephone Encounter (Signed)
Patient husband called in and stated that they would like to go ahead with the imaging of her head (CT preferably as she is claustrophobic). He would prefer somewhere in [place] to have this done. He stated that best number to contact is [phone]. Thank you!

## 2022-10-14 ENCOUNTER — Encounter: Payer: Self-pay | Admitting: *Deleted

## 2022-10-14 ENCOUNTER — Telehealth: Payer: Self-pay

## 2022-10-14 NOTE — Telephone Encounter (Signed)
See other encounter notes. Under Result notes.

## 2022-10-15 ENCOUNTER — Telehealth: Payer: Self-pay | Admitting: Nurse Practitioner

## 2022-10-15 NOTE — Telephone Encounter (Signed)
Guidance to help the sodium and electrolytes was given in the mychart message attached to the labs

## 2022-10-15 NOTE — Telephone Encounter (Signed)
-----   Message from [facility] T sent at 10/14/2022  4:14 PM EDT ----- Called patient reviewed all information and repeated back to me. Pt states that she does not see a specialist for her sodium and was hoping that her PCP could help with her low sodium levels. Pt stated that MRI office called her and she did return their call. No questions or concerns.

## 2022-10-16 ENCOUNTER — Ambulatory Visit
Admission: RE | Admit: 2022-10-16 | Discharge: 2022-10-16 | Disposition: A | Discharge status: Home / Self Care | Payer: Medicare Other | Source: Ambulatory Visit | Attending: Nurse Practitioner | Admitting: Nurse Practitioner

## 2022-10-16 ENCOUNTER — Telehealth: Payer: Self-pay

## 2022-10-16 DIAGNOSIS — R413 Other amnesia: Secondary | ICD-10-CM | POA: Insufficient documentation

## 2022-10-16 DIAGNOSIS — Z8673 Personal history of transient ischemic attack (TIA), and cerebral infarction without residual deficits: Secondary | ICD-10-CM | POA: Diagnosis present

## 2022-10-16 MED ORDER — GADOBUTROL 1 MMOL/ML IV SOLN
5.0000 mL | Freq: Once | INTRAVENOUS | Status: AC | PRN
  Administered 2022-10-16: 5 mL via INTRAVENOUS

## 2022-10-16 NOTE — Progress Notes (Unsigned)
   Care Guide Note  10/16/2022 Name: DEZZIE AGRAMONTE MRN: [id number] DOB: 1945-09-01  Referred by: Eden Emms, NP Reason for referral : Care Coordination (Outreach to schedule with Pharm d )   HELEEN WEINBERG is a 77 y.o. year old female who is a primary care patient of Eden Emms, NP. Shirley Muscat Kienitz was referred to the pharmacist for assistance related to HTN and HLD.    An unsuccessful telephone outreach was attempted today to contact the patient who was referred to the pharmacy team for assistance with medication management. Additional attempts will be made to contact the patient.   Penne Lash, RMA Care Guide [facility]  [place], [place] [address] Direct Dial: [phone] [email or web address]

## 2022-10-17 ENCOUNTER — Other Ambulatory Visit: Payer: Medicare Other

## 2022-10-17 NOTE — Telephone Encounter (Signed)
Called patient reviewed all information and repeated back to me. Will call if any questions.  She did get information and has made changes per directions.  No further action needed at this time.

## 2022-10-17 NOTE — Progress Notes (Signed)
   Care Guide Note  10/17/2022 Name: Rachel Donovan MRN: [id number] DOB: [birth year]  Referred by: Eden Emms, NP Reason for referral : Care Coordination (Outreach to schedule with Pharm d )   Rachel Donovan is a 77 y.o. year old female who is a primary care patient of Eden Emms, NP. Rachel Donovan was referred to the pharmacist for assistance related to HTN and HLD.    Successful contact was made with the patient to discuss pharmacy services including being ready for the pharmacist to call at least 5 minutes before the scheduled appointment time, to have medication bottles and any blood sugar or blood pressure readings ready for review. The patient agreed to meet with the pharmacist via with the pharmacist via telephone visit on (date/time).    Penne Lash, RMA Care Guide [facility]  [place], [place] [address] Direct Dial: [phone] [email or web address]

## 2022-10-18 ENCOUNTER — Telehealth: Payer: Self-pay | Admitting: Nurse Practitioner

## 2022-10-18 NOTE — Telephone Encounter (Signed)
Noted will recheck at next office visit

## 2022-10-18 NOTE — Telephone Encounter (Signed)
-----   Message from [facility] T sent at 10/18/2022  2:54 PM EDT -----  ----- Message ----- From: Eden Emms, NP Sent: 10/07/2022   2:20 PM EDT To: Birdena Jubilee  Notified via My Chart   See below

## 2022-10-23 ENCOUNTER — Ambulatory Visit (INDEPENDENT_AMBULATORY_CARE_PROVIDER_SITE_OTHER): Payer: Medicare Other

## 2022-10-23 DIAGNOSIS — Z23 Encounter for immunization: Secondary | ICD-10-CM

## 2022-10-25 ENCOUNTER — Other Ambulatory Visit: Payer: Medicare Other

## 2022-10-27 NOTE — Progress Notes (Unsigned)
Cardiology Office Note:  .   Date:  10/28/2022  ID:  Rachel Donovan, DOB [birth year], MRN [id number] PCP: Eden Emms, NP  [facility] Providers Cardiologist:  Rachel Nordmann, MD    Patient Profile: .      PMH Hypertension Hypothyroidism Prior tobacco abuse Anxiety CVA of unclear etiology in 2006 Elevated coronary calcium score 103 (66th percentile) 01/2019  Previously seen by Dr. Mariah Donovan, she has more recently been followed by Dr. Elease Donovan.  She has been followed for hypertension.  She attributes higher BP to anxiety.  She was told she had an irregular heart rhythm by her PCP.  In 20 showed SR with PACs.  Possible TIA/CVA in 2006, she was prescribed Aggrenox.  Has had difficult to control BP.    Last cardiology clinic visit was 08/06/2022 with Dr. Elease Donovan.She had stopped her HCTZ and Lasix because of concerns with hyponatremia. She was advised to start amlodipine,5 mg daily in addition to losartan.        History of Present Illness: .   Rachel Donovan is a very pleasant 77 y.o. female who is here today for 3 month follow-up of hypertension. She is here today with her daughter-in-law, Rachel Donovan.  Patient reports she is no longer driving due to memory concerns. Larey Seat a few months ago while in her pantry, felt like she got dizzy but did not lose consciousness.  No further episodes of presyncope or syncope, although she describes some episodes of dizziness.  She has not been walking for exercise recently.  Has not been monitoring home BP because monitor has not been working. Her family will work on getting one. She is going to the beach for about 1 month and will have various family members in and out visiting. She denies chest pain, shortness of breath, lower extremity edema, fatigue, palpitations, orthopnea, and PND.   ROS: See HPI       Studies Reviewed: .        Risk Assessment/Calculations:             Physical Exam:   VS:  BP 130/72   Pulse 75   Ht 5\' 1"  (1.549 m)   Wt  113 lb 9.6 oz (51.5 kg)   SpO2 95%   BMI 21.46 kg/m    Wt Readings from Last 3 Encounters:  10/28/22 113 lb 9.6 oz (51.5 kg)  10/04/22 111 lb 6.4 oz (50.5 kg)  08/06/22 113 lb 12.8 oz (51.6 kg)    GEN: Well nourished, well developed in no acute distress NECK: No JVD; No carotid bruits CARDIAC: RRR, no murmurs, rubs, gallops RESPIRATORY:  Clear to auscultation without rales, wheezing or rhonchi  ABDOMEN: Soft, non-tender, non-distended EXTREMITIES:  No edema; No deformity     ASSESSMENT AND PLAN: .    Dizziness: She is having episodes of dizziness and memory impairment. As noted below, we will hold atorvastatin to evaluate for improvement in symptoms.  History of stroke or TIA many years ago.  We will get carotid ultrasound to rule out stenosis that may be contributing.  No episodes of hypotension to her awareness.  CAD without angina: CT calcium score of 103 (66th percentile) on 01/26/2019. She denies chest pain, dyspnea, or other symptoms concerning for angina.  No indication for further ischemic evaluation at this time.  Will follow-up following lipid holiday.  If no improvement in symptoms of memory loss and dizziness while off atorvastatin, her family would prefer for her to resume rosuvastatin since  it is water-soluble.  She has been on Aggrenox for many years.  No bleeding concerns.  Continue amlodipine, metoprolol, losartan.  Hypertension: BP is stable in clinic. She does not have home readings to review, her monitor is no longer working. Encouraged her to monitor BP routinely and report consistent BP > 120/80.  Renal function stable on lab work completed 10/04/2022.  No medication changes today.  Hyperlipidemia: LDL 66 on 10/04/22. Her family suggests holding atorvastatin to see if her memory improves.  She will hold atorvastatin for 2 months.  If no improvement in symptoms, she will start rosuvastatin 20 mg daily. Consider repeat lipid testing at next office visit.        Dispo: 3  months with me  Signed, Eligha Bridegroom, NP-C

## 2022-10-28 ENCOUNTER — Ambulatory Visit (INDEPENDENT_AMBULATORY_CARE_PROVIDER_SITE_OTHER): Payer: Medicare Other | Admitting: Podiatry

## 2022-10-28 ENCOUNTER — Encounter: Payer: Self-pay | Admitting: Podiatry

## 2022-10-28 ENCOUNTER — Ambulatory Visit: Payer: Medicare Other | Attending: Nurse Practitioner | Admitting: Nurse Practitioner

## 2022-10-28 ENCOUNTER — Encounter: Payer: Self-pay | Admitting: Nurse Practitioner

## 2022-10-28 VITALS — BP 130/72 | HR 75 | Ht 61.0 in | Wt 113.6 lb

## 2022-10-28 VITALS — BP 145/76 | HR 71

## 2022-10-28 DIAGNOSIS — Q828 Other specified congenital malformations of skin: Secondary | ICD-10-CM

## 2022-10-28 DIAGNOSIS — I2510 Atherosclerotic heart disease of native coronary artery without angina pectoris: Secondary | ICD-10-CM | POA: Diagnosis not present

## 2022-10-28 DIAGNOSIS — G609 Hereditary and idiopathic neuropathy, unspecified: Secondary | ICD-10-CM | POA: Diagnosis not present

## 2022-10-28 DIAGNOSIS — B351 Tinea unguium: Secondary | ICD-10-CM | POA: Diagnosis not present

## 2022-10-28 DIAGNOSIS — I2584 Coronary atherosclerosis due to calcified coronary lesion: Secondary | ICD-10-CM | POA: Diagnosis present

## 2022-10-28 DIAGNOSIS — M79675 Pain in left toe(s): Secondary | ICD-10-CM | POA: Diagnosis not present

## 2022-10-28 DIAGNOSIS — I739 Peripheral vascular disease, unspecified: Secondary | ICD-10-CM

## 2022-10-28 DIAGNOSIS — I10 Essential (primary) hypertension: Secondary | ICD-10-CM | POA: Diagnosis not present

## 2022-10-28 DIAGNOSIS — E782 Mixed hyperlipidemia: Secondary | ICD-10-CM | POA: Diagnosis not present

## 2022-10-28 DIAGNOSIS — R42 Dizziness and giddiness: Secondary | ICD-10-CM | POA: Diagnosis not present

## 2022-10-28 DIAGNOSIS — M79674 Pain in right toe(s): Secondary | ICD-10-CM | POA: Diagnosis not present

## 2022-10-28 MED ORDER — ROSUVASTATIN CALCIUM 20 MG PO TABS: 20.0000 mg | ORAL_TABLET | Freq: Every day | ORAL | 3 refills | Status: DC

## 2022-10-28 NOTE — Patient Instructions (Signed)
Medication Instructions:   DISCONTINUE Atorvastatin for 2 months.  If memory does improve start Rosuvastatin one (1) tablet by mouth ( 20 mg) daily.  Paper Script give today.   *If you need a refill on your cardiac medications before your next appointment, please call your pharmacy*   Lab Work:  None ordered.  If you have labs (blood work) drawn today and your tests are completely normal, you will receive your results only by: MyChart Message (if you have MyChart) OR A paper copy in the mail If you have any lab test that is abnormal or we need to change your treatment, we will call you to review the results.   Testing/Procedures:  Your physician has requested that you have a carotid duplex. This test is an ultrasound of the carotid arteries in your neck. It looks at blood flow through these arteries that supply the brain with blood. Allow one hour for this exam. There are no restrictions or special instructions.    Follow-Up: At [facility], you and your health needs are our priority.  As part of our continuing mission to provide you with exceptional heart care, we have created designated Provider Care Teams.  These Care Teams include your primary Cardiologist (physician) and Advanced Practice Providers (APPs -  Physician Assistants and Nurse Practitioners) who all work together to provide you with the care you need, when you need it.  We recommend signing up for the patient portal called "MyChart".  Sign up information is provided on this After Visit Summary.  MyChart is used to connect with patients for Virtual Visits (Telemedicine).  Patients are able to view lab/test results, encounter notes, upcoming appointments, etc.  Non-urgent messages can be sent to your provider as well.   To learn more about what you can do with MyChart, go to [email or web address].    Your next appointment:   3 month(s)  Provider:   Eligha Bridegroom, NP

## 2022-10-29 ENCOUNTER — Encounter: Payer: Self-pay | Admitting: Podiatry

## 2022-10-29 NOTE — Progress Notes (Signed)
Subjective:  Patient ID: Rachel Donovan, female    DOB: [birth year],  MRN: [id number]  Rachel Donovan presents to clinic today for: at risk foot care. Patient has h/o PAD and neuropathy and painful porokeratotic lesion(s) left lower extremity and painful mycotic toenails that limit ambulation. Painful toenails interfere with ambulation. Aggravating factors include wearing enclosed shoe gear. Pain is relieved with periodic professional debridement. Painful porokeratotic lesions are aggravated when weightbearing with and without shoegear. Pain is relieved with periodic professional debridement. She is accompanied by her husband on today's visit. Chief Complaint  Patient presents with   Nail Problem    "Trim my toenails."   Callouses    "I have calluses on both feet that need to be trimmed."    PCP is Eden Emms, NP.  Allergies  Allergen Reactions   Codeine Hives   Penicillins Hives   Sulfa Antibiotics Hives   Review of Systems: Negative except as noted in the HPI.  Objective: No changes noted in today's physical examination. Vitals:   10/28/22 1427  BP: (!) 145/76  Pulse: 71    Rachel Donovan is a pleasant 78 y.o. female, WD, WN in NAD. AAO x 3.  Vascular Examination: Capillary refill time <3 seconds b/l LE. Palpable pedal pulses b/l LE. Digital hair present b/l. Skin temperature gradient WNL b/l. No varicosities b/l. Trace edema noted left ankle and right ankle..  Dermatological Examination: Pedal skin is warm and supple b/l LE. No open wounds b/l LE. No interdigital macerations noted b/l LE. Toenails left great toe, right great toe, and R 2nd toe well maintained with adequate length. No erythema, no edema, no drainage, no fluctuance. Porokeratotic lesion(s) submet head 2 left foot. No erythema, no edema, no drainage, no fluctuance..  Neurological Examination: Pt has subjective symptoms of neuropathy. Protective sensation intact with 10 gram monofilament b/l LE. Vibratory  sensation intact b/l LE.   Musculoskeletal Examination: Muscle strength 5/5 to all lower extremity muscle groups bilaterally. Hammertoe(s) noted to the left second digit, left third digit, and left fourth digit.  Assessment/Plan: 1. Pain due to onychomycosis of toenails of both feet   2. Porokeratosis   3. PAD (peripheral artery disease) (HCC)   4. Idiopathic peripheral neuropathy    -Patient was evaluated and treated. All patient's and/or POA's questions/concerns answered on today's visit. -Patient to continue soft, supportive shoe gear daily. -Toenails bilateral great toes, R 2nd toe, and submet head 2 left foot debrided in length and girth without iatrogenic bleeding with sterile nail nipper and dremel.  -Porokeratotic lesion(s) submet head 2 left foot pared and enucleated with sterile currette without incident. Total number of lesions debrided=1. -Patient/POA to call should there be question/concern in the interim.   Return in about 9 weeks (around 12/30/2022).  Freddie Breech, DPM

## 2022-10-30 ENCOUNTER — Encounter: Payer: Self-pay | Admitting: Pharmacist

## 2022-10-30 ENCOUNTER — Other Ambulatory Visit: Payer: Medicare Other | Admitting: Pharmacist

## 2022-10-30 NOTE — Patient Instructions (Addendum)
[name],   It was a pleasure to see you today! We discussed your medications today and updated your medication list.   Start checking blood pressure as able, record your numbers and have them with you for each cardiology and primary care doctor visit.   Below is your medication list that was discussed today. I've sorted it by what the medications are used for to make it easier. Always refer to you MyChart medication list for the most up to date doses/medications, but as of today this chart is up to date.   Medication Review: Medications as of 10/30/22 Patient confirms adherence to the following medications  ? Medication Frequency Notes   Blood pressure    Amlodipine 5 mg Once daily I messaged the cardiology team who had started the amlodipine medication in July to ensure they are aware you are taking both of these similar medications.   Felodipine 10 mg Once daily   Losartan 100 mg Once daily   Metoprolol succinate 50 mg Once daily   Cardiovascular/Heart Health   Aggrenox (aspirin/dipyridimole) Twice daily   Rosuvastatin 20 mg  Not taking Start in ~2 months  Anxiety/Depression   Bupropion XL 150 mg (Wellbutrin) Once daily   Buspirone 10 mg (Buspar) Twice daily   Lorazepam 1 mg 1/2 tablet as needed      Thyroid    Levothyroxine 137 mcg Alternating every other day   Levothyroxine 125 mcg Alternating every other day   Vitamins / Over the Counter Medications   Calcium/Vitamin D Twice daily   Folic Acid/B6/B12 Beatriz Chancellor) Once daily   Centrum Silver (Women 50+) Once daily   PreserVision Once daily     Berenice Primas, PharmD - Clinical Pharmacist

## 2022-10-30 NOTE — Progress Notes (Signed)
10/30/2022 Name: Rachel Donovan MRN: [id number] DOB: Feb 26, 1945  Subjective  No chief complaint on file.    Reason for visit: Rachel Donovan is a 77 y.o. year old female who presented for a telephone visit.   They were referred to the pharmacist by their PCP for assistance in assisting patient and family with a comprehensive  medication review  per family's request.   Care Team: Primary Care Provider: Eden Emms, NP Cardiology: Rachel Nordmann, MD  Reason for visit: ?  Rachel Donovan is a 77 y.o. female who presents today for a hypertension pharmacotherapy visit. Presents with husband and daughter Rachel Donovan).    Action At Last Visit: ?  9/23: Per family preference, discontinue atorvastatin for 2 months. If memory does improve start rosuvastatin 20 mg daily. Paper Script given.   Since Last visit / History of Present Illness: ?  9/10: Pt states that she does not see a specialist for her sodium and was hoping that her PCP could help with her low sodium levels. Guidance to help the sodium and electrolytes was given in the mychart message attached to the labs 9/6: Pt's husband, Rachel Donovan, came by office asking if there was a pharmacist that could give the pt help with med management. Rachel Donovan states the pt has memory issues & management would be very helpful for them.   Primary concern today is regarding patient's recent short-term memory changes (over the past ~3 months). Patient and family report that her mother had identical symptoms in terms of her memory changes, presenting in the same manner. They also wonder if any of her current medications could be contributing to her memory.    Access/Adherence Prescription drug coverage: Payor: MEDICARE / Plan: MEDICARE PART A AND B / Product Type: *No Product type* / . Reports that all medications are affordable. Her most expensive medication is Aggrenox though she feels the cost in manageable.   Current Patient Assistance: None  Denies issues with  medication adherence/organization. Her daughter Rachel Donovan helps to manage her medications and is extremely organized with this (fills monthly pill box).  HTN BP cuff at home: yes - Patient's cuff stopped working. Daughter dropped of a BP cuff today that patient will start to use.  Regularly checking BP at home: no   Denies lightheaded/dizziness. Denies headaches/vision changes.    Psych (Anxiety/Depression) Psych medications have been managed by Rachel Picket, FNP at [facility]. Patient reports long-term use of these medications which makes it tricky for her to report perceived efficacy/tolerability though recent CareEverywhere note from [place]: - 10/15/22: PHQ-9 = 2, GAD-7 = 7  - 09/17/22: PHQ-9 = 7, GAD-7 = 15 - 09/03/22: PHQ-9 = 18, GAD-7 = 21  Electrolyte Abnormalities Patient reports that since last speaking with PCP, they have worked on recommendations including increasing electrolytes/hydration as well as increasing protein/calories with protein shakes.   Patient and family have noticed an improvement in appetite and willingness to eat accompanies by some progress in weight regain. 113.6 lb yesterday.    Medication Review: Medications as of 10/30/22 Patient confirms adherence to the following medications  ? Medication Frequency Notes   HTN    Amlodipine 5 mg Once daily (prescribed 08/06/22) Some lower leg swelling but not frequent and is usually mild.  Felodipine 10 mg Once daily   Losartan 100 mg Once daily   Metoprolol succinate 50 mg Once daily   CV   Aggrenox Twice daily   Rosuvastatin 20 mg  Not taking 2 month  trial off atorvastatin, then plans to stat taking rosuvastatin  Anxiety/Depression   Bupropion XL 150 mg  Once daily Replaced escitalopram in ~August given concerns with hyponatremia  Buspirone 10 mg Twice daily Long-term  (<10 years)  Lorazepam 1 mg PRN (uses 1/2 tab about 3 times per week or less)   Last filled 2/24  Endo     Levothyroxine 137 mcg Alternating  every other day   Levothyroxine 125 mcg Alternating every other day   OTCs   Calcium/Vitamin D Twice daily   Folic Acid/B6/B12 Once daily   Centrum Silver (Women 50+) Once daily   PreserVision Once daily    As previously documented, stopped taking atorvastatin yesterday, 10/29/22 due to media controversy regarding statins and memory. Plans to start rosuvastatin in 2 months.   Assessment and Plan:    Called patient to review current medication lists per patient/family request. Spoke with patient, husband and daughter. We reviewed and updated current medications list and discussed indications for each medication. No adherence concerns.   Memory Regarding current medication list, there are no glaring concerns regarding gradual worsening of short-term memory other than lorazepam likely not helping acutely (though rare use).  Bupropion and buspirone have a low reported incidence of affecting memory (3%) however she had been tolerating prior to change in memory and they have been helping per improvement in GAD7 and PHQ9 scores.  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 Patient is taking both currently, cards added amlodipine 08/06/22, felodipine has been a long-term therapy prescribed by her previous PCP. Unclear if combination was intentional due to limited options with electrolyte abnormalities or if unintentional duplication of therapy given no mention of felodipine in any recent cardiology encounter. Patient denies edema/swelling.  Messaged Cardiology team for clarification.  Follow Up  Future Appointments  Date Time Provider Department [facility]  10/30/2022  1:30 PM [facility]CM PHARMACIST [facility]  11/01/2022 11:00 AM Rachel Emms, NP [facility]  12/11/2022 10:00 AM [facility] DIAG TOMO 1 [facility]  12/17/2022 10:30 AM [facility] NL VASC 1 [facility]  12/30/2022  1:15 PM Freddie Breech, DPM [facility]  01/14/2023  10:05 AM Swinyer, Zachary George, NP [facility]    Loree Fee, PharmD Clinical Pharmacist [facility] [phone]

## 2022-11-01 ENCOUNTER — Ambulatory Visit (INDEPENDENT_AMBULATORY_CARE_PROVIDER_SITE_OTHER): Payer: Medicare Other | Admitting: Nurse Practitioner

## 2022-11-01 ENCOUNTER — Encounter: Payer: Self-pay | Admitting: Nurse Practitioner

## 2022-11-01 VITALS — BP 124/88 | HR 75 | Temp 97.4°F | Ht 61.0 in | Wt 111.0 lb

## 2022-11-01 DIAGNOSIS — R413 Other amnesia: Secondary | ICD-10-CM | POA: Diagnosis not present

## 2022-11-01 DIAGNOSIS — E871 Hypo-osmolality and hyponatremia: Secondary | ICD-10-CM | POA: Diagnosis not present

## 2022-11-01 LAB — BASIC METABOLIC PANEL
BUN: 25 mg/dL — ABNORMAL HIGH (ref 6–23)
CO2: 30 meq/L (ref 19–32)
Calcium: 10.1 mg/dL (ref 8.4–10.5)
Chloride: 96 meq/L (ref 96–112)
Creatinine, Ser: 0.97 mg/dL (ref 0.40–1.20)
GFR: 56.31 mL/min — ABNORMAL LOW (ref >60.00)
Glucose, Bld: 121 mg/dL — ABNORMAL HIGH (ref 70–99)
Potassium: 4.4 meq/L (ref 3.5–5.1)
Sodium: 134 meq/L — ABNORMAL LOW (ref 135–145)

## 2022-11-01 NOTE — Addendum Note (Signed)
Addended by: Loree Fee on: 11/01/2022 04:15 PM   Modules accepted: Orders

## 2022-11-01 NOTE — Assessment & Plan Note (Signed)
Short-term memory still an issue.  We did perform MRI brain that showed no acute lesion or mass.  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  We will give patient 26-month holiday off of atorvastatin per cardiology to see if this helps with patient's memory when she returns we will repeat MMSE and discussed possible donepezil see if this benefits her memory are not.  This was discussed with patient's spouse and daughter-in-law who is at bedside

## 2022-11-01 NOTE — Patient Instructions (Signed)
Nice to see you today The medication we will consider is donepezil.  (Aricept) Make an appointment in approx 6 weeks, sooner if you need me

## 2022-11-01 NOTE — Assessment & Plan Note (Signed)
History of the same has been switched from Lexapro to Wellbutrin pending basic metabolic panel today to see if hyponatremia has improved

## 2022-11-01 NOTE — Progress Notes (Signed)
Established Patient Office Visit  Subjective   Patient ID: Rachel Donovan, female    DOB: 1946/01/25  Age: 77 y.o. MRN: [id number]  Chief Complaint  Patient presents with   Follow-up    Memory is the same as last visit. No change.    Medication Management    Off the statins until 11/23 due to memory.     HPI  Memory concern: patient was seen by me on 10/04/2022. She had a concern with her memory and so did her family. At the time the patient would have repeat converstatoins and would forget if she has taken her morning medications. 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 They plan on starting back on crestor in approx 2 months. She also have a carotid US scheduled in approx 1 month   Anxiety: patient was switched form lexapro to welbutrin because of her persistent hyponatremia.        10/04/2022   12:55 PM  MMSE - Mini Mental State Exam  Orientation to time 2  Orientation to Place 4  Registration 3  Attention/ Calculation 5  Recall 2  Language- name 2 objects 2  Language- repeat 1  Language- follow 3 step command 3  Language- read & follow direction 1  Write a sentence 1  Copy design 1  Total score 25      ROS    Objective:     BP 124/88   Pulse 75   Temp (!) 97.4 F (36.3 C) (Temporal)   Ht 5\' 1"  (1.549 m)   Wt 111 lb (50.3 kg)   SpO2 97%   BMI 20.97 kg/m  BP Readings from Last 3 Encounters:  11/01/22 124/88  10/28/22 (!) 145/76  10/28/22 130/72   Wt Readings from Last 3 Encounters:  11/01/22 111 lb (50.3 kg)  10/28/22 113 lb 9.6 oz (51.5 kg)  10/04/22 111 lb 6.4 oz (50.5 kg)       Physical Exam   No results found for any visits on 11/01/22.    The ASCVD Risk score (Arnett DK, et al., 2019) failed to calculate for the following reasons:   The patient has a prior MI or stroke diagnosis    Assessment & Plan:   Problem List Items Addressed This Visit       Other   Memory impairment - Primary    Short-term memory still an issue.  We did perform MRI brain that showed no acute lesion or mass.  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  We will give patient 33-month holiday off of atorvastatin per cardiology to see if this helps with patient's memory when she returns we will repeat MMSE and discussed possible donepezil see if this benefits her memory are not.  This was discussed with patient's spouse and daughter-in-law who is at bedside      Relevant Orders   Basic metabolic panel   Hyponatremia    History of the same has been switched from Lexapro to Wellbutrin pending basic metabolic panel today to see if hyponatremia has improved      Relevant Orders   Basic metabolic  panel    Return in about 6 weeks (around 12/13/2022) for MMSE/memory check .    Audria Nine, NP

## 2022-11-12 ENCOUNTER — Encounter (HOSPITAL_COMMUNITY): Payer: Medicare Other

## 2022-11-21 ENCOUNTER — Encounter: Payer: Self-pay | Admitting: Cardiovascular Disease

## 2022-12-09 ENCOUNTER — Ambulatory Visit: Payer: Medicare Other | Admitting: Nurse Practitioner

## 2022-12-16 ENCOUNTER — Other Ambulatory Visit: Payer: Self-pay | Admitting: Endocrinology

## 2022-12-16 ENCOUNTER — Encounter: Payer: Self-pay | Admitting: Endocrinology

## 2022-12-16 ENCOUNTER — Other Ambulatory Visit: Payer: Self-pay | Admitting: Nurse Practitioner

## 2022-12-16 DIAGNOSIS — N6489 Other specified disorders of breast: Secondary | ICD-10-CM

## 2022-12-17 ENCOUNTER — Ambulatory Visit (HOSPITAL_COMMUNITY)
Admission: RE | Admit: 2022-12-17 | Discharge: 2022-12-17 | Disposition: A | Discharge status: Home / Self Care | Payer: Medicare Other | Source: Ambulatory Visit | Attending: Cardiology | Admitting: Cardiology

## 2022-12-17 DIAGNOSIS — R42 Dizziness and giddiness: Secondary | ICD-10-CM | POA: Diagnosis present

## 2022-12-17 DIAGNOSIS — I2510 Atherosclerotic heart disease of native coronary artery without angina pectoris: Secondary | ICD-10-CM | POA: Insufficient documentation

## 2022-12-17 DIAGNOSIS — E782 Mixed hyperlipidemia: Secondary | ICD-10-CM | POA: Diagnosis present

## 2022-12-17 DIAGNOSIS — I10 Essential (primary) hypertension: Secondary | ICD-10-CM | POA: Insufficient documentation

## 2022-12-27 ENCOUNTER — Ambulatory Visit
Admission: RE | Admit: 2022-12-27 | Discharge: 2022-12-27 | Disposition: A | Discharge status: Home / Self Care | Payer: Medicare Other | Source: Ambulatory Visit | Attending: Endocrinology | Admitting: Endocrinology

## 2022-12-27 DIAGNOSIS — N6489 Other specified disorders of breast: Secondary | ICD-10-CM | POA: Diagnosis present

## 2022-12-30 ENCOUNTER — Ambulatory Visit: Payer: Medicare Other | Admitting: Podiatry

## 2022-12-31 ENCOUNTER — Encounter: Payer: Self-pay | Admitting: Nurse Practitioner

## 2022-12-31 ENCOUNTER — Ambulatory Visit: Payer: Medicare Other | Admitting: Nurse Practitioner

## 2022-12-31 VITALS — BP 130/84 | HR 65 | Temp 98.3°F | Ht 61.0 in | Wt 111.8 lb

## 2022-12-31 DIAGNOSIS — R413 Other amnesia: Secondary | ICD-10-CM | POA: Diagnosis not present

## 2022-12-31 DIAGNOSIS — F325 Major depressive disorder, single episode, in full remission: Secondary | ICD-10-CM | POA: Diagnosis not present

## 2022-12-31 DIAGNOSIS — I10 Essential (primary) hypertension: Secondary | ICD-10-CM | POA: Diagnosis not present

## 2022-12-31 DIAGNOSIS — F411 Generalized anxiety disorder: Secondary | ICD-10-CM | POA: Insufficient documentation

## 2022-12-31 MED ORDER — LORAZEPAM 1 MG PO TABS
1.0000 mg | ORAL_TABLET | Freq: Every day | ORAL | 1 refills | Status: DC | PRN
Start: 2022-12-31 — End: 2023-05-23

## 2022-12-31 MED ORDER — BUSPIRONE HCL 15 MG PO TABS
15.0000 mg | ORAL_TABLET | Freq: Two times a day (BID) | ORAL | 1 refills | Status: DC
Start: 2022-12-31 — End: 2023-07-03

## 2022-12-31 NOTE — Assessment & Plan Note (Signed)
Blood pressure above goal at initial check within normal limits on recheck.  Continue taking amlodipine, metoprolol, losartan.  Patient is been checking blood pressure at home

## 2022-12-31 NOTE — Assessment & Plan Note (Signed)
His daughters report improvement since coming off the atorvastatin.  We will continue to follow in office visits defer any memory medication at this juncture

## 2022-12-31 NOTE — Progress Notes (Signed)
Established Patient Office Visit  Subjective   Patient ID: Rachel Donovan, female    DOB: 1945-09-03  Age: 77 y.o. MRN: [id number]  Chief Complaint  Patient presents with   Follow-up    Memory has been on and off.     HPI  Memory concern: Patient was seen by me on 10/04/2022 and again on 11/01/2022 with concern for her memory.  At this juncture patient will have repeat conversations and sometimes will forget if she taken her morning medications.  Does have a history of hypothyroidism, H LD, HTN, CVA, anxiety and depression.  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  At last office visit she was talking with her cardiology provider and they will take a holiday off of the atorvastatin for approximately 2 months.  With plan of starting on Crestor thereafter she is also supposed have a carotid ultrasound.  Patient is here for follow-up  She is present with her daughter.  Patient recently returned from the beach after being referred 1 month and seem to do well with her spouse on vacation.  Patient did have carotid ultrasound which looks within normal limits has not started rosuvastatin 20 mg yet.  Encourage patient to go ahead and start taking medication. Patient states her spouse is having some hallucinations currently is scheduled to have an MRI of the brain in December her anxiety is a little more than what traditionally has been.  Patient currently on bupropion, buspirone, lorazepam as needed.  Patient is requesting refill for lorazepam today.      10/04/2022   12:55 PM  MMSE - Mini Mental State Exam  Orientation to time 2  Orientation to Place 4  Registration 3  Attention/ Calculation 5  Recall 2  Language- name 2 objects 2  Language- repeat 1  Language- follow 3 step command 3  Language- read &  follow direction 1  Write a sentence 1  Copy design 1  Total score 25      12/31/2022    9:58 AM 11/01/2022   11:21 AM  GAD 7 : Generalized Anxiety Score  Nervous, Anxious, on Edge 2 1  Control/stop worrying 2 1  Worry too much - different things 2 1  Trouble relaxing 2 1  Restless 0 1  Easily annoyed or irritable 2 0  Afraid - awful might happen 2 0  Total GAD 7 Score 12 5  Anxiety Difficulty Not difficult at all Not difficult at all        12/31/2022    9:58 AM 11/01/2022   11:18 AM  PHQ9 SCORE ONLY  PHQ-9 Total Score 1 1      Review of Systems  Constitutional:  Negative for chills and fever.  Respiratory:  Negative for shortness of breath.   Cardiovascular:  Negative for chest pain.  Neurological:  Negative for headaches.  Psychiatric/Behavioral:  Negative for hallucinations and suicidal ideas. The patient has insomnia.       Objective:     BP 130/84   Pulse 65   Temp 98.3 F (36.8 C) (Oral)   Ht 5\' 1"  (1.549 m)   Wt 111 lb 12.8 oz (50.7 kg)   SpO2 97%   BMI 21.12 kg/m  BP Readings from Last 3 Encounters:  12/31/22 130/84  11/01/22 124/88  10/28/22 (!) 145/76   Wt Readings from Last 3 Encounters:  12/31/22 111 lb 12.8 oz (50.7 kg)  11/01/22 111 lb (50.3 kg)  10/28/22 113 lb 9.6 oz (51.5 kg)   SpO2 Readings from Last 3 Encounters:  12/31/22 97%  11/01/22 97%  10/28/22 95%      Physical Exam Vitals and nursing note reviewed.  Constitutional:      Appearance: Normal appearance.  Cardiovascular:     Rate and Rhythm: Normal rate and regular rhythm.     Heart sounds: Normal heart sounds.  Pulmonary:     Effort: Pulmonary effort is normal.     Breath sounds: Normal breath sounds.  Neurological:     Mental Status: She is alert.      No results found for any visits on 12/31/22.    The ASCVD Risk score (Arnett DK, et al., 2019) failed to calculate for the following reasons:   The patient has a prior MI or stroke diagnosis     Assessment & Plan:   Problem List Items Addressed This Visit       Cardiovascular and Mediastinum   Essential hypertension - Primary    Blood pressure above goal at initial check within normal limits on recheck.  Continue taking amlodipine, metoprolol, losartan.  Patient is been checking blood pressure at home      Relevant Medications   rosuvastatin (CRESTOR) 20 MG tablet     Other   Major depression, single episode    Continue using bupropion 100 mg daily.  Patient denies HI/SI/AVH.      Relevant Medications   LORazepam (ATIVAN) 1 MG tablet   busPIRone (BUSPAR) 15 MG tablet   Memory impairment    His daughters report improvement since coming off the atorvastatin.  We will continue to follow in office visits defer any memory medication at this juncture      GAD (generalized anxiety disorder)    Patient currently maintained on bupropion 150 mg daily along with buspirone 10 mg twice daily and lorazepam 0.5 mg daily as needed.  Will increase buspirone to 50 mg twice daily continue bupropion as is and refill lorazepam.  Patient's last prescription lasted well over a year      Relevant Medications   LORazepam (ATIVAN) 1 MG tablet   busPIRone (BUSPAR) 15 MG tablet    Return in about 3 months (around 04/02/2023) for GAD/MDD/memory recheck .    Audria Nine, NP

## 2022-12-31 NOTE — Patient Instructions (Signed)
Nice to see you today You can go ahead and fill the Crestor (rosuvastatin) I have increase the buspar (buspirone) to 15mg  twice a day Follow up with me in 3 months, sooner if you need me

## 2022-12-31 NOTE — Assessment & Plan Note (Signed)
Patient currently maintained on bupropion 150 mg daily along with buspirone 10 mg twice daily and lorazepam 0.5 mg daily as needed.  Will increase buspirone to 50 mg twice daily continue bupropion as is and refill lorazepam.  Patient's last prescription lasted well over a year

## 2022-12-31 NOTE — Assessment & Plan Note (Signed)
Continue using bupropion 100 mg daily.  Patient denies HI/SI/AVH.

## 2023-01-14 ENCOUNTER — Ambulatory Visit: Payer: Medicare Other | Admitting: Nurse Practitioner

## 2023-01-19 NOTE — Progress Notes (Unsigned)
Cardiology Office Note:  .   Date:  01/23/2023  ID:  Rachel Donovan, DOB 15-Dec-1945, MRN [id number] PCP: Eden Emms, NP  [facility] Providers Cardiologist:  Julien Nordmann, MD    Patient Profile: .      PMH Hypertension Hypothyroidism Prior tobacco abuse Anxiety CVA of unclear etiology in 2006 Elevated coronary calcium score 103 (66th percentile) 01/2019  Previously seen by Dr. Mariah Milling, she has more recently been followed by Dr. Elease Hashimoto.  She has been followed for hypertension.  She attributes higher BP to anxiety.  She was told she had an irregular heart rhythm by her PCP.  In 20 showed SR with PACs.  Possible TIA/CVA in 2006, she was prescribed Aggrenox.  Has had difficult to control BP.    Last cardiology clinic visit was 08/06/2022 with Dr. Elease Hashimoto.She had stopped her HCTZ and Lasix because of concerns with hyponatremia. She was advised to start amlodipine,5 mg daily in addition to losartan.   Seen by me in clinic on 10/28/22, accompanied by her daughter-in-law, Rachel Donovan. Patient reports she is no longer driving due to memory concerns. Larey Seat a few months ago while in her pantry, felt like she got dizzy but did not lose consciousness.  No further episodes of presyncope or syncope, although she describes some episodes of dizziness. Has not been walking for exercise recently.  Has not been monitoring home BP because monitor has not been working. Her family will work on getting one. Planning to travel to the beach for about 1 month and will have various family members in and out visiting. She denied chest pain, shortness of breath, lower extremity edema, fatigue, palpitations, orthopnea, and PND. Carotid duplex was ordered to r/o stenosis that may be contributing to dizziness and revealed near normal blood flow bilaterally. BP was stable and no changes were made to anti-hypertensive therapy. She wished to discontinue  atorvastatin to see if it was contributing to memory loss. She was to try rosuvastatin after a 2 month statin holiday.        History of Present Illness: .   Rachel Donovan is a very pleasant 77 y.o. female who is here today for 3 month follow-up of hypertension. She reports she is feeling well. Is not sure what the specific improvement is, but feels better than when I saw her on 9/23.   ROS: See HPI       Studies Reviewed: .        Risk Assessment/Calculations:     HYPERTENSION CONTROL Vitals:   01/23/23 1410 01/23/23 1623  BP: (!) 142/76 (!) 160/80    The patient's blood pressure is elevated above target today.  In order to address the patient's elevated BP:           Physical Exam:   VS:  BP (!) 160/80   Pulse 73   Ht 5\' 1"  (1.549 m)   Wt 113 lb (51.3 kg)   SpO2 94%   BMI 21.35 kg/m    Wt Readings from Last 3 Encounters:  01/23/23 113 lb (51.3 kg)  12/31/22 111 lb 12.8 oz (50.7 kg)  11/01/22 111 lb (50.3 kg)    GEN: Well nourished, well developed in no acute distress NECK: No JVD; No carotid bruits CARDIAC: RRR, no murmurs, rubs, gallops RESPIRATORY:  Clear to auscultation without rales, wheezing or rhonchi  ABDOMEN: Soft, non-tender, non-distended EXTREMITIES:  No edema; No deformity     ASSESSMENT AND PLAN: .    Memory impairment:  She continues to have memory impairment. No significant improvement while off statin. She started rosuvastatin early December.   CAD without angina: CT calcium score of 103 (66th percentile) on 01/26/2019. She denies chest pain, dyspnea, or other symptoms concerning for angina. No indication for further ischemic evaluation at this time. Has been on Aggrenox for many years due to history of stroke. No bleeding concerns.  She is tolerating rosuvastatin without any concerning side effects. Continue amlodipine, metoprolol, losartan, rosuvastatin.   Hypertension: BP is elevated and remains so on my recheck.  Home BP well controlled at times  but more often is elevated > 140/80.  Lengthy discussion about timing of taking antihypertensive medications and monitoring home BP.  She has been checking BP prior to 2 hours after taking meds.  We will have her consistently monitor BP for 2 weeks with appropriate technique and report back for making medication adjustments. Consider increasing amlodipine to 7.5 mg daily if BP remains consistently > 140/80.   Hyperlipidemia LDL goal < 70: LDL 66 on 10/04/22. She started rosuvastatin 20 mg after a 2 month statin holiday after stopping atorvastatin. There was no improvement in memory. Will plan to repeat lipid panel at next office visit if not done sooner. Continue rosuvastatin.   History of stroke: No s/s of stroke. Continue to aim for BP < 140/80.  Sinew Aggrenox and rosuvastatin.      Dispo: 6 months with me  Signed, Eligha Bridegroom, NP-C

## 2023-01-23 ENCOUNTER — Ambulatory Visit: Payer: Medicare Other | Attending: Nurse Practitioner | Admitting: Nurse Practitioner

## 2023-01-23 ENCOUNTER — Encounter: Payer: Self-pay | Admitting: Nurse Practitioner

## 2023-01-23 VITALS — BP 160/80 | HR 73 | Ht 61.0 in | Wt 113.0 lb

## 2023-01-23 DIAGNOSIS — I2510 Atherosclerotic heart disease of native coronary artery without angina pectoris: Secondary | ICD-10-CM

## 2023-01-23 DIAGNOSIS — E785 Hyperlipidemia, unspecified: Secondary | ICD-10-CM

## 2023-01-23 DIAGNOSIS — R413 Other amnesia: Secondary | ICD-10-CM | POA: Diagnosis present

## 2023-01-23 DIAGNOSIS — I10 Essential (primary) hypertension: Secondary | ICD-10-CM

## 2023-01-23 DIAGNOSIS — Z8673 Personal history of transient ischemic attack (TIA), and cerebral infarction without residual deficits: Secondary | ICD-10-CM | POA: Diagnosis present

## 2023-01-23 NOTE — Patient Instructions (Signed)
Medication Instructions:  Your physician recommends that you continue on your current medications as directed. Please refer to the Current Medication list given to you today.  *If you need a refill on your cardiac medications before your next appointment, please call your pharmacy*   Lab Work: None ordered. If you have labs (blood work) drawn today and your tests are completely normal, you will receive your results only by: [place] (if you have MyChart) OR A paper copy in the mail If you have any lab test that is abnormal or we need to change your treatment, we will call you to review the results.   Testing/Procedures: None ordered.   Follow-Up: At [facility], you and your health needs are our priority.  As part of our continuing mission to provide you with exceptional heart care, we have created designated Provider Care Teams.  These Care Teams include your primary Cardiologist (physician) and Advanced Practice Providers (APPs -  Physician Assistants and Nurse Practitioners) who all work together to provide you with the care you need, when you need it.  We recommend signing up for the patient portal called "MyChart".  Sign up information is provided on this After Visit Summary.  MyChart is used to connect with patients for Virtual Visits (Telemedicine).  Patients are able to view lab/test results, encounter notes, upcoming appointments, etc.  Non-urgent messages can be sent to your provider as well.   To learn more about what you can do with MyChart, go to [email or web address].    Your next appointment:   6 month(s)  Provider:   Christen Bame, NP         Other Instructions Your physician wants you to follow-up in: 6 months. You will receive a reminder letter in the mail two months in advance. If you don't receive a letter, please call our office to schedule the follow-up appointment.

## 2023-02-09 ENCOUNTER — Emergency Department: Payer: Medicare Other

## 2023-02-09 ENCOUNTER — Other Ambulatory Visit: Payer: Self-pay

## 2023-02-09 DIAGNOSIS — N179 Acute kidney failure, unspecified: Secondary | ICD-10-CM | POA: Insufficient documentation

## 2023-02-09 DIAGNOSIS — I10 Essential (primary) hypertension: Secondary | ICD-10-CM | POA: Diagnosis not present

## 2023-02-09 DIAGNOSIS — R55 Syncope and collapse: Secondary | ICD-10-CM | POA: Diagnosis present

## 2023-02-09 DIAGNOSIS — E039 Hypothyroidism, unspecified: Secondary | ICD-10-CM | POA: Insufficient documentation

## 2023-02-09 DIAGNOSIS — Z8673 Personal history of transient ischemic attack (TIA), and cerebral infarction without residual deficits: Secondary | ICD-10-CM | POA: Insufficient documentation

## 2023-02-09 LAB — BASIC METABOLIC PANEL
Anion gap: 13 (ref 5–15)
BUN: 34 mg/dL — ABNORMAL HIGH (ref 8–23)
CO2: 24 mmol/L (ref 22–32)
Calcium: 9.8 mg/dL (ref 8.9–10.3)
Chloride: 95 mmol/L — ABNORMAL LOW (ref 98–111)
Creatinine, Ser: 1.28 mg/dL — ABNORMAL HIGH (ref 0.44–1.00)
GFR, Estimated: 43 mL/min — ABNORMAL LOW (ref >60)
Glucose, Bld: 187 mg/dL — ABNORMAL HIGH (ref 70–99)
Potassium: 4.1 mmol/L (ref 3.5–5.1)
Sodium: 132 mmol/L — ABNORMAL LOW (ref 135–145)

## 2023-02-09 LAB — URINALYSIS, ROUTINE W REFLEX MICROSCOPIC
Bilirubin Urine: NEGATIVE
Glucose, UA: NEGATIVE mg/dL
Hgb urine dipstick: NEGATIVE
Ketones, ur: NEGATIVE mg/dL
Leukocytes,Ua: NEGATIVE
Nitrite: NEGATIVE
Protein, ur: 30 mg/dL — AB
Specific Gravity, Urine: 1.02 (ref 1.005–1.030)
pH: 5 (ref 5.0–8.0)

## 2023-02-09 LAB — CBC
HCT: 42 % (ref 36.0–46.0)
Hemoglobin: 14.7 g/dL (ref 12.0–15.0)
MCH: 32.7 pg (ref 26.0–34.0)
MCHC: 35 g/dL (ref 30.0–36.0)
MCV: 93.5 fL (ref 80.0–100.0)
Platelets: 168 10*3/uL (ref 150–400)
RBC: 4.49 MIL/uL (ref 3.87–5.11)
RDW: 11.6 % (ref 11.5–15.5)
WBC: 6.7 10*3/uL (ref 4.0–10.5)
nRBC: 0 % (ref 0.0–0.2)

## 2023-02-09 LAB — CBG MONITORING, ED: Glucose-Capillary: 203 mg/dL — ABNORMAL HIGH (ref 70–99)

## 2023-02-09 NOTE — ED Triage Notes (Signed)
 Pt BIB [facility] from home following a syncopal episode. Pt was standing when she became dizzy, LOC, and fell face forward onto the floor. Husband reports to EMS  pt unconscious for 3 min.Pt A&O4. Pt denies pain. No visible injury. No tenderness to head, neck, arms, nor legs. No dizziness. Prior to this episode pt was in her usual state of health. No NVD.

## 2023-02-10 ENCOUNTER — Emergency Department
Admission: EM | Admit: 2023-02-10 | Discharge: 2023-02-10 | Disposition: A | Discharge status: Home / Self Care | Payer: Medicare Other | Attending: Emergency Medicine | Admitting: Emergency Medicine

## 2023-02-10 DIAGNOSIS — R55 Syncope and collapse: Secondary | ICD-10-CM

## 2023-02-10 DIAGNOSIS — N179 Acute kidney failure, unspecified: Secondary | ICD-10-CM

## 2023-02-10 LAB — MAGNESIUM: Magnesium: 2.3 mg/dL (ref 1.7–2.4)

## 2023-02-10 LAB — TSH: TSH: 0.407 u[IU]/mL (ref 0.350–4.500)

## 2023-02-10 LAB — TROPONIN I (HIGH SENSITIVITY): Troponin I (High Sensitivity): 9 ng/L (ref <18)

## 2023-02-10 LAB — T4, FREE: Free T4: 1.44 ng/dL — ABNORMAL HIGH (ref 0.61–1.12)

## 2023-02-10 NOTE — ED Provider Notes (Signed)
 [facility] Provider Note    Event Date/Time   First MD Initiated Contact with Patient 02/10/23 [id number]     (approximate)   History   Loss of Consciousness   HPI  Rachel Donovan is a 78 y.o. female   Past medical history of prior CVA, hypothyroid, hypertension hyperlipidemia presents to the Emergency Department with a syncopal episode.  She was in her regular state of health, no recent illnesses, when she went to the counter to take her vitamins in the next thing she knows she was waking up on the ground with her husband by her side.  She had no presyncopal symptoms  Her husband heard a thud and came to her side immediately from the other room.  She was unconscious for a couple of minutes and then was completely awake and alert when she awoke.  She has no pain.  She feels completely fine now.  Of note she has lost some weight this last year for which she has seen her primary doctor and has no diagnosis at this time.   She had 1 similar fainting episode 2 years ago without presyncopal symptoms, follow-up with her primary doctor and had normal testing at that time.  Independent Historian contributed to assessment above: Husband at bedside corroborates information past medical history as above     Physical Exam   Triage Vital Signs: ED Triage Vitals  Encounter Vitals Group     BP 02/09/23 2150 130/87     Systolic BP Percentile --      Diastolic BP Percentile --      Pulse Rate 02/09/23 2150 62     Resp 02/09/23 2150 19     Temp 02/09/23 2150 (!) 97.5 F (36.4 C)     Temp Source 02/09/23 2150 Oral     SpO2 02/09/23 2150 99 %     Weight 02/09/23 2149 113 lb (51.3 kg)     Height 02/09/23 2149 5' 1 (1.549 m)     Head Circumference --      Peak Flow --      Pain Score 02/09/23 2149 0     Pain Loc --      Pain Education --      Exclude from Growth Chart --     Most recent vital signs: Vitals:   02/10/23 0240 02/10/23 0355  BP: (!) 158/78 (!)  173/79  Pulse: 70   Resp: 18 18  Temp: 97.8 F (36.6 C)   SpO2: 94% 95%    General: Awake, no distress.  CV:  Good peripheral perfusion.  Resp:  Normal effort.  Abd:  No distention.  Other:  Pleasant woman in no acute distress, hypertension otherwise vital signs are normal.  No facial asymmetry dysarthria or motor or sensory deficits and finger-to-nose is normal.  She has normal heart sounds without murmurs, clear lungs, and appears euvolemic.   ED Results / Procedures / Treatments   Labs (all labs ordered are listed, but only abnormal results are displayed) Labs Reviewed  BASIC METABOLIC PANEL - Abnormal; Notable for the following components:      Result Value   Sodium 132 (*)    Chloride 95 (*)    Glucose, Bld 187 (*)    BUN 34 (*)    Creatinine, Ser 1.28 (*)    GFR, Estimated 43 (*)    All other components within normal limits  URINALYSIS, ROUTINE W REFLEX MICROSCOPIC - Abnormal; Notable for the following components:  Color, Urine YELLOW (*)    APPearance CLOUDY (*)    Protein, ur 30 (*)    Bacteria, UA RARE (*)    All other components within normal limits  T4, FREE - Abnormal; Notable for the following components:   Free T4 1.44 (*)    All other components within normal limits  CBG MONITORING, ED - Abnormal; Notable for the following components:   Glucose-Capillary 203 (*)    All other components within normal limits  CBC  MAGNESIUM  TSH  TROPONIN I (HIGH SENSITIVITY)     I ordered and reviewed the above labs they are notable for creatinine is mildly elevated at 1.28 compared to prior.  Serial troponins have been negative.  EKG  ED ECG REPORT I, Ginnie Shams, the attending physician, personally viewed and interpreted this ECG.   Date: 02/10/2023  EKG Time: 2154  Rate: 62  Rhythm: sinus  Axis: nl  Intervals:rbbb  ST&T Change: no stemi    RADIOLOGY I independently reviewed and interpreted CT of the head see no obvious bleeding or midline shift I  also reviewed radiologist's formal read.   PROCEDURES:  Critical Care performed: No  Procedures   MEDICATIONS ORDERED IN ED: Medications - No data to display  IMPRESSION / MDM / ASSESSMENT AND PLAN / ED COURSE  I reviewed the triage vital signs and the nursing notes.                                Patient's presentation is most consistent with acute presentation with potential threat to life or bodily function.  Differential diagnosis includes, but is not limited to, cardiogenic syncope, dysrhythmia, electrolyte disturbance, orthostatic hypotension, vasovagal, stroke, head injury or ICH   The patient is on the cardiac monitor to evaluate for evidence of arrhythmia and/or significant heart rate changes.  MDM:    Syncopal episode with no presyncopal symptoms, second time in 2 years that this is recurred.  No other acute medical complaints and feels well now.  EKG with no malignant dysrhythmias or ischemic changes.  Labs unremarkable aside from mild AKI.  No focal neurologic deficits to history of stroke.  CT head negative.  She has had weight loss over the last 1 years being worked up by her primary doctor.  Given asymptomatic currently, normal vital signs aside from hypertension, no focal neurologic deficits to suggest stroke, normal troponins, I think she can follow-up with an outpatient for further syncopal workup as needed.  She has a cardiologist and follow-up with them as well.  Return precautions given.       FINAL CLINICAL IMPRESSION(S) / ED DIAGNOSES   Final diagnoses:  Syncope and collapse  AKI (acute kidney injury) (HCC)     Rx / DC Orders   ED Discharge Orders     None        Note:  This document was prepared using Dragon voice recognition software and may include unintentional dictation errors.    Shams Ginnie, MD 02/10/23 [phone]

## 2023-02-10 NOTE — Discharge Instructions (Signed)
 There were no emergency findings to account for your fainting event today.  Your kidney levels were slightly abnormal indicating perhaps a mild degree of dehydration, so drink plenty of fluids and have your doctor recheck these levels within the next 1 to 2 weeks.  Call your heart doctor and your regular doctor for follow-up appointment.   Thank you for choosing us  for your health care today!  Please see your primary doctor this week for a follow up appointment.   If you have any new, worsening, or unexpected symptoms call your doctor right away or come back to the emergency department for reevaluation.  It was my pleasure to care for you today.   Ginnie EDISON Cyrena, MD

## 2023-02-12 ENCOUNTER — Ambulatory Visit (INDEPENDENT_AMBULATORY_CARE_PROVIDER_SITE_OTHER): Payer: Medicare Other | Admitting: Nurse Practitioner

## 2023-02-12 ENCOUNTER — Telehealth: Payer: Self-pay | Admitting: Nurse Practitioner

## 2023-02-12 VITALS — BP 152/80 | HR 76 | Temp 97.6°F | Ht 61.0 in | Wt 114.2 lb

## 2023-02-12 DIAGNOSIS — R944 Abnormal results of kidney function studies: Secondary | ICD-10-CM | POA: Diagnosis not present

## 2023-02-12 DIAGNOSIS — R55 Syncope and collapse: Secondary | ICD-10-CM

## 2023-02-12 DIAGNOSIS — I10 Essential (primary) hypertension: Secondary | ICD-10-CM | POA: Diagnosis not present

## 2023-02-12 DIAGNOSIS — E860 Dehydration: Secondary | ICD-10-CM | POA: Diagnosis not present

## 2023-02-12 LAB — BASIC METABOLIC PANEL
BUN: 30 mg/dL — ABNORMAL HIGH (ref 6–23)
CO2: 28 meq/L (ref 19–32)
Calcium: 9.6 mg/dL (ref 8.4–10.5)
Chloride: 98 meq/L (ref 96–112)
Creatinine, Ser: 0.99 mg/dL (ref 0.40–1.20)
GFR: 54.84 mL/min — ABNORMAL LOW (ref >60.00)
Glucose, Bld: 139 mg/dL — ABNORMAL HIGH (ref 70–99)
Potassium: 3.9 meq/L (ref 3.5–5.1)
Sodium: 134 meq/L — ABNORMAL LOW (ref 135–145)

## 2023-02-12 NOTE — Assessment & Plan Note (Signed)
 Patient patient is trying at least 50 ounces of water a day.  Pending BMP today she could also see about for 0 sugar electrolyte drinks

## 2023-02-12 NOTE — Telephone Encounter (Signed)
 Copied from CRM [id number]. Topic: General - Other >> Feb 12, 2023  3:10 PM [name] wrote: Reason for CRM: Rachel Donovan called about her appointment today and she forgot that she has passed out before (back in May, 2024). She did go to the hospital and nothing was found to wrong.  FYI

## 2023-02-12 NOTE — Telephone Encounter (Signed)
 Noted. We did discuss this in office

## 2023-02-12 NOTE — Assessment & Plan Note (Signed)
 Likely related to dehydration.  Pending BMP today encouraged patient to drink water

## 2023-02-12 NOTE — Patient Instructions (Addendum)
 Nice to see you today I have referred you to the neurologist Follow up with cardiology Dr. Melburn Popper  Follow up with me as scheduled

## 2023-02-12 NOTE — Assessment & Plan Note (Signed)
 Blood pressure elevated today in office.  She is followed by cardiology recommended follow-up in regards to syncope

## 2023-02-12 NOTE — Progress Notes (Signed)
 Established Patient Office Visit  Subjective   Patient ID: Rachel Donovan, female    DOB: 1945-04-05  Age: 78 y.o. MRN: [id number]  Chief Complaint  Patient presents with   Hospitalization Follow-up    Pt complains of feeling better since leaving ED for syncope.    HPI   Hosptial follow up: Patient was seen in the emergency department on 02/10/2023 with a diagnosis of syncope and collapse.  Tell her to take her vitamins Sure if she is waking up on the ground with her husband at her side patient denies any presyncopal symptoms.  Patient states has been doing well since the hospitalization.  Patient does not recall being dizzy or lightheaded prior to the syncopal episode.  Patient states that she likely had something to eat today but is not the best with water intake.  As far she remembers she did not stand up and walk to the counter she was up and about prior to having the syncopal episode.  Patient has history of same in the past she is followed by cardiology but not currently followed by neurology.  Of note patient did have MRI of brain in September 2024   Review of Systems  Constitutional:  Negative for chills and fever.  Respiratory:  Negative for shortness of breath.   Cardiovascular:  Negative for chest pain.  Neurological:  Negative for dizziness, loss of consciousness and headaches.      Objective:     BP (!) 152/80   Pulse 76   Temp 97.6 F (36.4 C) (Oral)   Ht 5' 1 (1.549 m)   Wt 114 lb 3.2 oz (51.8 kg)   SpO2 98%   BMI 21.58 kg/m  BP Readings from Last 3 Encounters:  02/12/23 (!) 152/80  02/10/23 (!) 173/79  01/23/23 (!) 160/80   Wt Readings from Last 3 Encounters:  02/12/23 114 lb 3.2 oz (51.8 kg)  02/09/23 113 lb (51.3 kg)  01/23/23 113 lb (51.3 kg)   SpO2 Readings from Last 3 Encounters:  02/12/23 98%  02/10/23 95%  01/23/23 94%      Physical Exam Vitals and nursing note reviewed.  Constitutional:      Appearance: Normal appearance.   Cardiovascular:     Rate and Rhythm: Normal rate and regular rhythm.     Heart sounds: Normal heart sounds.  Pulmonary:     Effort: Pulmonary effort is normal.     Breath sounds: Normal breath sounds.  Neurological:     General: No focal deficit present.     Mental Status: She is alert.     Cranial Nerves: Cranial nerves 2-12 are intact.     Sensory: Sensation is intact.     Motor: Motor function is intact.     Coordination: Coordination is intact.     Deep Tendon Reflexes:     Reflex Scores:      Bicep reflexes are 2+ on the right side and 2+ on the left side.      Patellar reflexes are 2+ on the right side and 2+ on the left side.    Comments: Bilateral upper and lower extremity strength 5/5      No results found for any visits on 02/12/23.    The ASCVD Risk score (Arnett DK, et al., 2019) failed to calculate for the following reasons:   Risk score cannot be calculated because patient has a medical history suggesting prior/existing ASCVD    Assessment & Plan:   Problem List Items  Addressed This Visit       Cardiovascular and Mediastinum   Essential hypertension   Blood pressure elevated today in office.  She is followed by cardiology recommended follow-up in regards to syncope        Other   Syncope and collapse - Primary   Relevant Orders   Ambulatory referral to Neurology   Dehydration   Patient patient is trying at least 50 ounces of water a day.  Pending BMP today she could also see about for 0 sugar electrolyte drinks      Relevant Orders   Basic metabolic panel   Decreased GFR   Likely related to dehydration.  Pending BMP today encouraged patient to drink water      Relevant Orders   Basic metabolic panel    Return if symptoms worsen or fail to improve, for As scheduled .    Adina Crandall, NP

## 2023-02-17 ENCOUNTER — Encounter: Payer: Self-pay | Admitting: Nurse Practitioner

## 2023-02-17 ENCOUNTER — Telehealth: Payer: Self-pay | Admitting: *Deleted

## 2023-02-17 NOTE — Progress Notes (Signed)
 Complex Care Management Note Care Guide Note  02/17/2023 Name: Rachel Donovan MRN: [id number] DOB: 1945-09-07   Complex Care Management Outreach Attempts: An unsuccessful telephone outreach was attempted today to offer the patient information about available complex care management services.  Follow Up Plan:  Additional outreach attempts will be made to offer the patient complex care management information and services.   Encounter Outcome:  No Answer  Thedford Franks, CCMA Care Coordination Care Guide Direct Dial: [phone]

## 2023-02-18 NOTE — Progress Notes (Signed)
 Complex Care Management Note  Care Guide Note 02/18/2023 Name: Rachel Donovan MRN: [id number] DOB: [birth year]  Rachel Donovan is a 78 y.o. year old female who sees Cable, Lynwood HERO, NP for primary care. I reached out to [employer] by phone today to offer complex care management services.  Ms. Mcfarlane was given information about Complex Care Management services today including:   The Complex Care Management services include support from the care team which includes your Nurse Coordinator, Clinical Social Worker, or Pharmacist.  The Complex Care Management team is here to help remove barriers to the health concerns and goals most important to you. Complex Care Management services are voluntary, and the patient may decline or stop services at any time by request to their care team member.   Complex Care Management Consent Status: Patient agreed to services and verbal consent obtained.   Follow up plan:  Telephone appointment with complex care management team member scheduled for:  02/26/2023  Encounter Outcome:  Patient Scheduled  Thedford Franks, [facility] Care Coordination Care Guide Direct Dial: [phone]

## 2023-02-24 ENCOUNTER — Ambulatory Visit (INDEPENDENT_AMBULATORY_CARE_PROVIDER_SITE_OTHER): Payer: Medicare Other | Admitting: Neurology

## 2023-02-24 ENCOUNTER — Encounter: Payer: Self-pay | Admitting: Neurology

## 2023-02-24 VITALS — BP 170/78 | HR 66 | Ht 61.0 in | Wt 116.0 lb

## 2023-02-24 DIAGNOSIS — F32A Depression, unspecified: Secondary | ICD-10-CM | POA: Diagnosis not present

## 2023-02-24 DIAGNOSIS — R55 Syncope and collapse: Secondary | ICD-10-CM | POA: Diagnosis not present

## 2023-02-24 DIAGNOSIS — F419 Anxiety disorder, unspecified: Secondary | ICD-10-CM | POA: Diagnosis not present

## 2023-02-24 NOTE — Progress Notes (Signed)
[facility]  PATIENT: Rachel Donovan DOB: [birth year]  REQUESTING CLINICIAN: Eden Emms, NP HISTORY FROM: Patient and husband  REASON FOR VISIT: Syncopal episode    HISTORICAL  CHIEF COMPLAINT:  Chief Complaint  Patient presents with   New Patient (Initial Visit)    Rm12, husband present,  Internal referral for syncope:fainted about a month ago, denied dizziness or feeling light headed. Husband tried to pick her up but she was completely dead weight he called ems and she was sent to er    HISTORY OF PRESENT ILLNESS:  This is 78 year old woman past medical history of anxiety/depression, hypertension, hypothyroidism, heart disease who is presenting after a syncopal episode.  Patient reports on January 5, she remembers standing at the sink, possibly felt a little dizziness then she woke up to firefighters around her, asking questions.  Husband told me that he heard a loud noise, when he went to check on patient, she was on the floor, she was conscious and she was able to answer appropriately.  Husband tells me that her body went limp, he could not help her off the floor therefore he called EMS.  EMS told patient that she was dehydrated and taken her to the hospital. Patient does not recall her interaction with her husband, only with the paramedics.   In the hospital, her initial workup including lab work and CT scan showed electrolyte abnormalities but no other abnormalities.  Her urine specific gravity was 1.020. Diagnosed with syncopal episode and discharged home.  Since discharge from the hospital, she has been doing well.  She did presented to her PCP who recommended her to increase her water intake and she has been doing well since. Patient reports about 2 years ago she did have another episode, again was standing in the kitchen and fell backward.  With that specific episode she was able to stand unassisted and she presented to the hospital, normal work up. Other than  that, she is compliant with her medications.  Her husband reports that patient has more symptoms of anxiety and depression which is affecting her daily routine, she is on Wellbutrin and BuSpar.  Her PCP has added lorazepam as needed.   OTHER MEDICAL CONDITIONS: Anxiety/Depression, Hypothyroidism, Hypertension.   REVIEW OF SYSTEMS: Full 14 system review of systems performed and negative with exception of: As noted in the HPI   ALLERGIES: Allergies  Allergen Reactions   Codeine Hives   Penicillins Hives   Sulfa Antibiotics Hives    HOME MEDICATIONS: Outpatient Medications Prior to Visit  Medication Sig Dispense Refill   amLODipine (NORVASC) 5 MG tablet Take 1 tablet (5 mg total) by mouth daily. 90 tablet 3   buPROPion (WELLBUTRIN XL) 150 MG 24 hr tablet 1 tablet in the morning Orally Once a day for 90 days     busPIRone (BUSPAR) 15 MG tablet Take 1 tablet (15 mg total) by mouth 2 (two) times daily. 180 tablet 1   Calcium Carbonate-Vit D-Min (CALCIUM 1200 PO) Take 1 tablet by mouth 2 (two) times daily. Takes Citracal     dipyridamole-aspirin (AGGRENOX) 200-25 MG 12hr capsule Take 1 capsule by mouth 2 (two) times daily.      Folic Acid-Vit B6-Vit B12 (FABB) 2.2-25-1 MG TABS Take 1 tablet by mouth daily.     levothyroxine (SYNTHROID, LEVOTHROID) 125 MCG tablet Take 125 mcg by mouth every other day.   2   levothyroxine (SYNTHROID, LEVOTHROID) 137 MCG tablet TK 1 T PO EVERY MORNING BEFORE  BREAKFAST ALTERNATING WITH T  1   LORazepam (ATIVAN) 1 MG tablet Take 1 tablet (1 mg total) by mouth daily as needed for anxiety. 30 tablet 1   losartan (COZAAR) 100 MG tablet losartan 100 mg tablet     metoprolol succinate (TOPROL-XL) 50 MG 24 hr tablet Take 1 tablet (50 mg total) by mouth daily. Need appointment 30 tablet 0   Multiple Vitamin (GNP ESSENTIAL ONE DAILY) TABS Take by mouth daily.      rosuvastatin (CRESTOR) 20 MG tablet Take 20 mg by mouth daily.     Facility-Administered Medications  Prior to Visit  Medication Dose Route Frequency Provider Last Rate Last Admin   0.9 %  sodium chloride infusion  500 mL Intravenous Once Tressia Danas, MD        PAST MEDICAL HISTORY: Past Medical History:  Diagnosis Date   Anxiety    Aortic atherosclerosis (HCC)    Arthritis    Benign neoplasm of colon, unspecified    Colon polyp    CVA (cerebral vascular accident) (HCC) 2006   Depression    High cholesterol    Hyperlipidemia    Hyperplastic colon polyp    Hypertension    Hypothyroid    surgical   IGT (impaired glucose tolerance)    Melanoma (HCC)    Migraines    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    OA (osteoarthritis)    PVD (peripheral vascular disease) (HCC)    Rectocele    Shingles     PAST SURGICAL HISTORY: Past Surgical History:  Procedure Laterality Date   ABDOMINAL HYSTERECTOMY     BREAST BIOPSY Right    X 2   BREAST BIOPSY Left    BREAST SURGERY     BX breast lumps X 3   CATARACT EXTRACTION, BILATERAL     COLONOSCOPY     PARTIAL HYSTERECTOMY     REPLACEMENT TOTAL KNEE Bilateral    RIGHT WRIST TENDON     THYROIDECTOMY      FAMILY HISTORY: Family History  Problem Relation Age of Onset   Stroke Mother    Hypertension Mother    Heart disease Mother    Arthritis Mother    Kidney disease Mother    Hypertension Father    Heart disease Father    Arthritis Father    Hypertension Brother    Heart attack Brother    Heart disease Brother    Arthritis Brother    Thyroid cancer Son     SOCIAL HISTORY: Social History   Socioeconomic History   Marital status: Married    Spouse name: Nadine Counts   Number of children: 4   Years of education: Not on file   Highest education level: Not on file  Occupational History   Not on file  Tobacco Use   Smoking status: Former    Current packs/day: 0.00    Average packs/day: 0.5 packs/day for 12.0 years (6.0 ttl pk-yrs)    Types: Cigarettes    Start date: 02/04/1966    Quit date: 02/04/1978     Years since quitting: 45.0   Smokeless tobacco: Never  Vaping Use   Vaping status: Never Used  Substance and Sexual Activity   Alcohol use: Yes    Comment: occassional beer with pizza, wine sometime   Drug use: No   Sexual activity: Not Currently    Partners: Male    Birth control/protection: Post-menopausal  Other Topics Concern   Not on file  Social History Narrative      1 bio and 3 bonus    Social Drivers of Community education officer: Not on file  Food Insecurity: Not on file  Transportation Needs: Not on file  Physical Activity: Not on file  Stress: Not on file  Social Connections: Not on file  Intimate Partner Violence: Not on file    PHYSICAL EXAM  GENERAL EXAM/CONSTITUTIONAL: Vitals:  Vitals:   02/24/23 1141  BP: (!) 170/78  Pulse: 66  Weight: 116 lb (52.6 kg)  Height: 5\' 1"  (1.549 m)   Body mass index is 21.92 kg/m. Wt Readings from Last 3 Encounters:  02/24/23 116 lb (52.6 kg)  02/12/23 114 lb 3.2 oz (51.8 kg)  02/09/23 113 lb (51.3 kg)   Patient is in no distress; well developed, nourished and groomed; neck is supple  MUSCULOSKELETAL: Gait, strength, tone, movements noted in Neurologic exam below  NEUROLOGIC: MENTAL STATUS:     10/04/2022   12:55 PM  MMSE - Mini Mental State Exam  Orientation to time 2  Orientation to Place 4  Registration 3  Attention/ Calculation 5  Recall 2  Language- name 2 objects 2  Language- repeat 1  Language- follow 3 step command 3  Language- read & follow direction 1  Write a sentence 1  Copy design 1  Total score 25   awake, alert, oriented to person, place and time recent and remote memory intact normal attention and concentration language fluent, comprehension intact, naming intact fund of knowledge appropriate  CRANIAL NERVE:  2nd, 3rd, 4th, 6th - Visual fields full to confrontation, extraocular muscles intact, no nystagmus 5th - facial sensation symmetric 7th - facial strength  symmetric 8th - hearing intact 9th - palate elevates symmetrically, uvula midline 11th - shoulder shrug symmetric 12th - tongue protrusion midline  MOTOR:  normal bulk and tone, full strength in the BUE, BLE  SENSORY:  normal and symmetric to light touch  COORDINATION:  finger-nose-finger, fine finger movements normal  GAIT/STATION:  normal   DIAGNOSTIC DATA (LABS, IMAGING, TESTING) - I reviewed patient records, labs, notes, testing and imaging myself where available.  Lab Results  Component Value Date   WBC 6.7 02/09/2023   HGB 14.7 02/09/2023   HCT 42.0 02/09/2023   MCV 93.5 02/09/2023   PLT 168 02/09/2023      Component Value Date/Time   NA 134 (L) 02/12/2023 1025   NA 134 03/19/2021 1038   K 3.9 02/12/2023 1025   CL 98 02/12/2023 1025   CO2 28 02/12/2023 1025   GLUCOSE 139 (H) 02/12/2023 1025   BUN 30 (H) 02/12/2023 1025   BUN 18 03/19/2021 1038   CREATININE 0.99 02/12/2023 1025   CREATININE 0.99 01/07/2022 1146   CALCIUM 9.6 02/12/2023 1025   PROT 6.7 10/04/2022 1311   ALBUMIN 4.6 10/04/2022 1311   AST 18 10/04/2022 1311   ALT 20 10/04/2022 1311   ALKPHOS 80 10/04/2022 1311   BILITOT 0.6 10/04/2022 1311   GFRNONAA 43 (L) 02/09/2023 2202   Lab Results  Component Value Date   CHOL 153 10/04/2022   HDL 71.60 10/04/2022   LDLCALC 66 10/04/2022   TRIG 79.0 10/04/2022   CHOLHDL 2 10/04/2022   No results found for: "HGBA1C" Lab Results  Component Value Date   VITAMINB12 1,182 (H) 10/04/2022   Lab Results  Component Value Date   TSH 0.407 02/09/2023    CT head 02/09/2023 1. No acute intracranial process. 2. Moderate chronic  small vessel ischemic changes.    ASSESSMENT AND PLAN  78 y.o. year old female with Anxiety/depression, hypertension, hypothyroidism, heart disease who is presenting after a syncopal episode.  This was at the end of the day while standing in the kitchen.  There was no abnormal movements noted, no loss of consciousness and no  episodes of confusion.  Patient noted to be dehydrated at that time.  Patient likely suffered from a vasovagal syncope from related to dehydration.  Plan for now is to increase her hydration, we also discussed adding electrolyte pack to her water.  She voiced understanding.   For her ongoing symptoms of anxiety and depression, she is remain on BuSpar and Wellbutrin, advised her to continue following up with her PCP for further management.  She voiced understanding. In terms of her driving, since this event was likely a syncopal episode and not an epileptic seizure, patient can resume driving.  We have agreed for her to wait an additional 10 days and she will restart driving in February.  Continue follow-up PCP return sooner if worse.   1. Syncope, unspecified syncope type   2. Anxiety   3. Depression, unspecified depression type      Patient Instructions  Increase fluid intake, can add electrolyte pack to your water Continue current medications  Continue to follow up with NP Cable for management of depressive symptoms  Return as needed  No orders of the defined types were placed in this encounter.   No orders of the defined types were placed in this encounter.   Return if symptoms worsen or fail to improve.    Windell Norfolk, MD 02/24/2023, 1:13 PM  [facility] [address] [place], [place] [address] [phone]

## 2023-02-24 NOTE — Patient Instructions (Addendum)
Increase fluid intake, can add electrolyte pack to your water Continue current medications  Continue to follow up with NP Cable for management of depressive symptoms  Return as needed

## 2023-02-26 ENCOUNTER — Encounter: Payer: Medicare Other | Admitting: *Deleted

## 2023-02-28 ENCOUNTER — Ambulatory Visit: Payer: Self-pay | Admitting: *Deleted

## 2023-02-28 NOTE — Patient Outreach (Signed)
  Care Coordination   Initial Visit Note   02/28/2023 Name: Rachel Donovan MRN: [id number] DOB: [birth year]  Rachel Donovan is a 78 y.o. year old female who sees Cable, Genene Churn, NP for primary care. I spoke with  Rachel Donovan by phone today.  What matters to the patients health and wellness today?  Patient denies having any community resource/mental health needs at this time.     Goals Addressed             This Visit's Progress    care coordination activities       Activities and task to complete in order to accomplish goals.   EMOTIONAL / MENTAL HEALTH SUPPORT Keep all upcoming appointment discussed today Continue with compliance of taking medication prescribed by Doctor Please contact this CSW with any additional community resource/mental health needs         SDOH assessments and interventions completed:  Yes  SDOH Interventions Today    Flowsheet Row Most Recent Value  SDOH Interventions   Food Insecurity Interventions Intervention Not Indicated  Housing Interventions Intervention Not Indicated  Transportation Interventions Intervention Not Indicated  Utilities Interventions Intervention Not Indicated        Care Coordination Interventions:  Yes, provided  Interventions Today    Flowsheet Row Most Recent Value  Chronic Disease   Chronic disease during today's visit --  [syncope & collapse, AKI,]  General Interventions   General Interventions Discussed/Reviewed General Interventions Discussed  [Assessed for community resource/mental health needs. Per patient, she was told she was dehydrated, now uses "liquid IV hydration multiplier daily-spouse transports patient to all appts MD recommended that she not drive for 1 year]  Mental Health Interventions   Mental Health Discussed/Reviewed Mental Health Discussed, Coping Strategies  [facility] needs discussed, patient reports mood have improved since discharge from the hospital. "I am better now that I know why I  passed out" confirms having  postive family/friend supports no addiitonal community resource needs at this time]  Pharmacy Interventions   Pharmacy Dicussed/Reviewed Pharmacy Topics Discussed  [Daughter in law fills patient's pill box bi-weekly]       Follow up plan: No further intervention required.   Encounter Outcome:  Patient Visit Completed

## 2023-02-28 NOTE — Patient Instructions (Signed)
Visit Information  Thank you for taking time to visit with me today. Please don't hesitate to contact me if I can be of assistance to you.   Following are the goals we discussed today:   Goals Addressed             This Visit's Progress    care coordination activities       Activities and task to complete in order to accomplish goals.   EMOTIONAL / MENTAL HEALTH SUPPORT Keep all upcoming appointment discussed today Continue with compliance of taking medication prescribed by Doctor Please contact this CSW with any additional community resource/mental health needs        If you are experiencing a Mental Health or Behavioral Health Crisis or need someone to talk to, please call 911   Patient verbalizes understanding of instructions and care plan provided today and agrees to view in MyChart. Active MyChart status and patient understanding of how to access instructions and care plan via MyChart confirmed with patient.     No further follow up required: Patient to contact this Child psychotherapist with any mental health/community resource needs  [employer], [employer] [facility], [facility] Licensed Clinical Social Geologist, engineering Dial: [phone]

## 2023-03-02 NOTE — Progress Notes (Unsigned)
Cardiology Office Note:    Date:  03/03/2023   ID:  Rachel Donovan, DOB 07-27-1945, MRN [id number]  PCP:  Eden Emms, NP  [facility] Providers Cardiologist:  Julien Nordmann, MD     Referring MD: Eden Emms, NP   No chief complaint on file.    History of Present Illness:    Rachel Donovan is a 78 y.o. female with a hx of history of CVA, hyperlipidemia and coronary artery calcifications.  She is previously been seen by Dr. Mariah Milling in 2020.  Hx of HLD , hypothyroidism   Coronary calcium score of 103. This was 74 percentile for age and sex matched control.  CVA in 2006.  Possibly a TIA  - is on Aggrenox Occasional episodes of CP behind both breasts Gets some exercise Cp only lasts for seconds  Goes to a class on mondays .   Exercise  does not bring on these cp   BP is a bit elevated.  Eats salt Takes furosemide on occasion Has some ankle edema at night .  Typically goes down bu morning   She admits that she eats too much salt Will add HCTZ 25 mg a day   July 23, 2021 Pt is seen for follow up of HTN, TIA, coronary artery calcifications  Is not feeling well.   BP is up  May be having some gall bladder issues  Has not been eating much recently   August 06, 2022 Rachel Donovan is seen for follow up of her HTN, TIA, Coronary artery calcification BP is elevated Has been a stressful week  No CP or dyspnea.   Has not been taking her hydrochlorothiazide or lasix ( due to hyponatremia )  BP has been a bit elevated.  Still eats some salt, not much     Jan. 27, 2025 Rachel Donovan is seen for follow up of her HTN, TIA, CAC She passed out on Jan. 5.  Was out for several minutes Husband called EMS.   Was at [facility] overnight  Was thought to be volume depleted.   Creatinine was elevated.   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   buPROPion (WELLBUTRIN XL) 150 MG 24 hr tablet 1 tablet in the morning Orally Once a day for 90 days   busPIRone (BUSPAR) 15 MG tablet Take 1 tablet (15 mg total) by mouth 2 (two) times daily.   Calcium Carbonate-Vit D-Min (CALCIUM 1200 PO) Take 1 tablet by mouth 2 (two) times daily. Takes Citracal   dipyridamole-aspirin (AGGRENOX) 200-25 MG 12hr capsule Take 1 capsule by mouth 2 (two) times daily.    Folic Acid-Vit B6-Vit B12 (FABB) 2.2-25-1 MG TABS Take 1 tablet by mouth daily.   levothyroxine (SYNTHROID, LEVOTHROID) 125 MCG tablet Take 125 mcg by mouth every other day.    levothyroxine (SYNTHROID, LEVOTHROID) 137 MCG tablet TK 1 T PO EVERY MORNING BEFORE BREAKFAST ALTERNATING WITH T   LORazepam (ATIVAN) 1 MG tablet Take 1 tablet (1 mg total) by mouth daily as needed for anxiety.   losartan (COZAAR) 100 MG tablet losartan 100 mg tablet   metoprolol succinate (TOPROL-XL) 50 MG 24 hr tablet Take 1 tablet (50 mg total) by mouth daily. Need appointment   Multiple Vitamin (GNP ESSENTIAL ONE DAILY) TABS Take by mouth daily.     rosuvastatin (CRESTOR) 20 MG tablet Take 20 mg by mouth daily.   Current Facility-Administered Medications for the 03/03/23 encounter (Office Visit) with Kymir Coles, Deloris Ping, MD  Medication   0.9 %  sodium chloride infusion     Allergies:   Codeine, Penicillins, and Sulfa antibiotics   Social History   Socioeconomic History   Marital status: Married    Spouse name: Nadine Counts   Number of children: 4   Years of education: Not on file   Highest education level: Not on file  Occupational History   Not on file  Tobacco Use   Smoking status: Former    Current packs/day: 0.00    Average packs/day: 0.5 packs/day for 12.0 years (6.0 ttl pk-yrs)    Types: Cigarettes    Start date: 02/04/1966    Quit date: 02/04/1978    Years since quitting: 45.1   Smokeless tobacco: Never  Vaping Use   Vaping status: Never Used  Substance and Sexual Activity   Alcohol use: Yes    Comment: occassional beer with pizza, wine sometime   Drug use: No   Sexual activity: Not Currently    Partners: Male    Birth control/protection: Post-menopausal    Comment: MARRIED  Other Topics Concern   Not on file  Social History Narrative      1 bio and 3 bonus    Social Drivers of Corporate investment banker Strain: Not on file  Food Insecurity: No Food Insecurity (02/28/2023)   Hunger Vital Sign    Worried About Running Out of Food in the Last Year: Never true    Ran Out of Food in the Last Year: Never true  Transportation Needs: No Transportation Needs (02/28/2023)   PRAPARE - Administrator, Civil Service (Medical): No    Lack of Transportation (Non-Medical): No  Physical Activity: Not on file  Stress: Not on file  Social Connections: Not on file    Family History: The patient's family history includes Arthritis in her brother, father, and mother; Heart attack in her brother; Heart disease in her brother, father, and mother; Hypertension in her brother, father, and mother; Kidney disease in her mother;  Stroke in her mother; Thyroid cancer in her son.  ROS:   Please see the history of present  illness.     All other systems reviewed and are negative.  EKGs/Labs/Other Studies Reviewed:    The following studies were reviewed today:  EKG:           Recent Labs: 10/04/2022: ALT 20 02/09/2023: Hemoglobin 14.7; Magnesium 2.3; Platelets 168; TSH 0.407 02/12/2023: BUN 30; Creatinine, Ser 0.99; Potassium 3.9; Sodium 134  Recent Lipid Panel    Component Value Date/Time   CHOL 153 10/04/2022 1311   TRIG 79.0 10/04/2022 1311   HDL 71.60 10/04/2022 1311   CHOLHDL 2 10/04/2022 1311   VLDL 15.8 10/04/2022 1311   LDLCALC 66 10/04/2022 1311     Risk Assessment/Calculations:     Physical Exam:     Physical Exam: Blood pressure 130/62, pulse (!) 56, height 5\' 1"  (1.549 m), weight 115 lb (52.2 kg), SpO2 98%.       GEN:  Well nourished, well developed in no acute distress HEENT: Normal NECK: No JVD; No carotid bruits LYMPHATICS: No lymphadenopathy CARDIAC: RRR , no murmurs, rubs, gallops RESPIRATORY:  Clear to auscultation without rales, wheezing or rhonchi  ABDOMEN: Soft, non-tender, non-distended MUSCULOSKELETAL:  No edema; No deformity  SKIN: Warm and dry NEUROLOGIC:  Alert and oriented x 3    ASSESSMENT:    1. Essential hypertension   2. Syncope and collapse       PLAN:       Hypertension: Blood pressure looks to be well-controlled.  2.  Hyperlipidemia: stable    3.  Syncope: She will had an episode of syncope.  She was out for several minutes.  Her husband called EMS and she was taken [facility].  They suspect that she was volume depleted.  She seems to be feeling better.  I would like to place a 14-day monitor to make sure that she is not having any significant arrhythmias.  Assuming that the monitor looks okay we will see her back in 1 year.       Medication Adjustments/Labs and Tests Ordered: Current medicines are reviewed at length with the patient today.   Concerns regarding medicines are outlined above.  No orders of the defined types were placed in this encounter.   No orders of the defined types were placed in this encounter.    There are no Patient Instructions on file for this visit.   Signed, Kristeen Miss, MD  03/03/2023 2:40 PM    [facility]

## 2023-03-03 ENCOUNTER — Ambulatory Visit: Payer: Medicare Other | Attending: Cardiovascular Disease | Admitting: Cardiovascular Disease

## 2023-03-03 ENCOUNTER — Encounter: Payer: Self-pay | Admitting: Cardiovascular Disease

## 2023-03-03 ENCOUNTER — Ambulatory Visit (INDEPENDENT_AMBULATORY_CARE_PROVIDER_SITE_OTHER): Payer: Medicare Other

## 2023-03-03 VITALS — BP 130/62 | HR 56 | Ht 61.0 in | Wt 115.0 lb

## 2023-03-03 DIAGNOSIS — I10 Essential (primary) hypertension: Secondary | ICD-10-CM | POA: Diagnosis not present

## 2023-03-03 DIAGNOSIS — R55 Syncope and collapse: Secondary | ICD-10-CM

## 2023-03-03 NOTE — Patient Instructions (Signed)
Testing/Procedures: 14-day zio monitor Your physician has recommended that you wear an event monitor. Event monitors are medical devices that record the heart's electrical activity. Doctors most often [place] these monitors to diagnose arrhythmias. Arrhythmias are problems with the speed or rhythm of the heartbeat. The monitor is a small, portable device. You can wear one while you do your normal daily activities. This is usually used to diagnose what is causing palpitations/syncope (passing out).  Follow-Up: At [facility], you and your health needs are our priority.  As part of our continuing mission to provide you with exceptional heart care, we have created designated Provider Care Teams.  These Care Teams include your primary Cardiologist (physician) and Advanced Practice Providers (APPs -  Physician Assistants and Nurse Practitioners) who all work together to provide you with the care you need, when you need it.  Your next appointment:   1 year(s)  Provider:   Kristeen Miss, MD   1st Floor: - Lobby - Registration  - Pharmacy  - Lab - Cafe  2nd Floor: - PV Lab - Diagnostic Testing (echo, CT, nuclear med)  3rd Floor: - Vacant  4th Floor: - TCTS (cardiothoracic surgery) - AFib Clinic - Structural Heart Clinic - Vascular Surgery  - Vascular Ultrasound  5th Floor: - HeartCare Cardiology (general and EP) - Clinical Pharmacy for coumadin, hypertension, lipid, weight-loss medications, and med management appointments    Valet parking services will be available as well.

## 2023-03-03 NOTE — Progress Notes (Unsigned)
Zio serial # [id number] from office inventory applied to patient.

## 2023-03-19 ENCOUNTER — Telehealth: Payer: Self-pay | Admitting: Cardiovascular Disease

## 2023-03-19 NOTE — Telephone Encounter (Signed)
Pt said that she took her heart monitor off on 03/09/23 instead of 2/14 and just realized what she did. Requesting cb

## 2023-03-20 ENCOUNTER — Encounter: Payer: Self-pay | Admitting: Cardiovascular Disease

## 2023-03-20 NOTE — Telephone Encounter (Signed)
Called and spoke with patient who states she has already spoke with Dr Elease Hashimoto. She doesn't have any other issues or questions at this time.

## 2023-04-01 ENCOUNTER — Ambulatory Visit: Payer: Medicare Other | Admitting: Nurse Practitioner

## 2023-04-07 ENCOUNTER — Ambulatory Visit (INDEPENDENT_AMBULATORY_CARE_PROVIDER_SITE_OTHER): Payer: Medicare Other | Admitting: Nurse Practitioner

## 2023-04-07 VITALS — BP 120/70 | HR 61 | Temp 97.6°F | Ht 61.0 in | Wt 115.6 lb

## 2023-04-07 DIAGNOSIS — E039 Hypothyroidism, unspecified: Secondary | ICD-10-CM

## 2023-04-07 DIAGNOSIS — F325 Major depressive disorder, single episode, in full remission: Secondary | ICD-10-CM

## 2023-04-07 DIAGNOSIS — Z8673 Personal history of transient ischemic attack (TIA), and cerebral infarction without residual deficits: Secondary | ICD-10-CM

## 2023-04-07 DIAGNOSIS — I10 Essential (primary) hypertension: Secondary | ICD-10-CM | POA: Diagnosis not present

## 2023-04-07 DIAGNOSIS — I491 Atrial premature depolarization: Secondary | ICD-10-CM | POA: Diagnosis not present

## 2023-04-07 DIAGNOSIS — F411 Generalized anxiety disorder: Secondary | ICD-10-CM

## 2023-04-07 DIAGNOSIS — E782 Mixed hyperlipidemia: Secondary | ICD-10-CM

## 2023-04-07 MED ORDER — METOPROLOL SUCCINATE ER 50 MG PO TB24: 50.0000 mg | ORAL_TABLET | Freq: Every day | ORAL | 1 refills | Status: DC

## 2023-04-07 MED ORDER — ROSUVASTATIN CALCIUM 20 MG PO TABS: 20.0000 mg | ORAL_TABLET | Freq: Every day | ORAL | 1 refills | Status: AC

## 2023-04-07 MED ORDER — LOSARTAN POTASSIUM 100 MG PO TABS: 100.0000 mg | ORAL_TABLET | Freq: Every day | ORAL | 2 refills | Status: DC

## 2023-04-07 MED ORDER — LEVOTHYROXINE SODIUM 137 MCG PO TABS: 137.0000 ug | ORAL_TABLET | ORAL | 1 refills | Status: DC

## 2023-04-07 MED ORDER — BUPROPION HCL ER (XL) 150 MG PO TB24: 150.0000 mg | ORAL_TABLET | Freq: Every day | ORAL | 1 refills | Status: DC

## 2023-04-07 MED ORDER — LEVOTHYROXINE SODIUM 125 MCG PO TABS: 125.0000 ug | ORAL_TABLET | ORAL | 1 refills | Status: DC

## 2023-04-07 MED ORDER — ASPIRIN-DIPYRIDAMOLE ER 25-200 MG PO CP12: 1.0000 | ORAL_CAPSULE | Freq: Two times a day (BID) | ORAL | 1 refills | Status: DC

## 2023-04-07 NOTE — Patient Instructions (Signed)
 Nice to see you today I want to see you in 6 months for your physical and labs, sooner if you need me I have sent in your refills to the pharmacy

## 2023-04-07 NOTE — Progress Notes (Signed)
 Established Patient Office Visit  Subjective   Patient ID: Rachel Donovan, female    DOB: 1945-07-28  Age: 78 y.o. MRN: [id number]  Chief Complaint  Patient presents with   Follow-up    Pt states she has been doing pretty good. No complaints.   Medication Refill    Wellbutrin, levothyroxine 137mg , Aggrenox, metoprolol ER 50mg , [place] One (folic acid) Losartan, Rosuvastatin. Pt would like all medications from previous PCP switched.     HPI   MDD/GAD: patient is currenlty maintained on wellbutrion 150mg  daily and buspar 15mg  BID. Her buspar was increased at last office visit. She was stating that her spouse was starting to have hallucinations and that he was scheduled to under go a MRI. This caused an increase in patient anxiety. State that she is feeling better but she is still dealing with her husband. States that he got into the neurologist and the MRI found no acute findings.  Patient's daughter-in-law states that patient's been out of Wellbutrin for approximately 1 week and question whether he should continue it or not.  She also took a holiday from the atorvastatin and that helped with her memory she was instructed to go ahead and start crestor at that office visit.    Patient is accompanied by her daughter-in-law.  Patient states she is doing well overall.  Still dealing with some of her husband's hallucinations.  Daughter-in-law was questioning whether the husband is hallucinating or the patient is hallucinating that the husband is hallucinating.  As the daughter-in-law has not been present when either this has happened.  Patient and patient's spouse live together and live alone with heavy family involvement   Review of Systems  Constitutional:  Negative for chills and fever.  Respiratory:  Negative for shortness of breath.   Cardiovascular:  Negative for chest pain.  Neurological:  Negative for dizziness, loss of consciousness and headaches.  Psychiatric/Behavioral:  Negative for  hallucinations and suicidal ideas. The patient does not have insomnia.       Objective:     BP 120/70   Pulse 61   Temp 97.6 F (36.4 C) (Oral)   Ht 5\' 1"  (1.549 m)   Wt 115 lb 9.6 oz (52.4 kg)   SpO2 96%   BMI 21.84 kg/m  BP Readings from Last 3 Encounters:  04/07/23 120/70  03/03/23 130/62  02/24/23 (!) 170/78   Wt Readings from Last 3 Encounters:  04/07/23 115 lb 9.6 oz (52.4 kg)  03/03/23 115 lb (52.2 kg)  02/24/23 116 lb (52.6 kg)   SpO2 Readings from Last 3 Encounters:  04/07/23 96%  03/03/23 98%  02/12/23 98%      Physical Exam Vitals and nursing note reviewed.  Constitutional:      Appearance: Normal appearance.  Cardiovascular:     Rate and Rhythm: Normal rate and regular rhythm.     Heart sounds: Normal heart sounds.  Pulmonary:     Effort: Pulmonary effort is normal.     Breath sounds: Normal breath sounds.  Neurological:     Mental Status: She is alert.      No results found for any visits on 04/07/23.    The ASCVD Risk score (Arnett DK, et al., 2019) failed to calculate for the following reasons:   Risk score cannot be calculated because patient has a medical history suggesting prior/existing ASCVD    Assessment & Plan:   Problem List Items Addressed This Visit       Cardiovascular and Mediastinum  Essential hypertension   Refill medications as requested      Relevant Medications   dipyridamole-aspirin (AGGRENOX) 200-25 MG 12hr capsule   losartan (COZAAR) 100 MG tablet   rosuvastatin (CRESTOR) 20 MG tablet   metoprolol succinate (TOPROL-XL) 50 MG 24 hr tablet     Endocrine   Hypothyroidism   Refilled medications as requested      Relevant Medications   levothyroxine (SYNTHROID) 125 MCG tablet   levothyroxine (SYNTHROID) 137 MCG tablet   metoprolol succinate (TOPROL-XL) 50 MG 24 hr tablet     Other   History of CVA (cerebrovascular accident)   Relevant Medications   dipyridamole-aspirin (AGGRENOX) 200-25 MG 12hr  capsule   rosuvastatin (CRESTOR) 20 MG tablet   Mixed hyperlipidemia   Medication refill as requested      Relevant Medications   losartan (COZAAR) 100 MG tablet   rosuvastatin (CRESTOR) 20 MG tablet   metoprolol succinate (TOPROL-XL) 50 MG 24 hr tablet   Major depression, single episode - Primary   Patient currently maintained Wellbutrin 150 mg daily and buspirone 15 mg twice daily.  Continue medication as prescribed.  Patient denies HI/SI/AVH      Relevant Medications   buPROPion (WELLBUTRIN XL) 150 MG 24 hr tablet   GAD (generalized anxiety disorder)   Patient currently maintained on bupropion 150 mg daily and buspirone 15 mg twice daily.  Continue both medications prescribed.  Patient denies HI/SI/AVH.      Relevant Medications   buPROPion (WELLBUTRIN XL) 150 MG 24 hr tablet   Other Visit Diagnoses       PAC (premature atrial contraction)       Relevant Medications   losartan (COZAAR) 100 MG tablet   rosuvastatin (CRESTOR) 20 MG tablet   metoprolol succinate (TOPROL-XL) 50 MG 24 hr tablet       Return in about 6 months (around 10/08/2023) for "CPE" and labs .    Audria Nine, NP

## 2023-04-07 NOTE — Assessment & Plan Note (Signed)
 Patient currently maintained on bupropion 150 mg daily and buspirone 15 mg twice daily.  Continue both medications prescribed.  Patient denies HI/SI/AVH.

## 2023-04-07 NOTE — Assessment & Plan Note (Signed)
Refill medications as requested.

## 2023-04-07 NOTE — Assessment & Plan Note (Signed)
 Refilled medications as requested

## 2023-04-07 NOTE — Assessment & Plan Note (Signed)
 Patient currently maintained Wellbutrin 150 mg daily and buspirone 15 mg twice daily.  Continue medication as prescribed.  Patient denies HI/SI/AVH

## 2023-04-07 NOTE — Assessment & Plan Note (Signed)
Medication refill as requested. 

## 2023-04-17 ENCOUNTER — Ambulatory Visit (INDEPENDENT_AMBULATORY_CARE_PROVIDER_SITE_OTHER): Admitting: Podiatry

## 2023-04-17 ENCOUNTER — Encounter: Payer: Self-pay | Admitting: Podiatry

## 2023-04-17 VITALS — Ht 61.0 in | Wt 115.6 lb

## 2023-04-17 DIAGNOSIS — G609 Hereditary and idiopathic neuropathy, unspecified: Secondary | ICD-10-CM | POA: Diagnosis not present

## 2023-04-17 DIAGNOSIS — M79674 Pain in right toe(s): Secondary | ICD-10-CM | POA: Diagnosis not present

## 2023-04-17 DIAGNOSIS — M79675 Pain in left toe(s): Secondary | ICD-10-CM | POA: Diagnosis not present

## 2023-04-17 DIAGNOSIS — Q828 Other specified congenital malformations of skin: Secondary | ICD-10-CM

## 2023-04-17 DIAGNOSIS — B351 Tinea unguium: Secondary | ICD-10-CM | POA: Diagnosis not present

## 2023-04-17 DIAGNOSIS — I739 Peripheral vascular disease, unspecified: Secondary | ICD-10-CM | POA: Diagnosis not present

## 2023-04-17 NOTE — Progress Notes (Signed)
  Subjective:  Patient ID: Rachel Donovan, female    DOB: [birth year],  MRN: [id number]  78 y.o. female presents at risk foot care. Patient has h/o PAD and neuropathy and painful porokeratotic lesion(s) left foot and painful mycotic toenails that limit ambulation. Painful toenails interfere with ambulation. Aggravating factors include wearing enclosed shoe gear. Pain is relieved with periodic professional debridement. Painful porokeratotic lesions are aggravated when weightbearing with and without shoegear. Pain is relieved with periodic professional debridement. Her husband is present during today's visit. Chief Complaint  Patient presents with   Nail Problem    Pt is here for [facility] PCP is Dr Toney Reil and LOV was 2 weeks ago.   New problem(s): None   PCP is Eden Emms, NP.  Allergies  Allergen Reactions   Codeine Hives   Sulfa Antibiotics Hives   Penicillins Hives, Other (See Comments) and Rash    Review of Systems: Negative except as noted in the HPI.   Objective:  SHAYNE DEERMAN is a pleasant 78 y.o. female thin build in NAD. AAO x 3.  Vascular Examination: Vascular status intact b/l with palpable pedal pulses. CFT immediate b/l. Pedal hair present. No edema. No pain with calf compression b/l. Skin temperature gradient WNL b/l. No varicosities noted. No cyanosis or clubbing noted.  Dermatological Examination: Pedal skin is warm and supple b/l LE. No open wounds b/l LE. No interdigital macerations noted b/l LE. Toenails left great toe, right great toe, and R 2nd toe well maintained with adequate length. No erythema, no edema, no drainage, no fluctuance. Porokeratotic lesion(s) submet head 2 left foot. No erythema, no edema, no drainage, no fluctuance..  Neurological Examination: Pt has subjective symptoms of neuropathy. Protective sensation intact with 10 gram monofilament b/l LE. Vibratory sensation intact b/l LE.   Musculoskeletal Examination: Muscle strength 5/5 to all lower  extremity muscle groups bilaterally. Hammertoe(s) noted to the left 2-4.  Radiographs: None  Last A1c:       No data to display          Assessment:   1. Pain due to onychomycosis of toenails of both feet   2. Porokeratosis   3. PAD (peripheral artery disease) (HCC)   4. Idiopathic peripheral neuropathy    Plan:  Consent given for treatment. Patient examined. All patient's and/or POA's questions/concerns addressed on today's visit. Toenails 1-5 debrided in length and girth without incident. Porokeratotic lesion(s) submet head 2 left foot pared and enucleated with sharp debridement without incident. Continue soft, supportive shoe gear daily. Report any pedal injuries to medical professional. Call office if there are any questions/concerns. -Patient/POA to call should there be question/concern in the interim.  Return in about 3 months (around 07/18/2023).  Freddie Breech, DPM      [place] LOCATION: [address] [place] [address]                   Office [phone]   [facility] LOCATION: [address] [place], [place] [address] Office [phone]

## 2023-04-25 ENCOUNTER — Ambulatory Visit: Payer: Medicare Other | Admitting: Neurology

## 2023-04-30 ENCOUNTER — Ambulatory Visit (INDEPENDENT_AMBULATORY_CARE_PROVIDER_SITE_OTHER)

## 2023-04-30 VITALS — BP 106/60 | Ht 61.0 in | Wt 116.2 lb

## 2023-04-30 DIAGNOSIS — Z0000 Encounter for general adult medical examination without abnormal findings: Secondary | ICD-10-CM

## 2023-04-30 NOTE — Patient Instructions (Addendum)
 Ms. Geraci , Thank you for taking time to come for your Medicare Wellness Visit. I appreciate your ongoing commitment to your health goals. Please review the following plan we discussed and let me know if I can assist you in the future.   Referrals/Orders/Follow-Ups/Clinician Recommendations: none  This is a list of the screening recommended for you and due dates:  Health Maintenance  Topic Date Due   Hepatitis C Screening  Never done   Zoster (Shingles) Vaccine (2 of 2) 11/13/2017   COVID-19 Vaccine (3 - Pfizer risk series) 02/12/2024*   Medicare Annual Wellness Visit  04/29/2024   DTaP/Tdap/Td vaccine (2 - Td or Tdap) 01/11/2027   Pneumonia Vaccine  Completed   Flu Shot  Completed   DEXA scan (bone density measurement)  Completed   HPV Vaccine  Aged Out   Colon Cancer Screening  Discontinued  *Topic was postponed. The date shown is not the original due date.    Advanced directives: (Declined) Advance directive discussed with you today. Even though you declined this today, please call our office should you change your mind, and we can give you the proper paperwork for you to fill out. Completing now  Next Medicare Annual Wellness Visit scheduled for next year: Yes 04/30/24 @ 1pm televisit

## 2023-04-30 NOTE — Progress Notes (Signed)
 Subjective:   Rachel Donovan is a 78 y.o. who presents for a Medicare Wellness preventive visit.  Visit Complete: In person  Persons Participating in Visit: Patient.  AWV Questionnaire: No: Patient Medicare AWV questionnaire was not completed prior to this visit.  Cardiac Risk Factors include: advanced age (>51men, >40 women);dyslipidemia;hypertension    Objective:    Today's Vitals   04/30/23 1252  Weight: 116 lb 3.2 oz (52.7 kg)  Height: 5\' 1"  (1.549 m)   Body mass index is 21.96 kg/m.     04/30/2023    1:31 PM 02/09/2023    9:49 PM 02/14/2021   11:09 AM 09/13/2020    7:42 AM  Advanced Directives  Does Patient Have a Medical Advance Directive? No No Yes Yes  Type of Theme park manager;Living will  Does patient want to make changes to medical advance directive?   No - Patient declined No - Patient declined  Would patient like information on creating a medical advance directive?  No - Patient declined      Current Medications (verified) Outpatient Encounter Medications as of 04/30/2023  Medication Sig   amLODipine (NORVASC) 5 MG tablet Take 1 tablet (5 mg total) by mouth daily.   azithromycin (ZITHROMAX) 250 MG tablet Take by mouth.   buPROPion (WELLBUTRIN XL) 150 MG 24 hr tablet Take 1 tablet (150 mg total) by mouth daily.   busPIRone (BUSPAR) 15 MG tablet Take 1 tablet (15 mg total) by mouth 2 (two) times daily.   Calcium Carbonate-Vit D-Min (CALCIUM 1200 PO) Take 1 tablet by mouth 2 (two) times daily. Takes Citracal   dipyridamole-aspirin (AGGRENOX) 200-25 MG 12hr capsule Take 1 capsule by mouth 2 (two) times daily.   Folic Acid-Vit B6-Vit B12 (FABB) 2.2-25-1 MG TABS Take 1 tablet by mouth daily.   levothyroxine (SYNTHROID) 125 MCG tablet Take 1 tablet (125 mcg total) by mouth every other day.   levothyroxine (SYNTHROID) 137 MCG tablet Take 1 tablet (137 mcg total) by mouth every other day.   LORazepam (ATIVAN) 1 MG tablet Take 1 tablet (1  mg total) by mouth daily as needed for anxiety.   losartan (COZAAR) 100 MG tablet Take 1 tablet (100 mg total) by mouth daily.   metoprolol succinate (TOPROL-XL) 50 MG 24 hr tablet Take 1 tablet (50 mg total) by mouth daily. Need appointment   Multiple Vitamin (GNP ESSENTIAL ONE DAILY) TABS Take by mouth daily.    rosuvastatin (CRESTOR) 20 MG tablet Take 1 tablet (20 mg total) by mouth daily.   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    OA (osteoarthritis)    PVD (peripheral vascular disease) (HCC)    Rectocele    Shingles    Past Surgical History:  Procedure Laterality Date  ABDOMINAL HYSTERECTOMY     BREAST BIOPSY Right    X 2   BREAST BIOPSY Left    BREAST SURGERY     BX breast lumps X 3   CATARACT EXTRACTION, BILATERAL     COLONOSCOPY     PARTIAL HYSTERECTOMY     REPLACEMENT TOTAL KNEE Bilateral    RIGHT WRIST TENDON     THYROIDECTOMY     Family History  Problem Relation Age of Onset   Stroke Mother    Hypertension Mother    Heart disease Mother    Arthritis Mother    Kidney disease Mother    Hypertension Father    Heart disease Father    Arthritis Father    Hypertension Brother    Heart attack Brother    Heart disease Brother    Arthritis Brother    Thyroid cancer Son    Social History   Socioeconomic History   Marital status: Married    Spouse name: Nadine Counts   Number  of children: 4   Years of education: Not on file   Highest education level: 12th grade  Occupational History   Not on file  Tobacco Use   Smoking status: Former    Current packs/day: 0.00    Average packs/day: 0.5 packs/day for 12.0 years (6.0 ttl pk-yrs)    Types: Cigarettes    Start date: 02/04/1966    Quit date: 02/04/1978    Years since quitting: 45.2   Smokeless tobacco: Never  Vaping Use   Vaping status: Never Used  Substance and Sexual Activity   Alcohol use: Yes    Comment: occassional beer with pizza, wine sometime   Drug use: No   Sexual activity: Not Currently    Partners: Male    Birth control/protection: Post-menopausal    Comment: MARRIED  Other Topics Concern   Not on file  Social History Narrative      1 bio and 3 bonus    Social Drivers of Corporate investment banker Strain: Low Risk  (04/30/2023)   Overall Financial Resource Strain (CARDIA)    Difficulty of Paying Living Expenses: Not hard at all  Food Insecurity: No Food Insecurity (04/30/2023)   Hunger Vital Sign    Worried About Running Out of Food in the Last Year: Never true    Ran Out of Food in the Last Year: Never true  Transportation Needs: No Transportation Needs (04/30/2023)   PRAPARE - Administrator, Civil Service (Medical): No    Lack of Transportation (Non-Medical): No  Physical Activity: Insufficiently Active (04/30/2023)   Exercise Vital Sign    Days of Exercise per Week: 3 days    Minutes of Exercise per Session: 30 min  Stress: Stress Concern Present (04/30/2023)   [employer] of Occupational Health - Occupational Stress Questionnaire    Feeling of Stress : To some extent  Social Connections: Socially Integrated (04/30/2023)   Social Connection and Isolation Panel [NHANES]    Frequency of Communication with Friends and Family: More than three times a week    Frequency of Social Gatherings with Friends and Family: More than three times a week    Attends Religious  Services: More than 4 times per year    Active Member of [employer] or Organizations: Yes    Attends Engineer, structural: More than 4 times per year    Marital Status: Married    Tobacco Counseling Counseling given: Not Answered  Clinical Intake:  Pre-visit  preparation completed: Yes  Pain : No/denies pain    BMI - recorded: 21.96 Nutritional Status: BMI of 19-24  Normal Nutritional Risks: Nausea/ vomitting/ diarrhea (vomits if takes medication on empty stomach) Diabetes: No  No results found for: "HGBA1C"   How often do you need to have someone help you when you read instructions, pamphlets, or other written materials from your doctor or pharmacy?: 1 - Never  Interpreter Needed?: No  Comments: lives with husband Information entered by :: B.Tarryn Bogdan,LPN  Activities of Daily Living     04/30/2023    1:32 PM  In your present state of health, do you have any difficulty performing the following activities:  Hearing? 0  Vision? 0  Difficulty concentrating or making decisions? 1  Walking or climbing stairs? 0  Dressing or bathing? 0  Doing errands, shopping? 0  Preparing Food and eating ? N  Using the Toilet? N  In the past six months, have you accidently leaked urine? Y  Do you have problems with loss of bowel control? N  Managing your Medications? N  Managing your Finances? N  Housekeeping or managing your Housekeeping? N    Patient Care Team: Eden Emms, NP as PCP - General (Nurse Practitioner) Antonieta Iba, MD as PCP - Cardiology (Cardiology) [place], [place], [place] as [facility] Care Management  Indicate any recent Medical Services you may have received from other than [facility] providers in the past year (date may be approximate).     Assessment:   This is a routine wellness examination for Rachel Donovan.  Hearing/Vision screen Hearing Screening - Comments:: Pt says her hearing is good:hearing aids doe snot work Vision Screening - Comments:: Pt says her vision  is good;has readers Dr Sinda Du   Goals Addressed               This Visit's Progress     care coordination activities   On track     Activities and task to complete in order to accomplish goals.   EMOTIONAL / MENTAL HEALTH SUPPORT Keep all upcoming appointment discussed today Continue with compliance of taking medication prescribed by Doctor Please contact this CSW with any additional community resource/mental health needs       Patient Stated (pt-stated)        Stay alive and keep moving       Depression Screen     04/30/2023    1:24 PM 04/07/2023    2:12 PM 02/28/2023    1:15 PM 02/12/2023   10:56 AM 12/31/2022    9:58 AM 11/01/2022   11:18 AM  PHQ 2/9 Scores  PHQ - 2 Score 0 0 0 0 0 0  PHQ- 9 Score  4  1 1 1     Fall Risk     04/30/2023    1:13 PM 04/07/2023    2:19 PM 02/28/2023    1:10 PM 02/12/2023   10:57 AM 11/01/2022   11:22 AM  Fall Risk   Falls in the past year? 1 1 1 1 1   Comment passed out at home      Number falls in past yr: 0 0 0 0 0  Injury with Fall? 0 0 0 0 0  Risk for fall due to : No Fall Risks Other (Comment);No Fall Risks  Other (Comment) No Fall Risks  Follow up Falls prevention discussed;Education provided Falls evaluation completed  Falls evaluation completed Falls evaluation completed    MEDICARE RISK AT HOME:  Medicare Risk  at Home Any stairs in or around the home?: Yes If so, are there any without handrails?: Yes Home free of loose throw rugs in walkways, pet beds, electrical cords, etc?: Yes Adequate lighting in your home to reduce risk of falls?: Yes Life alert?: No Use of a cane, walker or w/c?: No Grab bars in the bathroom?: Yes Shower chair or bench in shower?: No Elevated toilet seat or a handicapped toilet?: No  TIMED UP AND GO:  Was the test performed?  Yes  Length of time to ambulate 10 feet: 10 sec Gait slow and steady without use of assistive device  Cognitive Function: 6CIT completed    10/04/2022   12:55 PM   MMSE - Mini Mental State Exam  Orientation to time 2  Orientation to Place 4  Registration 3  Attention/ Calculation 5  Recall 2  Language- name 2 objects 2  Language- repeat 1  Language- follow 3 step command 3  Language- read & follow direction 1  Write a sentence 1  Copy design 1  Total score 25        04/30/2023    1:33 PM  6CIT Screen  What Year? 0 points  What month? 0 points  What time? 0 points  Count back from 20 0 points  Months in reverse 0 points  Repeat phrase 0 points  Total Score 0 points    Immunizations Immunization History  Administered Date(s) Administered   Fluad Trivalent(High Dose 65+) 10/23/2022   Influenza, High Dose Seasonal PF 11/30/2012, 11/04/2014, 10/06/2015, 11/09/2016, 12/11/2017   Influenza, Quadrivalent, Recombinant, Inj, Pf 10/17/2018, 11/06/2019   Influenza,inj,quad, With Preservative 09/05/2015   PFIZER(Purple Top)SARS-COV-2 Vaccination 03/12/2019, 04/06/2019   Pneumococcal Conjugate-13 06/11/2013, 04/16/2017   Pneumococcal Polysaccharide-23 11/19/2010   Pneumococcal-Unspecified 02/04/2013   Tdap 01/10/2017   Zoster Recombinant(Shingrix) 09/18/2017    Screening Tests Health Maintenance  Topic Date Due   Hepatitis C Screening  Never done   Zoster Vaccines- Shingrix (2 of 2) 11/13/2017   COVID-19 Vaccine (3 - Pfizer risk series) 02/12/2024 (Originally 05/04/2019)   Medicare Annual Wellness (AWV)  04/29/2024   DTaP/Tdap/Td (2 - Td or Tdap) 01/11/2027   Pneumonia Vaccine 12+ Years old  Completed   INFLUENZA VACCINE  Completed   DEXA SCAN  Completed   HPV VACCINES  Aged Out   Colonoscopy  Discontinued    Health Maintenance  Health Maintenance Due  Topic Date Due   Hepatitis C Screening  Never done   Zoster Vaccines- Shingrix (2 of 2) 11/13/2017   Health Maintenance Items Addressed:  Additional Screening:  Vision Screening: Recommended annual ophthalmology exams for early detection of glaucoma and other disorders of  the eye.  Dental Screening: Recommended annual dental exams for proper oral hygiene  Community Resource Referral / Chronic Care Management: CRR required this visit?  No   CCM required this visit?  No    Plan:     I have personally reviewed and noted the following in the patient's chart:   Medical and social history Use of alcohol, tobacco or illicit drugs  Current medications and supplements including opioid prescriptions. Patient is not currently taking opioid prescriptions. Functional ability and status Nutritional status Physical activity Advanced directives List of other physicians Hospitalizations, surgeries, and ER visits in previous 12 months Vitals Screenings to include cognitive, depression, and falls Referrals and appointments  In addition, I have reviewed and discussed with patient certain preventive protocols, quality metrics, and best practice recommendations. A written personalized care plan  for preventive services as well as general preventive health recommendations were provided to patient.    Sue Lush, LPN   1/61/0960   After Visit Summary: (MyChart) Due to this being a telephonic visit, the after visit summary with patients personalized plan was offered to patient via MyChart   Notes: Nothing significant to report at this time.

## 2023-05-14 ENCOUNTER — Ambulatory Visit: Admitting: Nurse Practitioner

## 2023-05-14 ENCOUNTER — Ambulatory Visit: Payer: Self-pay

## 2023-05-14 VITALS — BP 152/88 | HR 63 | Temp 97.6°F | Ht 61.0 in | Wt 116.0 lb

## 2023-05-14 DIAGNOSIS — R7989 Other specified abnormal findings of blood chemistry: Secondary | ICD-10-CM | POA: Diagnosis not present

## 2023-05-14 DIAGNOSIS — E039 Hypothyroidism, unspecified: Secondary | ICD-10-CM

## 2023-05-14 DIAGNOSIS — I10 Essential (primary) hypertension: Secondary | ICD-10-CM

## 2023-05-14 LAB — COMPREHENSIVE METABOLIC PANEL WITH GFR
ALT: 21 U/L (ref 0–35)
AST: 19 U/L (ref 0–37)
Albumin: 4.7 g/dL (ref 3.5–5.2)
Alkaline Phosphatase: 76 U/L (ref 39–117)
BUN: 25 mg/dL — ABNORMAL HIGH (ref 6–23)
CO2: 29 meq/L (ref 19–32)
Calcium: 9.5 mg/dL (ref 8.4–10.5)
Chloride: 98 meq/L (ref 96–112)
Creatinine, Ser: 0.79 mg/dL (ref 0.40–1.20)
GFR: 71.77 mL/min (ref >60.00)
Glucose, Bld: 91 mg/dL (ref 70–99)
Potassium: 4 meq/L (ref 3.5–5.1)
Sodium: 133 meq/L — ABNORMAL LOW (ref 135–145)
Total Bilirubin: 0.6 mg/dL (ref 0.2–1.2)
Total Protein: 6.6 g/dL (ref 6.0–8.3)

## 2023-05-14 LAB — CBC
HCT: 41.1 % (ref 36.0–46.0)
Hemoglobin: 14.2 g/dL (ref 12.0–15.0)
MCHC: 34.5 g/dL (ref 30.0–36.0)
MCV: 95.8 fl (ref 78.0–100.0)
Platelets: 188 10*3/uL (ref 150.0–400.0)
RBC: 4.29 Mil/uL (ref 3.87–5.11)
RDW: 12.8 % (ref 11.5–15.5)
WBC: 4.6 10*3/uL (ref 4.0–10.5)

## 2023-05-14 LAB — TSH: TSH: 0.1 u[IU]/mL — ABNORMAL LOW (ref 0.35–5.50)

## 2023-05-14 NOTE — Telephone Encounter (Signed)
 Noted. Blood pressure much better

## 2023-05-14 NOTE — Patient Instructions (Addendum)
 Nice to see you today  I think taking a lorazepam and then checking blood pressure again in 1 hour and call to let me know what the reading is.  I will be in touch with the labs once I have the results Follow up as needed/ scheduled

## 2023-05-14 NOTE — Telephone Encounter (Signed)
 Copied From CRM [id number]. Pt was seen this morning and was instructed to relay Blood pressure numbers to doctor as they are 137/78, and 139/81. Transferring Pink work for medical advice.    Reason for Disposition  [1] Other NON-URGENT information for PCP AND [2] does not require PCP response  Answer Assessment - Initial Assessment Questions 1. REASON FOR CALL or QUESTION: "What is your reason for calling today?" or "How can I best help you?" or "What question do you have that I can help answer?"     Patient calling in with BP readings after taking her lorazepam. 137/78 amd 139/81. asymptomatic  2. CALLER: Document the source of call. (e.g., laboratory, patient).     patient  Protocols used: PCP Call - No Triage-A-AH

## 2023-05-14 NOTE — Progress Notes (Signed)
 Acute Office Visit  Subjective:     Patient ID: Rachel Donovan, female    DOB: 03-18-1945, 78 y.o.   MRN: [id number]  Chief Complaint  Patient presents with   Hypertension    Pt complains of high BP today. 175/140. Pt states that she feels okay but is tired.Checked BP a few times and then Pt took BP medication this morning. Checked bp 30 minutes after and BP was still high.      Patient is in today for elevated blood pressure with a history of CVA, HTN, migraine, hypothyroidism   She is maintained on metoprolol 50mg  and amlodipine 5mg , and losartan 100mg  States that when she got up she went to check her blood pressure. State that her anxiety was up and then saw it was elevated. States that she checked several more times and then took her meds. It was still evleated. Takes her medications at around 8am this morning. She does have 2 blood pressure cuffs and checked her numbers with both and they both read high.  Stats that they are going to have family coming over and she got concerend about where they are staying. State that she has been running on "high gear". No other changes in regards to medications or life changes or stressors   Review of Systems  Constitutional:  Negative for chills and fever.  Eyes:  Negative for blurred vision and double vision.  Respiratory:  Negative for shortness of breath.   Cardiovascular:  Negative for chest pain.  Neurological:  Negative for dizziness and headaches.        Objective:    BP (!) 152/88   Pulse 63   Temp 97.6 F (36.4 C) (Oral)   Ht 5\' 1"  (1.549 m)   Wt 116 lb (52.6 kg)   SpO2 98%   BMI 21.92 kg/m  BP Readings from Last 3 Encounters:  05/14/23 (!) 152/88  04/30/23 106/60  04/07/23 120/70   Wt Readings from Last 3 Encounters:  05/14/23 116 lb (52.6 kg)  04/30/23 116 lb 3.2 oz (52.7 kg)  04/17/23 115 lb 9.6 oz (52.4 kg)   SpO2 Readings from Last 3 Encounters:  05/14/23 98%  04/07/23 96%  03/03/23 98%      Physical  Exam Vitals and nursing note reviewed.  Constitutional:      Appearance: Normal appearance.  Eyes:     Conjunctiva/sclera: Conjunctivae normal.     Pupils: Pupils are equal, round, and reactive to light.  Cardiovascular:     Rate and Rhythm: Normal rate and regular rhythm.     Heart sounds: Normal heart sounds.  Pulmonary:     Effort: Pulmonary effort is normal.     Breath sounds: Normal breath sounds.  Lymphadenopathy:     Cervical: No cervical adenopathy.  Neurological:     General: No focal deficit present.     Mental Status: She is alert.     Cranial Nerves: Cranial nerves 2-12 are intact.     Motor: Motor function is intact.     Coordination: Coordination is intact.     Deep Tendon Reflexes:     Reflex Scores:      Bicep reflexes are 2+ on the right side and 2+ on the left side.      Patellar reflexes are 2+ on the right side and 2+ on the left side.    Comments: Bilateral upper and lower extremity strength 5/5     No results found for any visits on  05/14/23.      Assessment & Plan:   Problem List Items Addressed This Visit       Cardiovascular and Mediastinum   Essential hypertension - Primary   Patient is currently maintained on losartan 100mg , amlodipine 5mg , and metoprolol 50mg  daily. Traditionally bp well controlled. She will go home and try to relax. She can take a lorazepam and then recheck her blood pressure if she is still getting really high readings she can take a second amlodipine 5mg .  Pending blood work       Relevant Orders   CBC   Comprehensive metabolic panel with GFR   TSH    No orders of the defined types were placed in this encounter.   Return if symptoms worsen or fail to improve, for as scheduled .  Audria Nine, NP

## 2023-05-14 NOTE — Assessment & Plan Note (Signed)
 Patient is currently maintained on losartan 100mg , amlodipine 5mg , and metoprolol 50mg  daily. Traditionally bp well controlled. She will go home and try to relax. She can take a lorazepam and then recheck her blood pressure if she is still getting really high readings she can take a second amlodipine 5mg .  Pending blood work

## 2023-05-15 ENCOUNTER — Ambulatory Visit (INDEPENDENT_AMBULATORY_CARE_PROVIDER_SITE_OTHER)

## 2023-05-15 DIAGNOSIS — I10 Essential (primary) hypertension: Secondary | ICD-10-CM

## 2023-05-15 DIAGNOSIS — E039 Hypothyroidism, unspecified: Secondary | ICD-10-CM

## 2023-05-15 DIAGNOSIS — R7989 Other specified abnormal findings of blood chemistry: Secondary | ICD-10-CM

## 2023-05-15 LAB — T3, FREE: T3, Free: 3 pg/mL (ref 2.3–4.2)

## 2023-05-15 LAB — T4, FREE: Free T4: 1.67 ng/dL — ABNORMAL HIGH (ref 0.60–1.60)

## 2023-05-15 NOTE — Addendum Note (Signed)
 Addended by: Alvina Chou on: 05/15/2023 10:23 AM   Modules accepted: Orders

## 2023-05-16 ENCOUNTER — Other Ambulatory Visit: Payer: Self-pay | Admitting: Nurse Practitioner

## 2023-05-16 ENCOUNTER — Encounter: Payer: Self-pay | Admitting: Nurse Practitioner

## 2023-05-16 DIAGNOSIS — E039 Hypothyroidism, unspecified: Secondary | ICD-10-CM

## 2023-05-16 MED ORDER — LEVOTHYROXINE SODIUM 125 MCG PO TABS: 125.0000 ug | ORAL_TABLET | Freq: Every day | ORAL | 0 refills | Status: DC

## 2023-05-22 ENCOUNTER — Other Ambulatory Visit: Payer: Self-pay | Admitting: Nurse Practitioner

## 2023-05-22 DIAGNOSIS — F411 Generalized anxiety disorder: Secondary | ICD-10-CM

## 2023-05-22 NOTE — Telephone Encounter (Signed)
 Copied from CRM [id number]. Topic: Clinical - Medication Refill >> May 22, 2023 12:25 PM Marlan Silva wrote: Most Recent Primary Care Visit:  Provider: Dorothe Gaster  Department: [facility]  Visit Type: ACUTE  Date: 05/14/2023  Medication: LORazepam (ATIVAN) 1 MG tablet  Has the patient contacted their pharmacy? Yes (Agent: If no, request that the patient contact the pharmacy for the refill. If patient does not wish to contact the pharmacy document the reason why and proceed with request.) (Agent: If yes, when and what did the pharmacy advise?)  Is this the correct pharmacy for this prescription? Yes If no, delete pharmacy and type the correct one.  This is the patient's preferred pharmacy:  [id number] - [place], [place] - [address] AT [facility] [address] [facility] [address] [place] [address] Phone: [phone] Fax: [phone]   Has the prescription been filled recently? No  Is the patient out of the medication? No  Has the patient been seen for an appointment in the last year OR does the patient have an upcoming appointment? Yes  Can we respond through MyChart? Yes  Agent: Please be advised that Rx refills may take up to 3 business days. We ask that you follow-up with your pharmacy.

## 2023-05-23 ENCOUNTER — Other Ambulatory Visit: Payer: Self-pay | Admitting: Nurse Practitioner

## 2023-05-23 DIAGNOSIS — F411 Generalized anxiety disorder: Secondary | ICD-10-CM

## 2023-05-23 NOTE — Telephone Encounter (Signed)
 Copied from CRM [id number]. Topic: Clinical - Medication Refill >> May 22, 2023 12:25 PM Marlan Silva wrote: Most Recent Primary Care Visit:  Provider: Dorothe Gaster  Department: [facility]  Visit Type: ACUTE  Date: 05/14/2023  Medication: LORazepam  (ATIVAN ) 1 MG tablet  Has the patient contacted their pharmacy? Yes (Agent: If no, request that the patient contact the pharmacy for the refill. If patient does not wish to contact the pharmacy document the reason why and proceed with request.) (Agent: If yes, when and what did the pharmacy advise?)  Is this the correct pharmacy for this prescription? Yes If no, delete pharmacy and type the correct one.  This is the patient's preferred pharmacy:  [id number] - [place], [place] - [address] AT [facility] OF [address] [facility] [address] [place] [address] Phone: [phone] Fax: [phone]   Has the prescription been filled recently? No  Is the patient out of the medication? No  Has the patient been seen for an appointment in the last year OR does the patient have an upcoming appointment? Yes  Can we respond through MyChart? Yes  Agent: Please be advised that Rx refills may take up to 3 business days. We ask that you follow-up with your pharmacy. >> May 23, 2023 12:20 PM Terence Fend E wrote: Patient returning call, called CAL no answer.  Please call patient at  [phone]  ok to leave a detailed message

## 2023-05-26 ENCOUNTER — Other Ambulatory Visit: Payer: Self-pay | Admitting: Nurse Practitioner

## 2023-05-26 DIAGNOSIS — F411 Generalized anxiety disorder: Secondary | ICD-10-CM

## 2023-05-26 MED ORDER — LORAZEPAM 1 MG PO TABS: 1.0000 mg | ORAL_TABLET | Freq: Every day | ORAL | 1 refills | Status: DC | PRN

## 2023-07-01 ENCOUNTER — Telehealth: Payer: Self-pay | Admitting: Nurse Practitioner

## 2023-07-01 ENCOUNTER — Other Ambulatory Visit (INDEPENDENT_AMBULATORY_CARE_PROVIDER_SITE_OTHER)

## 2023-07-01 DIAGNOSIS — E039 Hypothyroidism, unspecified: Secondary | ICD-10-CM

## 2023-07-01 LAB — TSH: TSH: 0.14 u[IU]/mL — ABNORMAL LOW (ref 0.35–5.50)

## 2023-07-01 LAB — T4, FREE: Free T4: 1.66 ng/dL — ABNORMAL HIGH (ref 0.60–1.60)

## 2023-07-01 NOTE — Telephone Encounter (Signed)
 Here for lab work but requesting refill on [place] one tablets that are filled at [facility] [phone] [facility] on [address]. Previous prescriber Dr. Jesse Moritz.

## 2023-07-01 NOTE — Telephone Encounter (Signed)
 Can we call and get clarification what medications she needs refills [facility] please

## 2023-07-01 NOTE — Telephone Encounter (Signed)
 Left detailed voicemail for patient to call the office back with name of prescriptions that need refills.

## 2023-07-03 ENCOUNTER — Ambulatory Visit: Payer: Self-pay | Admitting: Nurse Practitioner

## 2023-07-03 ENCOUNTER — Other Ambulatory Visit: Payer: Self-pay | Admitting: Nurse Practitioner

## 2023-07-03 ENCOUNTER — Ambulatory Visit: Payer: Self-pay

## 2023-07-03 DIAGNOSIS — F411 Generalized anxiety disorder: Secondary | ICD-10-CM

## 2023-07-03 DIAGNOSIS — E039 Hypothyroidism, unspecified: Secondary | ICD-10-CM

## 2023-07-03 MED ORDER — BUSPIRONE HCL 15 MG PO TABS: 15.0000 mg | ORAL_TABLET | Freq: Two times a day (BID) | ORAL | 1 refills | Status: DC

## 2023-07-03 MED ORDER — LEVOTHYROXINE SODIUM 112 MCG PO TABS: 112.0000 ug | ORAL_TABLET | Freq: Every day | ORAL | 3 refills | Status: DC

## 2023-07-03 MED ORDER — FABB 2.2-25-1 MG PO TABS: 1.0000 | ORAL_TABLET | Freq: Every day | ORAL | 2 refills | Status: DC

## 2023-07-03 NOTE — Addendum Note (Signed)
 Addended by: Dorothe Gaster on: 07/03/2023 01:04 PM   Modules accepted: Orders

## 2023-07-03 NOTE — Telephone Encounter (Signed)
  Chief Complaint: medication refills.   Disposition: [] ED /[] Urgent Care (no appt availability in office) / [] Appointment(In office/virtual)/ []  [facility]/ [] Home Care/ [] Refused Recommended Disposition /[] [facility]/ [x]  Follow-up with PCP Additional Notes: Her daughter in law states that she was needing a couple medications refilled that were still under the pt's old pcp's names. Needs a refill on FABB and buspar  [facility] [address].  Pt also states that she is wondering about cognitive testing for the pt.   Copied from CRM [id number]. Topic: Clinical - Medication Question >> Jul 03, 2023  9:59 AM Stanly Early wrote: Reason for CRM: [name] daughter in law has a question about medication [id number] Reason for Disposition  [1] Caller requesting a prescription renewal (no refills left), no triage required, AND [2] triager able to renew prescription per department policy  Protocols used: Medication Refill and Renewal Call-A-AH

## 2023-07-03 NOTE — Telephone Encounter (Signed)
 FABB is Folic Acid-Vit B6-Vit B12 (FABB) 2.2-25-1 MG TABS

## 2023-07-03 NOTE — Telephone Encounter (Signed)
 Sent mychart message to pt regarding refills.

## 2023-07-03 NOTE — Telephone Encounter (Signed)
 Not sure what FABB is as a medication. She has been referred to neurology. She can ask them about cognitive testing

## 2023-07-03 NOTE — Addendum Note (Signed)
 Addended by: Dorothe Gaster on: 07/03/2023 04:30 PM   Modules accepted: Orders

## 2023-07-03 NOTE — Telephone Encounter (Unsigned)
 Copied from CRM [id number]. Topic: Clinical - Medication Refill >> Jul 03, 2023  9:54 AM Stanly Early wrote: Medication: busPIRone  (BUSPAR ) 15 MG tablet westab 1MG  tablet  Has the patient contacted their pharmacy? No (Agent: If no, request that the patient contact the pharmacy for the refill. If patient does not wish to contact the pharmacy document the reason why and proceed with request.) (Agent: If yes, when and what did the pharmacy advise?)  This is the patient's preferred pharmacy:  [id number] - [place], [place] - [address] AT [facility] [address] [facility] [address] [place] [address] Phone: [phone] Fax: [phone]  Is this the correct pharmacy for this prescription? Yes If no, delete pharmacy and type the correct one.   Has the prescription been filled recently? No  Is the patient out of the medication? No 2 weeks left  Has the patient been seen for an appointment in the last year OR does the patient have an upcoming appointment? Yes  Can we respond through MyChart? Yes  Agent: Please be advised that Rx refills may take up to 3 business days. We ask that you follow-up with your pharmacy.

## 2023-07-07 ENCOUNTER — Other Ambulatory Visit: Payer: Self-pay

## 2023-07-07 ENCOUNTER — Other Ambulatory Visit: Payer: Self-pay | Admitting: Nurse Practitioner

## 2023-07-07 DIAGNOSIS — F325 Major depressive disorder, single episode, in full remission: Secondary | ICD-10-CM

## 2023-07-07 DIAGNOSIS — E039 Hypothyroidism, unspecified: Secondary | ICD-10-CM

## 2023-07-07 DIAGNOSIS — F411 Generalized anxiety disorder: Secondary | ICD-10-CM

## 2023-07-07 NOTE — Telephone Encounter (Signed)
 Copied from CRM [id number]. Topic: Clinical - Medication Question >> Jul 03, 2023  9:59 AM Stanly Early wrote: Reason for CRM: [name] daughter in law has a question about medication [phone] >> Jul 07, 2023  3:53 PM Rosamond Comes wrote: Patient daughter in law [name] [phone] wants Clem Currier T CMA  to call her back regarding the medications refills.  Patient is leaving for vacation on 07/14/23 [name] would like to get this resolved

## 2023-07-08 MED ORDER — BUSPIRONE HCL 15 MG PO TABS
15.0000 mg | ORAL_TABLET | Freq: Two times a day (BID) | ORAL | 1 refills | Status: DC
Start: 2023-07-08 — End: 2023-07-16

## 2023-07-08 MED ORDER — FABB 2.2-25-1 MG PO TABS: 1.0000 | ORAL_TABLET | Freq: Every day | ORAL | 2 refills | Status: DC

## 2023-07-08 MED ORDER — LEVOTHYROXINE SODIUM 112 MCG PO TABS: 112.0000 ug | ORAL_TABLET | Freq: Every day | ORAL | 3 refills | Status: AC

## 2023-07-08 NOTE — Telephone Encounter (Signed)
 [name], NP attempted to send refills on 07/03/23 but they were listed as phoned in.  Resent prescriptions.

## 2023-07-08 NOTE — Telephone Encounter (Signed)
 Copied from CRM [id number]. Topic: General - Other >> Jul 07, 2023  4:13 PM Melissa C wrote: Reason for CRM: patient has been playing phone tag with Janae and was wondering if she could give her a call back when she has a chance. >> Jul 08, 2023 10:49 AM Kita Perish H wrote: [name] returning call to [employer], states that patient needs a refill on the busPIRone  (BUSPAR ) 15 MG tablet and Folic Acid-Vit B6-Vit B12 (FABB) 2.2-25-1 MG TABS/West tab 1, states pharmacy is waiting on approval.  [name] [phone]

## 2023-07-08 NOTE — Telephone Encounter (Signed)
 Left voicemail for patient or Rachel Donovan (daughter in law on dpr) to call the office back.

## 2023-07-08 NOTE — Telephone Encounter (Signed)
 Returned Rachel Donovan's call to inform her of prescriptions sent in.  [name] has some concerns regarding patients health.  Complains of memory and state of mind worsening.  [name] Would like to discuss future treatment plans with matt privately.

## 2023-07-08 NOTE — Addendum Note (Signed)
 Addended by: Philippa Bray on: 07/08/2023 02:49 PM   Modules accepted: Orders

## 2023-07-15 NOTE — Telephone Encounter (Signed)
 Can we get more information on her concerns?

## 2023-07-16 NOTE — Telephone Encounter (Signed)
 FABB and Buspar  have not been sent in. They keep processing as phone in   Alvord also wants to know if pt needs to take FABB because she is taking centrum multivitamin already.

## 2023-07-16 NOTE — Addendum Note (Signed)
 Addended by: Doretha Ganja on: 07/16/2023 03:01 PM   Modules accepted: Orders

## 2023-07-16 NOTE — Telephone Encounter (Signed)
 Rachel Donovan on [place]. Regarding information on Memory.   Rachel Donovan complains that pt's symptoms are increasing.  States of short term memory issues; not remembering the days or times, forgets to take medication, slowly losing clarity, forgets names of people she has known for years. Rachel Donovan mentioned that they are all on vacation.  Pt and her husband went in their own vehicle and got lost during travel.   Rachel Donovan states that the family wants to know what to expect and if these symptoms will worsen.  Pt is not completely in denial but still does not understand.  Rachel Donovan requests to have any information regarding the patients memory directly relayed to her because the pt is active on mychart

## 2023-07-17 MED ORDER — BUSPIRONE HCL 15 MG PO TABS: 15.0000 mg | ORAL_TABLET | Freq: Two times a day (BID) | ORAL | 1 refills | Status: DC

## 2023-07-17 NOTE — Telephone Encounter (Signed)
 Buspar  resent. If she is doing the multivitamin then she does not need to do FABB

## 2023-07-17 NOTE — Addendum Note (Signed)
 Addended by: Dorothe Gaster on: 07/17/2023 12:30 PM   Modules accepted: Orders

## 2023-08-18 ENCOUNTER — Other Ambulatory Visit: Payer: Self-pay | Admitting: Nurse Practitioner

## 2023-08-18 ENCOUNTER — Ambulatory Visit (INDEPENDENT_AMBULATORY_CARE_PROVIDER_SITE_OTHER): Admitting: Podiatry

## 2023-08-18 ENCOUNTER — Encounter: Payer: Self-pay | Admitting: Podiatry

## 2023-08-18 ENCOUNTER — Other Ambulatory Visit: Payer: Self-pay | Admitting: Cardiovascular Disease

## 2023-08-18 DIAGNOSIS — B351 Tinea unguium: Secondary | ICD-10-CM

## 2023-08-18 DIAGNOSIS — I739 Peripheral vascular disease, unspecified: Secondary | ICD-10-CM

## 2023-08-18 DIAGNOSIS — Q828 Other specified congenital malformations of skin: Secondary | ICD-10-CM

## 2023-08-18 DIAGNOSIS — G609 Hereditary and idiopathic neuropathy, unspecified: Secondary | ICD-10-CM | POA: Diagnosis not present

## 2023-08-18 DIAGNOSIS — M79674 Pain in right toe(s): Secondary | ICD-10-CM | POA: Diagnosis not present

## 2023-08-18 DIAGNOSIS — M79675 Pain in left toe(s): Secondary | ICD-10-CM

## 2023-08-18 DIAGNOSIS — F411 Generalized anxiety disorder: Secondary | ICD-10-CM

## 2023-08-18 NOTE — Telephone Encounter (Signed)
 Name of Medication: Ativan   Name of Pharmacy: [facility]  Last Fill or Written Date and Quantity:  05/26/23 #30 1 rf  Last Office Visit and Type: o/v 05/14/23 Next Office Visit and Type:  10/14/23 Last Controlled Substance Agreement Date: n/a  Last UDS:n/a

## 2023-08-24 NOTE — Progress Notes (Signed)
  Subjective:  Patient ID: Rachel Donovan, female    DOB: Nov 18, 1945,  MRN: [id number]  Rachel Donovan presents to clinic today for at risk foot care. Patient has h/o PAD and neuropathy and painful porokeratotic lesion(s) left lower extremity and painful mycotic toenails that limit ambulation. Painful toenails interfere with ambulation. Aggravating factors include wearing enclosed shoe gear. Pain is relieved with periodic professional debridement. Painful porokeratotic lesions are aggravated when weightbearing with and without shoegear. Pain is relieved with periodic professional debridement.  Chief Complaint  Patient presents with   Nail Problem    Thick painful toenails, 4 month follow up   New problem(s): None.   PCP is Wendee Lynwood HERO, NP.  Allergies  Allergen Reactions   Codeine Hives   Sulfa Antibiotics Hives   Penicillins Hives, Other (See Comments) and Rash    Review of Systems: Negative except as noted in the HPI.  Objective: No changes noted in today's physical examination. There were no vitals filed for this visit. Rachel Donovan is a pleasant 78 y.o. female thin build in NAD. AAO x 3.  Vascular Examination: Capillary refill time <3 seconds b/l LE. Palpable pedal pulses b/l LE. Digital hair present b/l. Skin temperature gradient WNL b/l. No varicosities b/l. Trace edema noted left ankle and right ankle..  Dermatological Examination: Pedal skin with normal turgor, texture and tone b/l. No open wounds. No interdigital macerations b/l. Toenails b/l great toes and right 2nd toe thickened, discolored, dystrophic with subungual debris. There is pain on palpation to dorsal aspect of nailplates. Porokeratotic lesion(s) submet head 2 left foot. No erythema, no edema, no drainage, no fluctuance.  Neurological Examination: Pt has subjective symptoms of neuropathy. Protective sensation intact with 10 gram monofilament b/l LE. Vibratory sensation intact b/l LE.   Musculoskeletal  Examination: Muscle strength 5/5 to all lower extremity muscle groups bilaterally. Hammertoe(s) noted to the left second digit, left third digit, and left fourth digit.  Assessment/Plan: 1. Pain due to onychomycosis of toenails of both feet   2. Porokeratosis   3. PAD (peripheral artery disease) (HCC)   4. Idiopathic peripheral neuropathy   Patient was evaluated and treated. All patient's and/or POA's questions/concerns addressed on today's visit. Mycotic toenails b/l great toes and right 2nd digit debrided in length and girth without incident. Porokeratotic lesion(s) submet head 2 left foot pared with sharp debridement without incident. Continue soft, supportive shoe gear daily. Report any pedal injuries to medical professional. Call office if there are any questions/concerns. -Patient/POA to call should there be question/concern in the interim.   Return in about 9 weeks (around 10/20/2023).  Rachel Donovan, DPM      [facility] LOCATION: [address], [place] [address]                   Office [phone]   [facility] LOCATION: [address] [place], [place] [address] Office [phone]

## 2023-09-09 ENCOUNTER — Other Ambulatory Visit (INDEPENDENT_AMBULATORY_CARE_PROVIDER_SITE_OTHER)

## 2023-09-09 DIAGNOSIS — E039 Hypothyroidism, unspecified: Secondary | ICD-10-CM

## 2023-09-09 LAB — T4, FREE: Free T4: 1.55 ng/dL (ref 0.60–1.60)

## 2023-09-09 LAB — TSH: TSH: 0.43 u[IU]/mL (ref 0.35–5.50)

## 2023-09-11 ENCOUNTER — Ambulatory Visit: Payer: Self-pay | Admitting: Nurse Practitioner

## 2023-09-23 ENCOUNTER — Telehealth: Payer: Self-pay | Admitting: Nurse Practitioner

## 2023-09-23 NOTE — Telephone Encounter (Signed)
 Called tracey (daughter in law) on pt dpr.  Informed tracey that patient needs to stay on Synthroid  112 MCG tablet daily.  Tracey verbalized understanding and states that she will pick up refill from pharmacy. No further questions or concerns.

## 2023-09-23 NOTE — Telephone Encounter (Signed)
 Copied from CRM [id number]. Topic: Clinical - Medication Question >> Sep 23, 2023 11:01 AM Donna BRAVO wrote: Reason for CRM: Randine daughter in law. Asking if patient should stay levothyroxine  (SYNTHROID ) 112 MCG tablet  before refilling, due to labs work on 09/09/23   Please call Randine with information phone [phone]  Patient will be taking a trip at the end of September  Patient was made aware their call will be returned in 1 business day.

## 2023-09-25 ENCOUNTER — Other Ambulatory Visit: Payer: Self-pay | Admitting: Nurse Practitioner

## 2023-09-25 DIAGNOSIS — I491 Atrial premature depolarization: Secondary | ICD-10-CM

## 2023-09-25 DIAGNOSIS — F325 Major depressive disorder, single episode, in full remission: Secondary | ICD-10-CM

## 2023-09-25 DIAGNOSIS — F411 Generalized anxiety disorder: Secondary | ICD-10-CM

## 2023-09-25 DIAGNOSIS — I10 Essential (primary) hypertension: Secondary | ICD-10-CM

## 2023-10-08 ENCOUNTER — Telehealth: Payer: Self-pay | Admitting: Nurse Practitioner

## 2023-10-08 NOTE — Telephone Encounter (Signed)
 Spouse dropped note for provider placed in box at front desk.

## 2023-10-08 NOTE — Telephone Encounter (Signed)
 If they would like evaluation they would need to be seen and then likely referred to neurology     10/04/2022   12:55 PM  MMSE - Mini Mental State Exam  Orientation to time 2  Orientation to Place 4  Registration 3  Attention/ Calculation 5  Recall 2  Language- name 2 objects 2  Language- repeat 1  Language- follow 3 step command 3  Language- read & follow direction 1  Write a sentence 1  Copy design 1  Total score 25

## 2023-10-14 ENCOUNTER — Ambulatory Visit: Admitting: Nurse Practitioner

## 2023-10-14 ENCOUNTER — Encounter: Payer: Self-pay | Admitting: Nurse Practitioner

## 2023-10-14 ENCOUNTER — Telehealth: Payer: Self-pay | Admitting: Nurse Practitioner

## 2023-10-14 VITALS — BP 138/78 | HR 55 | Temp 97.5°F | Ht 61.0 in | Wt 114.8 lb

## 2023-10-14 DIAGNOSIS — R413 Other amnesia: Secondary | ICD-10-CM

## 2023-10-14 DIAGNOSIS — Z8673 Personal history of transient ischemic attack (TIA), and cerebral infarction without residual deficits: Secondary | ICD-10-CM

## 2023-10-14 DIAGNOSIS — F419 Anxiety disorder, unspecified: Secondary | ICD-10-CM

## 2023-10-14 DIAGNOSIS — Z87891 Personal history of nicotine dependence: Secondary | ICD-10-CM

## 2023-10-14 DIAGNOSIS — E782 Mixed hyperlipidemia: Secondary | ICD-10-CM

## 2023-10-14 DIAGNOSIS — E890 Postprocedural hypothyroidism: Secondary | ICD-10-CM | POA: Diagnosis not present

## 2023-10-14 DIAGNOSIS — Z23 Encounter for immunization: Secondary | ICD-10-CM | POA: Diagnosis not present

## 2023-10-14 DIAGNOSIS — I10 Essential (primary) hypertension: Secondary | ICD-10-CM | POA: Diagnosis not present

## 2023-10-14 DIAGNOSIS — Z1231 Encounter for screening mammogram for malignant neoplasm of breast: Secondary | ICD-10-CM

## 2023-10-14 DIAGNOSIS — Z1159 Encounter for screening for other viral diseases: Secondary | ICD-10-CM

## 2023-10-14 DIAGNOSIS — M810 Age-related osteoporosis without current pathological fracture: Secondary | ICD-10-CM | POA: Diagnosis not present

## 2023-10-14 LAB — COMPREHENSIVE METABOLIC PANEL WITH GFR
ALT: 16 U/L (ref 0–35)
AST: 14 U/L (ref 0–37)
Albumin: 4.5 g/dL (ref 3.5–5.2)
Alkaline Phosphatase: 73 U/L (ref 39–117)
BUN: 21 mg/dL (ref 6–23)
CO2: 31 meq/L (ref 19–32)
Calcium: 9.9 mg/dL (ref 8.4–10.5)
Chloride: 97 meq/L (ref 96–112)
Creatinine, Ser: 0.83 mg/dL (ref 0.40–1.20)
GFR: 67.44 mL/min (ref >60.00)
Glucose, Bld: 119 mg/dL — ABNORMAL HIGH (ref 70–99)
Potassium: 4.1 meq/L (ref 3.5–5.1)
Sodium: 135 meq/L (ref 135–145)
Total Bilirubin: 0.8 mg/dL (ref 0.2–1.2)
Total Protein: 6.6 g/dL (ref 6.0–8.3)

## 2023-10-14 LAB — CBC WITH DIFFERENTIAL/PLATELET
Basophils Absolute: 0 K/uL (ref 0.0–0.1)
Basophils Relative: 0.7 % (ref 0.0–3.0)
Eosinophils Absolute: 0.1 K/uL (ref 0.0–0.7)
Eosinophils Relative: 1.6 % (ref 0.0–5.0)
HCT: 41.3 % (ref 36.0–46.0)
Hemoglobin: 14.3 g/dL (ref 12.0–15.0)
Lymphocytes Relative: 15.2 % (ref 12.0–46.0)
Lymphs Abs: 0.8 K/uL (ref 0.7–4.0)
MCHC: 34.5 g/dL (ref 30.0–36.0)
MCV: 94.1 fl (ref 78.0–100.0)
Monocytes Absolute: 0.5 K/uL (ref 0.1–1.0)
Monocytes Relative: 9.5 % (ref 3.0–12.0)
Neutro Abs: 3.7 K/uL (ref 1.4–7.7)
Neutrophils Relative %: 73 % (ref 43.0–77.0)
Platelets: 176 K/uL (ref 150.0–400.0)
RBC: 4.39 Mil/uL (ref 3.87–5.11)
RDW: 13.2 % (ref 11.5–15.5)
WBC: 5.1 K/uL (ref 4.0–10.5)

## 2023-10-14 LAB — VITAMIN B12: Vitamin B-12: 448 pg/mL (ref 211–911)

## 2023-10-14 LAB — URINALYSIS, MICROSCOPIC ONLY

## 2023-10-14 LAB — TSH: TSH: 0.6 u[IU]/mL (ref 0.35–5.50)

## 2023-10-14 LAB — LIPID PANEL
Cholesterol: 137 mg/dL (ref 0–200)
HDL: 72.8 mg/dL (ref >39.00)
LDL Cholesterol: 48 mg/dL (ref 0–99)
NonHDL: 64.67
Total CHOL/HDL Ratio: 2
Triglycerides: 81 mg/dL (ref 0.0–149.0)
VLDL: 16.2 mg/dL (ref 0.0–40.0)

## 2023-10-14 LAB — VITAMIN D 25 HYDROXY (VIT D DEFICIENCY, FRACTURES): VITD: 63.71 ng/mL (ref 30.00–100.00)

## 2023-10-14 NOTE — Assessment & Plan Note (Signed)
 History of the same.  Patient currently maintained on rosuvastatin  20 mg daily.  Pending lipid panel

## 2023-10-14 NOTE — Assessment & Plan Note (Signed)
 Distant history of smoking pending urine microscopy rule out microscopic hematuria

## 2023-10-14 NOTE — Assessment & Plan Note (Signed)
 History of the same.  MMSE 25 last year.  MMSE 27 this year.  Having some memory that she thinks is normal for her age.  Concern for some behavioral lability with memory impairment.  She is followed by neurology send a message to see if they can manage that or if this requires a new referral.  Also consider switching patient from Wellbutrin  to sertraline .  She was on Lexapro in the past but experienced profound hyponatremia

## 2023-10-14 NOTE — Assessment & Plan Note (Signed)
 Patient currently maintained on amlodipine  5 mg, losartan  100 mg, metoprolol  50 mg daily.  Followed by cardiology.  Patient's blood pressures were labile at home but good readings in office we will continue medication as prescribed.  Follow-up with cardiology as recommended

## 2023-10-14 NOTE — Assessment & Plan Note (Signed)
 Patient currently maintained on levothyroxine  112 mcg daily.  Pending thyroid  functions today

## 2023-10-14 NOTE — Assessment & Plan Note (Signed)
 Incidental finding on MRI.  Patient on rosuvastatin  20 mg daily

## 2023-10-14 NOTE — Patient Instructions (Signed)
 Nice to see you today  Call and schedule your mammogram and bone density scan at  [facility] [address] ( on hospital grounds) [place], [place]  [phone]  We did up date your flu vaccine today

## 2023-10-14 NOTE — Progress Notes (Signed)
 Established Patient Office Visit  Subjective   Patient ID: Rachel Donovan, female    DOB: December 03, 1945  Age: 78 y.o. MRN: [id number]  Chief Complaint  Patient presents with   Annual Exam    Would like flu shot    Medication Refill    FaBB- folic acid. Pt wants to know if she needs this script filled.     HPI  HTN: Patient currently maintained on amlodipine  5 mg daily along with losartan  100 mg daily, metoprolol  50 mg daily.  Patient is followed by cardiology Dr. Aleene Finely  Depression: Currently maintained on Wellbutrin  150 mg daily along with BuSpar  15 mg twice daily.  Also lorazepam  1 mg daily as needed  Hypothyroidism: Currently maintained on levothyroxine  112 mcg daily  HLD: Currently maintained on rosuvastatin  20 mg daily  for complete physical and follow up of chronic conditions.  Immunizations: -Tetanus: Completed in 2018 -Influenza: Update today -Shingles: Completed first vaccine.  Had adverse drug reaction -Pneumonia: Completed   Diet: Fair diet. She is eating 2 meals a day normally. Eats out most of the time. She does snack sometimes and has healthy version. Coffee, water, soda, tea, and wine Exercise: No regular exercise.  Eye exam: Completes annually. glasses  Dental exam: Completes semi-annually    Colonoscopy: Completed in 09/13/2020, no repeat recommended due to patient's age Lung Cancer Screening: N/A  Pap smear: Partial hysterectomy, aged out  Mammogram: 12/27/2022, repeat 1 year, order placed  DEXA: 12/13/2021, osteoporosis order placed.  Sleep: 11-1028 and will get up around 8. Feels rested   Advanced directive: does have an advance directive       10/14/2023    9:59 AM 10/04/2022   12:55 PM  MMSE - Mini Mental State Exam  Orientation to time 3 2  Orientation to Place 5 4  Registration 3 3  Attention/ Calculation 5 5  Recall 3 2  Language- name 2 objects 2 2  Language- repeat 1 1  Language- follow 3 step command 3 3  Language- read &  follow direction 1 1  Write a sentence 1 1  Copy design 0 1  Total score 27 25       Review of Systems  Constitutional:  Negative for chills and fever.  Respiratory:  Negative for shortness of breath.   Cardiovascular:  Negative for chest pain and leg swelling.  Gastrointestinal:  Negative for abdominal pain, blood in stool, constipation, diarrhea, nausea and vomiting.       BM Daily  Genitourinary:  Negative for dysuria and hematuria.  Neurological:  Negative for dizziness, tingling and headaches.  Psychiatric/Behavioral:  Negative for hallucinations and suicidal ideas.       Objective:     BP 138/78   Pulse (!) 55   Temp (!) 97.5 F (36.4 C) (Oral)   Ht 5' 1 (1.549 m)   Wt 114 lb 12.8 oz (52.1 kg)   SpO2 97%   BMI 21.69 kg/m  BP Readings from Last 3 Encounters:  10/14/23 138/78  05/14/23 (!) 152/88  04/30/23 106/60   Wt Readings from Last 3 Encounters:  10/14/23 114 lb 12.8 oz (52.1 kg)  05/14/23 116 lb (52.6 kg)  04/30/23 116 lb 3.2 oz (52.7 kg)   SpO2 Readings from Last 3 Encounters:  10/14/23 97%  05/14/23 98%  04/07/23 96%      Physical Exam Vitals and nursing note reviewed.  Constitutional:      Appearance: Normal appearance.  HENT:  Right Ear: Tympanic membrane, ear canal and external ear normal.     Left Ear: Tympanic membrane, ear canal and external ear normal.     Mouth/Throat:     Mouth: Mucous membranes are moist.     Pharynx: Oropharynx is clear.  Eyes:     Extraocular Movements: Extraocular movements intact.     Pupils: Pupils are equal, round, and reactive to light.  Cardiovascular:     Rate and Rhythm: Normal rate and regular rhythm.     Pulses: Normal pulses.     Heart sounds: Normal heart sounds.  Pulmonary:     Effort: Pulmonary effort is normal.     Breath sounds: Normal breath sounds.  Abdominal:     General: Bowel sounds are normal. There is no distension.     Palpations: There is no mass.     Tenderness: There is no  abdominal tenderness.     Hernia: No hernia is present.  Musculoskeletal:     Right lower leg: No edema.     Left lower leg: No edema.  Lymphadenopathy:     Cervical: No cervical adenopathy.  Skin:    General: Skin is warm.  Neurological:     General: No focal deficit present.     Mental Status: She is alert.     Deep Tendon Reflexes:     Reflex Scores:      Bicep reflexes are 2+ on the right side and 2+ on the left side.      Patellar reflexes are 2+ on the right side and 2+ on the left side.    Comments: Bilateral upper and lower extremity strength 5/5  Psychiatric:        Mood and Affect: Mood normal.        Behavior: Behavior normal.        Thought Content: Thought content normal.        Judgment: Judgment normal.      No results found for any visits on 10/14/23.    The ASCVD Risk score (Arnett DK, et al., 2019) failed to calculate for the following reasons:   Risk score cannot be calculated because patient has a medical history suggesting prior/existing ASCVD    Assessment & Plan:   Problem List Items Addressed This Visit       Cardiovascular and Mediastinum   Essential hypertension - Primary   Patient currently maintained on amlodipine  5 mg, losartan  100 mg, metoprolol  50 mg daily.  Followed by cardiology.  Patient's blood pressures were labile at home but good readings in office we will continue medication as prescribed.  Follow-up with cardiology as recommended      Relevant Orders   CBC with Differential/Platelet   Comprehensive metabolic panel with GFR   TSH   Lipid panel     Endocrine   Postoperative hypothyroidism   Patient currently maintained on levothyroxine  112 mcg daily.  Pending thyroid  functions today      Relevant Orders   VITAMIN D  25 Hydroxy (Vit-D Deficiency, Fractures)     Other   Anxiety   History of the same currently maintained on BuSpar  15 mg twice daily and Wellbutrin  150 mg daily.  Consider switching Wellbutrin  to an SSRI such  as sertraline .  Patient has  tried and failed Lexapro which caused subsequent hyponatremia.      Relevant Orders   TSH   History of stroke   Incidental finding on MRI.  Patient on rosuvastatin  20 mg daily  Relevant Orders   Lipid panel   Mixed hyperlipidemia   History of the same.  Patient currently maintained on rosuvastatin  20 mg daily.  Pending lipid panel      Relevant Orders   Lipid panel   Memory impairment   History of the same.  MMSE 25 last year.  MMSE 27 this year.  Having some memory that she thinks is normal for her age.  Concern for some behavioral lability with memory impairment.  She is followed by neurology send a message to see if they can manage that or if this requires a new referral.  Also consider switching patient from Wellbutrin  to sertraline .  She was on Lexapro in the past but experienced profound hyponatremia      Relevant Orders   Vitamin B12   Former smoker   Distant history of smoking pending urine microscopy rule out microscopic hematuria      Relevant Orders   Urine Microscopic   Other Visit Diagnoses       Age-related osteoporosis without current pathological fracture       Relevant Orders   Comprehensive metabolic panel with GFR   VITAMIN D  25 Hydroxy (Vit-D Deficiency, Fractures)   DG Bone Density     Encounter for hepatitis C screening test for low risk patient       Relevant Orders   Hepatitis C antibody     Screening mammogram for breast cancer         Need for influenza vaccination       Relevant Orders   Flu vaccine HIGH DOSE PF(Fluzone Trivalent) (Completed)       Return in about 3 months (around 01/13/2024) for memory/BP.    Adina Crandall, NP

## 2023-10-14 NOTE — Telephone Encounter (Signed)
 Referral placed.

## 2023-10-14 NOTE — Assessment & Plan Note (Signed)
 History of the same currently maintained on BuSpar  15 mg twice daily and Wellbutrin  150 mg daily.  Consider switching Wellbutrin  to an SSRI such as sertraline .  Patient has  tried and failed Lexapro which caused subsequent hyponatremia.

## 2023-10-14 NOTE — Telephone Encounter (Signed)
 Rachel Donovan, please put in the new referral; in this case I might see her sooner than February. I will include our referral team.   Thanks

## 2023-10-14 NOTE — Telephone Encounter (Signed)
 Dr. Gregg,  I saw Rachel Donovan today. There is concern about memory. I know she is established with you and has an appt in Feb. Not sure if they can discuss that with you are do they need a new referral?  Thanks, [employer]

## 2023-10-15 LAB — HEPATITIS C ANTIBODY: Hepatitis C Ab: NONREACTIVE

## 2023-10-16 ENCOUNTER — Ambulatory Visit: Payer: Self-pay | Admitting: Nurse Practitioner

## 2023-10-16 DIAGNOSIS — M810 Age-related osteoporosis without current pathological fracture: Secondary | ICD-10-CM

## 2023-10-16 DIAGNOSIS — F419 Anxiety disorder, unspecified: Secondary | ICD-10-CM

## 2023-10-16 MED ORDER — SERTRALINE HCL 50 MG PO TABS: ORAL_TABLET | ORAL | 0 refills | Status: DC

## 2023-10-16 NOTE — Telephone Encounter (Signed)
 Copied from CRM [id number]. Topic: Clinical - Medication Question >> Oct 16, 2023  3:36 PM Turkey A wrote: Reason for CRM: Patient's daughter in law would like to know how patient would take sertraline  (ZOLOFT ) 50 MG tablet along with other medications-please contact

## 2023-10-16 NOTE — Telephone Encounter (Signed)
-----   Message from [facility] T sent at 10/16/2023 12:33 PM EDT ----- Called patient reviewed all information and repeated back to me. Randine on DPR wants to know if patient is going to be on any new or updated medication? Leone mentioned ant-depressants or anxiety meds.)  Randine states pt and husband are leaving for the whole month of October and wants to make sure she can pick up any medication before that time.  Randine also wants to know if patient should still take the FABB vitamin since her labs were good.  ----- Message ----- From: Wendee Lynwood HERO, NP Sent: 10/16/2023   7:42 AM EDT To: Wendee Gunnels  Notified via My Chart   They mentioned that they would like a call. I did reach out to her neurologist and he suggested for me to place a new referral ( I did) and they will get her scheduled up sooner than February  ----- Message ----- From: Interface, [facility] Sent: 10/14/2023  12:52 PM EDT To: Lynwood HERO Wendee, NP

## 2023-10-16 NOTE — Telephone Encounter (Signed)
 She does not need the FABB  We can stop the welbutrin and try zoloft . 25mg  at bedtime for 2 weeks then go up to 50mg  there after. I need to see her in about 6 weeks (when they get back from the beach)

## 2023-10-17 ENCOUNTER — Other Ambulatory Visit: Payer: Self-pay | Admitting: Nurse Practitioner

## 2023-10-17 DIAGNOSIS — Z1231 Encounter for screening mammogram for malignant neoplasm of breast: Secondary | ICD-10-CM

## 2023-10-17 NOTE — Telephone Encounter (Signed)
-----   Message from [facility] T sent at 10/16/2023  4:42 PM EDT -----  ----- Message ----- From: Wendee Lynwood HERO, NP Sent: 10/16/2023   1:45 PM EDT To: Wendee Gunnels  ----- Message from Lynwood HERO Wendee, NP sent at 10/16/2023  1:45 PM EDT -----   ----- Message ----- From: Sebastian Shu, CMA Sent: 10/16/2023  12:33 PM EDT To: Lynwood HERO Wendee, NP  Called patient reviewed all information and repeated back to me. Randine on DPR wants to know if patient is going to be on any new or updated medication? Leone mentioned ant-depressants or anxiety meds.)  Randine states pt and husband are leaving for the whole month of October and wants to make sure she can pick up any medication before that time.  Randine also wants to know if patient should still take the FABB vitamin since her labs were good.  ----- Message ----- From: Wendee Lynwood HERO, NP Sent: 10/16/2023   7:42 AM EDT To: Wendee Gunnels  Notified via My Chart   They mentioned that they would like a call. I did reach out to her neurologist and he suggested for me to place a new referral ( I did) and they will get her scheduled up sooner than February  ----- Message ----- From: Interface, Lab In Three [facility] One Sent: 10/14/2023  12:52 PM EDT To: Lynwood HERO Wendee, NP

## 2023-10-17 NOTE — Telephone Encounter (Signed)
 She will stop the wellbutrin  all together and then she can take the zoloft  before bed. She can take it with other medications if she takes any in the evening before bed. If not she can take it by itself. It should not interact with any of her medications

## 2023-10-20 ENCOUNTER — Ambulatory Visit: Admitting: Podiatry

## 2023-10-20 ENCOUNTER — Encounter: Payer: Self-pay | Admitting: Podiatry

## 2023-10-20 DIAGNOSIS — Q828 Other specified congenital malformations of skin: Secondary | ICD-10-CM | POA: Diagnosis not present

## 2023-10-20 DIAGNOSIS — M79674 Pain in right toe(s): Secondary | ICD-10-CM | POA: Diagnosis not present

## 2023-10-20 DIAGNOSIS — I739 Peripheral vascular disease, unspecified: Secondary | ICD-10-CM

## 2023-10-20 DIAGNOSIS — G609 Hereditary and idiopathic neuropathy, unspecified: Secondary | ICD-10-CM

## 2023-10-20 DIAGNOSIS — M79675 Pain in left toe(s): Secondary | ICD-10-CM

## 2023-10-20 DIAGNOSIS — B351 Tinea unguium: Secondary | ICD-10-CM

## 2023-10-20 NOTE — Progress Notes (Signed)
  Subjective:  Patient ID: Rachel Donovan, female    DOB: 04/12/1945,  MRN: [id number]  Rachel JINNY Burda presents to clinic today for at risk foot care. Patient has h/o PAD and painful porokeratotic lesion(s) left foot and painful mycotic toenails that limit ambulation. Painful toenails interfere with ambulation. Aggravating factors include wearing enclosed shoe gear. Pain is relieved with periodic professional debridement. Painful porokeratotic lesions are aggravated when weightbearing with and without shoegear. Pain is relieved with periodic professional debridement. The callus is tender. Chief Complaint  Patient presents with   RFC    Rm3 Routine foot care/ NPJames Cable last visit October 13 2023   New problem(s): None.   PCP is Wendee Lynwood HERO, NP.  Allergies  Allergen Reactions   Codeine Hives   Shingrix [Zoster Vac Recomb Adjuvanted]    Sulfa Antibiotics Hives   Penicillins Hives, Other (See Comments) and Rash    Review of Systems: Negative except as noted in the HPI.  Objective: No changes noted in today's physical examination. There were no vitals filed for this visit. Rachel Donovan is a pleasant 78 y.o. female thin build in NAD. AAO x 3.  Vascular Examination: Capillary refill time <3 seconds b/l LE. Palpable pedal pulses b/l LE. Digital hair present b/l. Skin temperature gradient WNL b/l. No varicosities b/l. Trace edema noted left ankle and right ankle..  Dermatological Examination: Pedal skin with normal turgor, texture and tone b/l. No open wounds. No interdigital macerations b/l. Toenails b/l great toes and right 2nd toe thickened, discolored, dystrophic with subungual debris. There is pain on palpation to dorsal aspect of nailplates. Porokeratotic lesion(s) submet head 2 left foot. No erythema, no edema, no drainage, no fluctuance.  Neurological Examination: Pt has subjective symptoms of neuropathy. Protective sensation intact with 10 gram monofilament b/l LE. Vibratory  sensation intact b/l LE.   Musculoskeletal Examination: Muscle strength 5/5 to all lower extremity muscle groups bilaterally. Hammertoe(s) noted to the left second digit, left third digit, and left fourth digit.  Assessment/Plan: 1. Pain due to onychomycosis of toenails of both feet   2. Porokeratosis   3. PAD (peripheral artery disease) (HCC)   4. Idiopathic peripheral neuropathy   Consent given for treatment. Patient examined. All patient's and/or POA's questions/concerns addressed on today's visit. Toenails 1-5 debrided in length and girth without incident. Porokeratotic lesion(s) submet head 3 left foot pared and enucleated with sharp debridement without incident. Continue foot and shoe inspections daily. Monitor blood glucose per PCP/Endocrinologist's recommendations.Continue soft, supportive shoe gear daily. Report any pedal injuries to medical professional. Call office if there are any questions/concerns. Return in about 9 weeks (around 12/22/2023).  Delon LITTIE Merlin, DPM      [facility] LOCATION: [address], [place] [address]                   Office [phone]   [facility] LOCATION: [address] [place], [place] [address] Office [phone]

## 2023-10-21 ENCOUNTER — Telehealth: Payer: Self-pay

## 2023-10-21 NOTE — Progress Notes (Unsigned)
     [name], MD, CAQ Sports Medicine [facility] at [facility] [address] [place], [address]  Phone: [phone]  FAX: [phone]  Rachel Donovan - 78 y.o. female  MRN [id number]  Date of Birth: 1945/05/14  Date: 10/23/2023  PCP: Wendee Lynwood HERO, NP  Referral: Wendee Lynwood HERO, NP  No chief complaint on file.  Subjective:   Rachel Donovan is a 78 y.o. very pleasant female patient with There is no height or weight on file to calculate BMI. who presents with the following:  Discussed the use of AI scribe software for clinical note transcription with the patient, who gave verbal consent to proceed.  Patient presents a right sided trigger finger.  There is also a question on the right sided leg or hamstring pain. History of Present Illness     Review of Systems is noted in the HPI, as appropriate  Objective:   There were no vitals taken for this visit.  GEN: No acute distress; alert,appropriate. PULM: Breathing comfortably in no respiratory distress PSYCH: Normally interactive.   Physical Exam   Laboratory and Imaging Data:  Assessment and Plan:   No diagnosis found. Assessment & Plan   Medication Management during today's office visit: No orders of the defined types were placed in this encounter.  There are no discontinued medications.  Orders placed today for conditions managed today: No orders of the defined types were placed in this encounter.   Disposition: No follow-ups on file.  Dragon [employer] One speech-to-text software was used for transcription in this dictation.  Possible transcriptional errors can occur using Animal nutritionist.   Signed,  Jacques DASEN. Anaelle Dunton, MD   Outpatient Encounter Medications as of 10/23/2023  Medication Sig   amLODipine  (NORVASC ) 5 MG tablet TAKE 1 TABLET(5 MG) BY MOUTH DAILY   azithromycin (ZITHROMAX) 250 MG tablet Take by mouth.   busPIRone  (BUSPAR ) 15 MG tablet Take 1 tablet (15 mg  total) by mouth 2 (two) times daily.   Calcium  Carbonate-Vit D-Min (CALCIUM  1200 PO) Take 1 tablet by mouth 2 (two) times daily. Takes Citracal   dipyridamole -aspirin  (AGGRENOX ) 200-25 MG 12hr capsule Take 1 capsule by mouth 2 (two) times daily.   Folic Acid-Vit B6-Vit B12 (FABB) 2.2-25-1 MG TABS Take 1 tablet by mouth daily.   levothyroxine  (SYNTHROID ) 112 MCG tablet Take 1 tablet (112 mcg total) by mouth daily.   LORazepam  (ATIVAN ) 1 MG tablet TAKE 1 TABLET(1 MG) BY MOUTH DAILY AS NEEDED FOR ANXIETY   losartan  (COZAAR ) 100 MG tablet Take 1 tablet (100 mg total) by mouth daily.   metoprolol  succinate (TOPROL -XL) 50 MG 24 hr tablet TAKE 1 TABLET(50 MG) BY MOUTH DAILY   Multiple Vitamin (GNP ESSENTIAL ONE DAILY) TABS Take by mouth daily.    Multiple Vitamins-Minerals (PRESERVISION AREDS 2 PO) Take by mouth 2 (two) times daily.   rosuvastatin  (CRESTOR ) 20 MG tablet Take 1 tablet (20 mg total) by mouth daily.   sertraline  (ZOLOFT ) 50 MG tablet Take 0.5 tablets (25 mg total) by mouth daily for 14 days, THEN 1 tablet (50 mg total) daily for 16 days.   Facility-Administered Encounter Medications as of 10/23/2023  Medication   0.9 %  sodium chloride  infusion

## 2023-10-21 NOTE — Telephone Encounter (Signed)
 Copied from CRM [id number]. Topic: Clinical - Medication Question >> Oct 20, 2023  3:13 PM Viola F wrote: Reason for CRM: Patient daughter in law Traci requested call back from Jenae regarding the sertraline  (ZOLOFT ) 50 MG tablets - she wants to confirm that patient should stop the buPROPion . Please call her at [phone]

## 2023-10-23 ENCOUNTER — Encounter: Payer: Self-pay | Admitting: Family Medicine

## 2023-10-23 ENCOUNTER — Ambulatory Visit (INDEPENDENT_AMBULATORY_CARE_PROVIDER_SITE_OTHER): Admitting: Family Medicine

## 2023-10-23 VITALS — BP 136/86 | HR 62 | Temp 99.4°F | Ht 61.0 in | Wt 116.2 lb

## 2023-10-23 DIAGNOSIS — M7062 Trochanteric bursitis, left hip: Secondary | ICD-10-CM

## 2023-10-23 DIAGNOSIS — M549 Dorsalgia, unspecified: Secondary | ICD-10-CM | POA: Diagnosis not present

## 2023-10-23 DIAGNOSIS — M5410 Radiculopathy, site unspecified: Secondary | ICD-10-CM

## 2023-10-23 DIAGNOSIS — M7061 Trochanteric bursitis, right hip: Secondary | ICD-10-CM

## 2023-10-23 DIAGNOSIS — M79604 Pain in right leg: Secondary | ICD-10-CM

## 2023-10-23 DIAGNOSIS — G8929 Other chronic pain: Secondary | ICD-10-CM

## 2023-10-23 MED ORDER — TIZANIDINE HCL 2 MG PO TABS: 2.0000 mg | ORAL_TABLET | Freq: Every evening | ORAL | 1 refills | Status: DC | PRN

## 2023-10-23 NOTE — Patient Instructions (Signed)
Hip Rehab:  Hip Flexion: Toe up to ceiling, laying on your back. Lift your whole leg, 3 sets. Work up to being able to do #30 with each set.  Hip elevations, Toe and leg turned out to side.  Lift whole leg, 3 sets. Work up to being able to do #30 with each set.  Hip Abductions: Lying on side, straight out to side. 3 sets, work up to being able to do #30 with each set.  At the beginning you may only be able to do a lot less, try to do #10.

## 2023-10-27 ENCOUNTER — Encounter: Payer: Self-pay | Admitting: Nurse Practitioner

## 2023-11-27 ENCOUNTER — Other Ambulatory Visit: Payer: Self-pay | Admitting: Nurse Practitioner

## 2023-11-27 DIAGNOSIS — F419 Anxiety disorder, unspecified: Secondary | ICD-10-CM

## 2023-12-16 ENCOUNTER — Ambulatory Visit: Attending: Family Medicine

## 2023-12-16 DIAGNOSIS — M5410 Radiculopathy, site unspecified: Secondary | ICD-10-CM | POA: Diagnosis not present

## 2023-12-16 DIAGNOSIS — M5417 Radiculopathy, lumbosacral region: Secondary | ICD-10-CM | POA: Insufficient documentation

## 2023-12-16 DIAGNOSIS — M549 Dorsalgia, unspecified: Secondary | ICD-10-CM | POA: Diagnosis not present

## 2023-12-16 DIAGNOSIS — M79604 Pain in right leg: Secondary | ICD-10-CM | POA: Diagnosis not present

## 2023-12-16 DIAGNOSIS — M79651 Pain in right thigh: Secondary | ICD-10-CM | POA: Diagnosis present

## 2023-12-16 DIAGNOSIS — M79652 Pain in left thigh: Secondary | ICD-10-CM | POA: Diagnosis present

## 2023-12-16 DIAGNOSIS — G8929 Other chronic pain: Secondary | ICD-10-CM | POA: Insufficient documentation

## 2023-12-16 DIAGNOSIS — M5459 Other low back pain: Secondary | ICD-10-CM | POA: Diagnosis present

## 2023-12-16 NOTE — Therapy (Signed)
 OUTPATIENT PHYSICAL THERAPY THORACOLUMBAR EVALUATION   Patient Name: Rachel Donovan MRN: [id number] DOB:10-May-1945, 78 y.o., female Today's Date: 12/16/2023  END OF SESSION:  PT End of Session - 12/16/23 1302     Visit Number 1    Number of Visits 17    Date for Recertification  02/13/24    PT Start Time 1302    PT Stop Time 1350    PT Time Calculation (min) 48 min    Activity Tolerance Patient tolerated treatment well    Behavior During Therapy WFL for tasks assessed/performed          Past Medical History:  Diagnosis Date   Anxiety    Aortic atherosclerosis    Arthritis    Benign neoplasm of colon, unspecified    Colon polyp    CVA (cerebral vascular accident) (HCC) 2006   Depression    High cholesterol    Hyperlipidemia    Hyperplastic colon polyp    Hypertension    Hypothyroid    surgical   IGT (impaired glucose tolerance)    Melanoma (HCC)    Migraines    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    OA (osteoarthritis)    PVD (peripheral vascular disease)    Rectocele    Shingles    Past Surgical History:  Procedure Laterality Date   ABDOMINAL HYSTERECTOMY     BREAST BIOPSY Right    X 2   BREAST BIOPSY Left    BREAST SURGERY     BX breast lumps X 3   CATARACT EXTRACTION, BILATERAL     COLONOSCOPY     PARTIAL HYSTERECTOMY     REPLACEMENT TOTAL KNEE Bilateral    RIGHT WRIST TENDON     THYROIDECTOMY     Patient Active Problem List   Diagnosis Date Noted   Syncope and collapse 02/12/2023   Dehydration 02/12/2023   Decreased GFR 02/12/2023   GAD (generalized anxiety disorder) 12/31/2022   Hyponatremia 11/01/2022   Memory impairment 10/04/2022   Former smoker 10/04/2022   Unintentional weight loss 10/04/2022   Hypothyroidism 03/12/2021   Bilateral hearing loss 10/19/2020   Ear itching 10/19/2020   Peripheral vascular disease 09/20/2020   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   Cardiac  arrhythmia 07/30/2019   Dysuria 07/30/2019   Spasm 07/30/2019   Anxiety 06/09/2019   Essential hypertension 06/09/2019   Greater trochanteric pain syndrome 06/09/2019   Hardening of the aorta (main artery of the heart) 05/31/2019   Benign neoplasm of colon 05/31/2019   Major depression, single episode 05/31/2019   Other specified diseases of blood and blood-forming organs 05/24/2019   Dyspnea 12/18/2018   Disorder of bone 05/27/2018   Urinary tract infectious disease 05/27/2018   Impacted cerumen of both ears 05/13/2017   Impacted cerumen 04/16/2017   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HCC) 04/16/2017   Encounter for general adult medical examination without abnormal findings 04/09/2017   Acute bronchitis 01/21/2017   Abnormal glucose tolerance test 12/03/2016   Asthma without status asthmaticus 12/03/2016   Migraine 12/03/2016   Carpal tunnel syndrome 01/31/2016   Paresthesia of hand 01/31/2016   Atrophic vaginitis 05/18/2014   Rectocele 05/18/2014   History of stroke 12/29/2013   Impaired fasting glucose 12/29/2013   Sciatica 03/27/2012   Family history of diabetes mellitus 01/13/2012   Idiopathic peripheral neuropathy 01/13/2012   Cerebral infarction (HCC) 11/19/2011   Mixed hyperlipidemia 11/19/2011   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  Unilateral primary osteoarthritis, unspecified knee 11/19/2011   Stroke (HCC) 09/04/2005    PCP: Wendee Lynwood HERO, NP   REFERRING PROVIDER: Watt Mirza, MD  REFERRING DIAG: Diagnosis M79.604 (ICD-10-CM) - Right leg pain M54.9,G89.29 (ICD-10-CM) - Chronic back pain greater than 3 months duration M54.10 (ICD-10-CM) - Bilateral radiating leg pain  Rationale for Evaluation and Treatment: Rehabilitation  THERAPY DIAG:  Radiculopathy, lumbosacral region - Plan: PT plan of care cert/re-cert  Other low back pain - Plan: PT plan of care cert/re-cert  Pain in right thigh - Plan: PT plan of care cert/re-cert  Pain in left thigh  - Plan: PT plan of care cert/re-cert  ONSET DATE: October 2025  SUBJECTIVE:                                                                                                                                                                                           SUBJECTIVE STATEMENT: B posterior hip and thighs: 10/10 at worst (at time of onset).  Going on a cruise December or January for 9 days.    PERTINENT HISTORY:  Low back pain with B LE radiating symptoms. Pain is located B posterior thighs to knees (along the hamstrings/sciatic nerve pathway. Pain started 2nd week of October 2025, pt walked on a soft sand, was not able to get very far which cased per symptoms. Pain is the same since onset.     Had a hx of CVA L UE 2006.    No latex allergies.     PAIN:  Are you having pain? Yes: NPRS scale: 2/10 Pain location: B posterior hip and thighs, not past knees Pain description: sore, like someone punched her in her thighs.  Aggravating factors: turning over in bed, sit <> stand, walking; standing; stairs; arching her back.  Relieving factors: bending over, sitting  PRECAUTIONS: no known precautions  RED FLAGS: Bowel or bladder incontinence: No and Cauda equina syndrome: No   WEIGHT BEARING RESTRICTIONS: No  FALLS:  Has patient fallen in last 6 months? No  LIVING ENVIRONMENT: Lives with: lives with their spouse Lives in: House/apartment Stairs: Yes: Internal: 16 steps; on left going up Has following equipment at home: None  OCCUPATION: Retired  PLOF: Independent  PATIENT GOALS: Get up and down stairs or anywhere more comfortably  NEXT MD VISIT: yes but pt does know.   OBJECTIVE:  Note: Objective measures were completed at Evaluation unless otherwise noted.  DIAGNOSTIC FINDINGS:    PATIENT SURVEYS:  Modified Oswestry:  MODIFIED OSWESTRY DISABILITY SCALE  Date: 12/16/2023 Score  Pain intensity 2 =  Pain medication provides me with complete relief from pain.  2. Personal care (washing, dressing, etc.) 1 =  I can take care of myself normally, but it increases my pain.  3. Lifting 3 = Pain prevents me from lifting heavy weights, but I can manage light to medium weights if they are conveniently positioned  4. Walking 2 =  Pain prevents me from walking more than  mile.  5. Sitting 1 =  I can only sit in my favorite chair as long as I like.  6. Standing 3 =  Pain prevents me from standing more than 1/2 hour.  7. Sleeping 1 = I can sleep well only by using pain medication.  8. Social Life 2 = Pain prevents me from participating in more energetic activities (eg. sports, dancing).  9. Traveling 1 =  I can travel anywhere, but it increases my pain.  10. Employment/ Homemaking 3 = Pain prevents me from doing anything but light duties.  Total 19/50 (38%)   Interpretation of scores: Score Category Description  0-20% Minimal Disability The patient can cope with most living activities. Usually no treatment is indicated apart from advice on lifting, sitting and exercise  21-40% Moderate Disability The patient experiences more pain and difficulty with sitting, lifting and standing. Travel and social life are more difficult and they may be disabled from work. Personal care, sexual activity and sleeping are not grossly affected, and the patient can usually be managed by conservative means  41-60% Severe Disability Pain remains the main problem in this group, but activities of daily living are affected. These patients require a detailed investigation  61-80% Crippled Back pain impinges on all aspects of the patient's life. Positive intervention is required  81-100% Bed-bound These patients are either bed-bound or exaggerating their symptoms  Bluford FORBES Zoe DELENA Karon DELENA, et al. Surgery versus conservative management of stable thoracolumbar fracture: the PRESTO feasibility RCT. [place] ([place]): [employer]; 2021 Nov. [facility] [employer] Assessment, No.  25.62.) Appendix 3, Oswestry Disability Index category descriptors. Available from: Findjewelers.cz  Minimally Clinically Important Difference (MCID) = 12.8%  COGNITION: Overall cognitive status: Within functional limits for tasks assessed     SENSATION:   MUSCLE LENGTH:   POSTURE: forward neck, thoracic flexion, B protracted shoulders, R trunk side bend, decreased B hip extension  PALPATION:   LUMBAR ROM:   AROM eval  Flexion WFL   Extension WFL with reproduction of symptoms  Right lateral flexion WFL   Left lateral flexion WFL  Right rotation [facility] with R posterior thigh pain (no pain when performed in sitting but with low back pain)  Left rotation WFL with R posterior thigh pain (no pain when performed in sitting)   (Blank rows = not tested)  LOWER EXTREMITY ROM:     Passive  Right eval Left eval  Hip flexion    Hip extension    Hip abduction    Hip adduction    Hip internal rotation (in sitting) 60 51  Hip external rotation (in sitting)  35 42  Knee flexion    Knee extension    Ankle dorsiflexion    Ankle plantarflexion    Ankle inversion    Ankle eversion     (Blank rows = not tested)  LOWER EXTREMITY MMT:    MMT Right eval Left eval  Hip flexion 4 4-  Hip extension (seated manually resisted) 3+ 3  Hip abduction (seated manually resisted) 4- 3+  Hip adduction    Hip internal rotation    Hip external rotation  Knee flexion 4 4-  Knee extension 5 4+  Ankle dorsiflexion    Ankle plantarflexion    Ankle inversion    Ankle eversion     (Blank rows = not tested)  LUMBAR SPECIAL TESTS:  (-) Piriformis test on L and R. Pt states that position feels pretty good.    FUNCTIONAL TESTS:    Decreased B femoral control with sit <> stand    GAIT: Distance walked: 30 ft Assistive device utilized: None Level of assistance: Complete Independence Comments: antalgic, decreased stance R LE, B genu valgus, decreased B knee  extension during stance phase, forward flexed.   TREATMENT DATE: 12/16/2023                                                                                                                               Blood pressure L arm sitting, mechanically taken, normal cuff: 177/78, HR 50 Asymptomatic    After history taking: 173/77, HR 50        PATIENT EDUCATION:  Education details: POC Person educated: Patient Education method: Explanation Education comprehension: verbalized understanding  HOME EXERCISE PROGRAM:   ASSESSMENT:  CLINICAL IMPRESSION: Patient is a 78 y.o. female who was seen today for physical therapy evaluation and treatment for B posterior hip, thigh, and knee pain. She also demonstrates low back pain, altered gait pattern and posture, decreased trunk and B hip strength, decreased B femoral control, reproduction of symptoms with lumbar extension as well as R and L trunk rotation, and difficulty performing bed mobility, standing tasks, transfers, and stair negotiation secondary to pain. Pt will benefit from skilled physical therapy services to address the aforementioned deficits.   OBJECTIVE IMPAIRMENTS: decreased strength, improper body mechanics, postural dysfunction, and pain.   ACTIVITY LIMITATIONS: standing, stairs, transfers, bed mobility, and locomotion level  PARTICIPATION LIMITATIONS:   PERSONAL FACTORS: Age, Fitness, and 3+ comorbidities: CVA, depression, HTN, Morton's Neuroma are also affecting patient's functional outcome.   REHAB POTENTIAL: Fair    CLINICAL DECISION MAKING: Stable/uncomplicated  EVALUATION COMPLEXITY: Low   GOALS: Goals reviewed with patient? Yes  SHORT TERM GOALS: Target date: 12/26/2023  Pt will be independent with her initial HEP to decrease pain, improve strength, function, and ability to perform bed mobility, standing tasks, transfers, and stair negotiation more comfortably for her back.  Baseline: Pt has not yet started her  HEP (12/16/2023) Goal status: INITIAL   LONG TERM GOALS: Target date: 02/13/2024  Pt will have a decrease in B posterior hip and thigh pain to 2/10 or less at worst to promote ability to perform bed mobility, transfers, standing tasks, and stair negotiation more comfortably.  Baseline: B posterior hip and thighs: 10/10 at worst (at time of onset) (12/16/2023) Goal status: INITIAL  2.  Pt will improve B hip extension and abduction strength by at least 1/2 MMT grade to promote  ability to perform bed mobility, transfers, standing tasks, and stair negotiation more comfortably.  Baseline:  MMT Right eval Left eval  Hip extension (seated manually resisted) 3+ 3  Hip abduction (seated manually resisted) 4- 3+   Goal status: INITIAL  3.  Pt will improve her Modified [place]y Low Back Pain disability Questionnaire by at least 10 % as a demonstration of improved function.  Baseline: 38% (12/16/2023) Goal status: INITIAL   PLAN:  PT FREQUENCY: 1-2x/week  PT DURATION: 8 weeks  PLANNED INTERVENTIONS: 97110-Therapeutic exercises, 97530- Therapeutic activity, 97112- Neuromuscular re-education, 97535- Self Care, 02859- Manual therapy, G0283- Electrical stimulation (unattended), [phone]- Traction (mechanical), [id number]- Ionotophoresis 4mg /ml Dexamethasone, 79439 (1-2 muscles), 20561 (3+ muscles)- Dry Needling, Patient/Family education, Balance training, Stair training, Joint mobilization, and Spinal mobilization.  PLAN FOR NEXT SESSION: Posture, trunk and glute strengthening, femoral control, manual techniques, modalities PRN.    Nori Poland, PT, DPT 12/16/2023, 7:23 PM

## 2023-12-19 ENCOUNTER — Other Ambulatory Visit: Payer: Self-pay | Admitting: Family Medicine

## 2023-12-19 NOTE — Telephone Encounter (Signed)
 Last office visit 10/23/23 with Dr. Watt for trigger finger and leg pain.  Last refilled 10/23/23 for #30 with 1 refill.  Next Appt: 12/22/23 with Dr. Watt for follow up.

## 2023-12-21 NOTE — Progress Notes (Signed)
 "    Rachel Hinger T. Coleson Kant, MD, CAQ Sports Medicine [facility] at [facility] [address] [place], [address]  Phone: [phone]  FAX: [phone]  [name] - 78 y.o. female  MRN [id number]  Date of Birth: September 06, 1945  Date: 12/22/2023  PCP: Wendee Lynwood HERO, NP  Referral: Wendee Lynwood HERO, NP  Chief Complaint  Patient presents with   Leg Pain    Follow Up   Subjective:   Rachel Donovan is a 78 y.o. very pleasant female patient with Body mass index is 22.48 kg/m. who presents with the following:  Discussed the use of AI scribe software for clinical note transcription with the patient, who gave verbal consent to proceed.  Patient presents in follow-up.  I saw her 2 months ago with some lumbar radiculopathy.  Then a loss saw her, sent her to physical therapy, she also was having some significant weakness in the hips and bilateral trochanteric bursitis.  BP extremely high. History of Present Illness Rachel Donovan is a 78 year old female who presents with worsening back and leg pain.  She has been experiencing worsening back and leg pain over the past few months, with significant discomfort in the buttocks area, particularly on the right side. The pain is most severe in the joint of her buttocks and is exacerbated by walking on sand. It is present upon waking and requires her to bend over to walk. No numbness or tingling in the legs.  She recently started physical therapy but has only attended one session due to scheduling conflicts and being out of town. She has been performing exercises shown to her previously and attempted walking on the beach, which was challenging due to the sand. She mentions a recent fall.  She seemed to not remember her initial physical therapy evaluation.  Her medication regimen includes blood thinners, and she has been prescribed tizanidine  (Zanaflex ) previously, which she did not find helpful. She is also on amlodipine  for blood  pressure management, which is being adjusted due to high readings. Her daughter-in-law manages her medications and is described as very thorough.  She lives in a home with a first-floor bedroom to avoid stairs and has stopped driving.   Review of Systems is noted in the HPI, as appropriate  Objective:   BP (!) 178/86   Pulse (!) 56   Temp 99.1 F (37.3 C) (Temporal)   Ht 5' 1 (1.549 m)   Wt 119 lb (54 kg)   SpO2 97%   BMI 22.48 kg/m   GEN: No acute distress; alert,appropriate. PULM: Breathing comfortably in no respiratory distress PSYCH: Normally interactive.    Range of motion at  the waist: Flexion, extension, lateral bending and rotation: Pain and restriction of motion forward flexion and extension, but lateral bending and rotation maneuvers are more preserved  No echymosis or edema Rises to examination table with mild difficulty Gait: minimally antalgic  Inspection/Deformity: N Paraspinus Tenderness: L3-S1 bilaterally  B Ankle Dorsiflexion (L5,4): 5/5 B Great Toe Dorsiflexion (L5,4): 5/5 Heel Walk (L5): WNL Toe Walk (S1): WNL Rise/Squat (L4): WNL, mild pain  SENSORY B Medial Foot (L4): WNL B Dorsum (L5): WNL B Lateral (S1): WNL Light Touch: WNL  REFLEXES Knee (L4): 2+ Ankle (S1): 2+  B SLR, seated: neg B SLR, supine: neg B FABER: Back pain B Reverse FABER: Back pain B Greater Troch: Mildly tender to palpation B Log Roll: neg B Sciatic Notch: NT   Laboratory and Imaging  Data:  Assessment and Plan:     ICD-10-CM   1. Chronic back pain greater than 3 months duration  M54.9 DG Lumbar Spine Complete   G89.29     2. Bilateral radiating leg pain  M54.10 DG Lumbar Spine Complete    3. 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 Pain likely referred from the back. Mild bursitis noted but not primary pain source. - Ordered lumbar spine x-ray to assess degenerative changes. - Prescribed prednisone  40 mg for 5 days, then 20 mg for 5 days. - Prescribed methocarbamol  for nighttime use, with daytime use if tolerated. - Continue physical therapy for hip and back muscle strengthening. - Reassess in two months for progress evaluation.  Trochanteric bursitis, right hip Mild trochanteric bursitis in the right hip, not primary pain source. Pain secondary to back issues. - Continue physical therapy for hip and back strength.  Hypertension Blood pressure elevated at 178/86, risk for stroke given history. Current medication amlodipine  5 mg daily. - Increased amlodipine  to 10 mg daily using two 5 mg tablets. -Continue metoprolol  50 mg and losartan  100 mg - Monitor blood pressure at home twice daily and record. - Communicate blood pressure readings to Dr. Wendee via MyChart or phone. - Advised dietary modifications, specifically reducing salt intake.  Medication Management during today's office visit: Meds ordered this encounter  Medications   amLODipine  (NORVASC ) 10 MG tablet    Sig: Take 1 tablet (10 mg total) by mouth daily.    Dispense:  90 tablet    Refill:  1   predniSONE  (DELTASONE ) 20 MG tablet    Sig: 2 tabs po daily for 5 days, then 1 tab po daily for 5 days    Dispense:  15 tablet    Refill:  0   methocarbamol  (ROBAXIN ) 500 MG tablet    Sig: Take 1 tablet (500 mg total) by mouth every 8 (eight) hours as needed for muscle spasms.    Dispense:  40 tablet    Refill:  1   Medications Discontinued During This Encounter  Medication Reason   azithromycin (ZITHROMAX) 250 MG tablet Completed Course   amLODipine  (NORVASC ) 5 MG tablet    tiZANidine  (ZANAFLEX ) 2 MG tablet     Orders placed today for conditions managed today: Orders Placed This Encounter  Procedures   DG Lumbar Spine Complete     Disposition: No follow-ups on file.  Dragon [employer] One speech-to-text software was used for transcription in this dictation.  Possible transcriptional errors can occur using Animal nutritionist.   Signed,  Jacques DASEN. Josiah Wojtaszek, MD   Outpatient Encounter Medications as of 12/22/2023  Medication Sig   busPIRone  (BUSPAR ) 15 MG tablet TAKE 1 TABLET(15 MG) BY MOUTH TWICE DAILY   Calcium  Carbonate-Vit D-Min (CALCIUM  1200 PO) Take 1 tablet by mouth 2 (two) times daily. Takes Citracal   dipyridamole -aspirin  (AGGRENOX ) 200-25 MG 12hr capsule Take 1 capsule by mouth 2 (two) times daily.   Folic Acid-Vit B6-Vit B12 (FABB) 2.2-25-1 MG TABS Take 1 tablet by mouth daily.   levothyroxine  (SYNTHROID ) 112 MCG tablet Take 1 tablet (112 mcg total) by mouth daily.   LORazepam  (ATIVAN ) 1 MG tablet TAKE 1 TABLET(1 MG) BY MOUTH DAILY AS NEEDED FOR ANXIETY   losartan  (  COZAAR ) 100 MG tablet TAKE 1 TABLET(100 MG) BY MOUTH DAILY   methocarbamol  (ROBAXIN ) 500 MG tablet Take 1 tablet (500 mg total) by mouth every 8 (eight) hours as needed for muscle spasms.   metoprolol  succinate (TOPROL -XL) 50 MG 24 hr tablet TAKE 1 TABLET(50 MG) BY MOUTH DAILY   Multiple Vitamin (GNP ESSENTIAL ONE DAILY) TABS Take by mouth daily.    Multiple Vitamins-Minerals (PRESERVISION AREDS 2 PO) Take by mouth 2 (two) times daily.   predniSONE  (DELTASONE ) 20 MG tablet 2 tabs po daily for 5 days, then 1 tab po daily for 5 days   rosuvastatin  (CRESTOR ) 20 MG tablet Take 1 tablet (20 mg total) by mouth daily.   sertraline  (ZOLOFT ) 50 MG tablet Take 1 tablet (50 mg total) by mouth daily.   [DISCONTINUED] amLODipine  (NORVASC ) 5 MG tablet TAKE 1 TABLET(5 MG) BY MOUTH DAILY   [DISCONTINUED] tiZANidine  (ZANAFLEX ) 2 MG tablet TAKE 1 TABLET(2 MG) BY MOUTH AT BEDTIME AS NEEDED FOR MUSCLE SPASMS   amLODipine  (NORVASC ) 10 MG tablet Take 1 tablet (10 mg total) by mouth daily.   [DISCONTINUED] azithromycin (ZITHROMAX) 250 MG tablet Take by mouth.    [DISCONTINUED] busPIRone  (BUSPAR ) 15 MG tablet Take 1 tablet (15 mg total) by mouth 2 (two) times daily.   [DISCONTINUED] losartan  (COZAAR ) 100 MG tablet Take 1 tablet (100 mg total) by mouth daily.   Facility-Administered Encounter Medications as of 12/22/2023  Medication   0.9 %  sodium chloride  infusion   "

## 2023-12-22 ENCOUNTER — Ambulatory Visit (INDEPENDENT_AMBULATORY_CARE_PROVIDER_SITE_OTHER)
Admission: RE | Admit: 2023-12-22 | Discharge: 2023-12-22 | Disposition: A | Discharge status: Home / Self Care | Source: Ambulatory Visit | Attending: Family Medicine | Admitting: Family Medicine

## 2023-12-22 ENCOUNTER — Encounter: Payer: Self-pay | Admitting: Family Medicine

## 2023-12-22 ENCOUNTER — Other Ambulatory Visit: Payer: Self-pay | Admitting: Nurse Practitioner

## 2023-12-22 ENCOUNTER — Ambulatory Visit (INDEPENDENT_AMBULATORY_CARE_PROVIDER_SITE_OTHER): Admitting: Family Medicine

## 2023-12-22 ENCOUNTER — Ambulatory Visit

## 2023-12-22 VITALS — BP 178/86 | HR 56 | Temp 99.1°F | Ht 61.0 in | Wt 119.0 lb

## 2023-12-22 DIAGNOSIS — G8929 Other chronic pain: Secondary | ICD-10-CM

## 2023-12-22 DIAGNOSIS — I10 Essential (primary) hypertension: Secondary | ICD-10-CM | POA: Diagnosis not present

## 2023-12-22 DIAGNOSIS — M7061 Trochanteric bursitis, right hip: Secondary | ICD-10-CM

## 2023-12-22 DIAGNOSIS — M549 Dorsalgia, unspecified: Secondary | ICD-10-CM

## 2023-12-22 DIAGNOSIS — M7062 Trochanteric bursitis, left hip: Secondary | ICD-10-CM

## 2023-12-22 DIAGNOSIS — M5410 Radiculopathy, site unspecified: Secondary | ICD-10-CM | POA: Diagnosis not present

## 2023-12-22 MED ORDER — PREDNISONE 20 MG PO TABS: ORAL_TABLET | ORAL | 0 refills | Status: DC

## 2023-12-22 MED ORDER — AMLODIPINE BESYLATE 10 MG PO TABS: 10.0000 mg | ORAL_TABLET | Freq: Every day | ORAL | 1 refills | Status: AC

## 2023-12-22 MED ORDER — METHOCARBAMOL 500 MG PO TABS: 500.0000 mg | ORAL_TABLET | Freq: Three times a day (TID) | ORAL | 1 refills | Status: AC | PRN

## 2023-12-22 NOTE — Patient Instructions (Addendum)
 Increase your Amlodipine  to 10 mg - You can take Amlodipine  5 mg, 2 a day, but I did send in a 10 mg tablet to your pharmacy  Try Methocarbomal at night before bed If that goes ok, you can try taking a dose in the morning, too.

## 2023-12-24 ENCOUNTER — Ambulatory Visit: Payer: Self-pay | Admitting: Family Medicine

## 2023-12-24 ENCOUNTER — Ambulatory Visit

## 2023-12-24 DIAGNOSIS — M5459 Other low back pain: Secondary | ICD-10-CM

## 2023-12-24 DIAGNOSIS — M5417 Radiculopathy, lumbosacral region: Secondary | ICD-10-CM | POA: Diagnosis not present

## 2023-12-24 DIAGNOSIS — M79651 Pain in right thigh: Secondary | ICD-10-CM

## 2023-12-24 DIAGNOSIS — M79652 Pain in left thigh: Secondary | ICD-10-CM

## 2023-12-24 NOTE — Therapy (Signed)
 OUTPATIENT PHYSICAL THERAPY TREATMENT   Patient Name: Rachel Donovan MRN: [id number] DOB:05-01-1945, 78 y.o., female Today's Date: 12/24/2023  END OF SESSION:  PT End of Session - 12/24/23 1520     Visit Number 2    Number of Visits 17    Date for Recertification  02/13/24    PT Start Time 1520    PT Stop Time 1604    PT Time Calculation (min) 44 min    Activity Tolerance Patient tolerated treatment well    Behavior During Therapy WFL for tasks assessed/performed           Past Medical History:  Diagnosis Date   Anxiety    Aortic atherosclerosis    Arthritis    Benign neoplasm of colon, unspecified    Colon polyp    CVA (cerebral vascular accident) (HCC) 2006   Depression    High cholesterol    Hyperlipidemia    Hyperplastic colon polyp    Hypertension    Hypothyroid    surgical   IGT (impaired glucose tolerance)    Melanoma (HCC)    Migraines    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    OA (osteoarthritis)    PVD (peripheral vascular disease)    Rectocele    Shingles    Past Surgical History:  Procedure Laterality Date   ABDOMINAL HYSTERECTOMY     BREAST BIOPSY Right    X 2   BREAST BIOPSY Left    BREAST SURGERY     BX breast lumps X 3   CATARACT EXTRACTION, BILATERAL     COLONOSCOPY     PARTIAL HYSTERECTOMY     REPLACEMENT TOTAL KNEE Bilateral    RIGHT WRIST TENDON     THYROIDECTOMY     Patient Active Problem List   Diagnosis Date Noted   Syncope and collapse 02/12/2023   GAD (generalized anxiety disorder) 12/31/2022   Hyponatremia 11/01/2022   Memory impairment 10/04/2022   Former smoker 10/04/2022   Unintentional weight loss 10/04/2022   Hypothyroidism 03/12/2021   Bilateral hearing loss 10/19/2020   Peripheral vascular disease 09/20/2020   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   Cardiac arrhythmia 07/30/2019   Anxiety 06/09/2019   Essential hypertension 06/09/2019   Greater trochanteric pain syndrome 06/09/2019    Hardening of the aorta (main artery of the heart) 05/31/2019   Benign neoplasm of colon 05/31/2019   Major depression, single episode 05/31/2019   Other specified diseases of blood and blood-forming organs 05/24/2019   Disorder of bone 05/27/2018   Impacted cerumen 04/16/2017   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HCC) 04/16/2017   Encounter for general adult medical examination without abnormal findings 04/09/2017   Asthma without status asthmaticus 12/03/2016   Migraine 12/03/2016   Carpal tunnel syndrome 01/31/2016   Paresthesia of hand 01/31/2016   Atrophic vaginitis 05/18/2014   Rectocele 05/18/2014   History of stroke 12/29/2013   Sciatica 03/27/2012   Family history of diabetes mellitus 01/13/2012   Idiopathic peripheral neuropathy 01/13/2012   Cerebral infarction (HCC) 11/19/2011   Mixed hyperlipidemia 11/19/2011   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   Unilateral primary osteoarthritis, unspecified knee 11/19/2011   Stroke (HCC) 09/04/2005    PCP: Wendee Lynwood HERO, NP   REFERRING PROVIDER: Watt Mirza, MD  REFERRING DIAG: Diagnosis M79.604 (ICD-10-CM) - Right leg pain M54.9,G89.29 (ICD-10-CM) - Chronic back pain greater than 3 months duration M54.10 (ICD-10-CM) - Bilateral radiating leg pain  Rationale for Evaluation and Treatment: Rehabilitation  THERAPY DIAG:  Radiculopathy, lumbosacral region  Other low back pain  Pain in right thigh  Pain in left thigh  ONSET DATE: October 2025  SUBJECTIVE:                                                                                                                                                                                           SUBJECTIVE STATEMENT: The doctor did not like her blood pressure. B thighs hurt, 5/10 posterior thighs currently when standing and walking.     Going on a cruise December or January for 9 days.    PERTINENT HISTORY:  Low back pain with B LE radiating symptoms. Pain is  located B posterior thighs to knees (along the hamstrings/sciatic nerve pathway. Pain started 2nd week of October 2025, pt walked on a soft sand, was not able to get very far which cased per symptoms. Pain is the same since onset.     Had a hx of CVA L UE 2006.    No latex allergies.     PAIN:  Are you having pain? Yes: NPRS scale: 2/10 Pain location: B posterior hip and thighs, not past knees Pain description: sore, like someone punched her in her thighs.  Aggravating factors: turning over in bed, sit <> stand, walking; standing; stairs; arching her back.  Relieving factors: bending over, sitting  PRECAUTIONS: no known precautions  RED FLAGS: Bowel or bladder incontinence: No and Cauda equina syndrome: No   WEIGHT BEARING RESTRICTIONS: No  FALLS:  Has patient fallen in last 6 months? No  LIVING ENVIRONMENT: Lives with: lives with their spouse Lives in: House/apartment Stairs: Yes: Internal: 16 steps; on left going up Has following equipment at home: None  OCCUPATION: Retired  PLOF: Independent  PATIENT GOALS: Get up and down stairs or anywhere more comfortably  NEXT MD VISIT: yes but pt does know.   OBJECTIVE:  Note: Objective measures were completed at Evaluation unless otherwise noted.  DIAGNOSTIC FINDINGS:    PATIENT SURVEYS:  Modified Oswestry:  MODIFIED OSWESTRY DISABILITY SCALE  Date: 12/16/2023 Score  Pain intensity 2 =  Pain medication provides me with complete relief from pain.  2. Personal care (washing, dressing, etc.) 1 =  I can take care of myself normally, but it increases my pain.  3. Lifting 3 = Pain prevents me from lifting heavy weights, but I can manage light to medium weights if they are conveniently positioned  4. Walking 2 =  Pain prevents me from walking more than  mile.  5. Sitting 1 =  I can only sit in my favorite chair as long as I like.  6. Standing  3 =  Pain prevents me from standing more than 1/2 hour.  7. Sleeping 1 = I can  sleep well only by using pain medication.  8. Social Life 2 = Pain prevents me from participating in more energetic activities (eg. sports, dancing).  9. Traveling 1 =  I can travel anywhere, but it increases my pain.  10. Employment/ Homemaking 3 = Pain prevents me from doing anything but light duties.  Total 19/50 (38%)   Interpretation of scores: Score Category Description  0-20% Minimal Disability The patient can cope with most living activities. Usually no treatment is indicated apart from advice on lifting, sitting and exercise  21-40% Moderate Disability The patient experiences more pain and difficulty with sitting, lifting and standing. Travel and social life are more difficult and they may be disabled from work. Personal care, sexual activity and sleeping are not grossly affected, and the patient can usually be managed by conservative means  41-60% Severe Disability Pain remains the main problem in this group, but activities of daily living are affected. These patients require a detailed investigation  61-80% Crippled Back pain impinges on all aspects of the patient's life. Positive intervention is required  81-100% Bed-bound These patients are either bed-bound or exaggerating their symptoms  Bluford FORBES Zoe DELENA Karon DELENA, et al. Surgery versus conservative management of stable thoracolumbar fracture: the PRESTO feasibility RCT. [place] ([place]): [employer]; 2021 Nov. [facility] Technology Assessment, No. 25.62.) Appendix 3, Oswestry Disability Index category descriptors. Available from: Findjewelers.cz  Minimally Clinically Important Difference (MCID) = 12.8%  COGNITION: Overall cognitive status: Within functional limits for tasks assessed     SENSATION:   MUSCLE LENGTH:   POSTURE: forward neck, thoracic flexion, B protracted shoulders, R trunk side bend, decreased B hip extension  PALPATION:   LUMBAR ROM:   AROM eval  Flexion WFL    Extension WFL with reproduction of symptoms  Right lateral flexion WFL   Left lateral flexion WFL  Right rotation [facility] with R posterior thigh pain (no pain when performed in sitting but with low back pain)  Left rotation WFL with R posterior thigh pain (no pain when performed in sitting)   (Blank rows = not tested)  LOWER EXTREMITY ROM:     Passive  Right eval Left eval  Hip flexion    Hip extension    Hip abduction    Hip adduction    Hip internal rotation (in sitting) 60 51  Hip external rotation (in sitting)  35 42  Knee flexion    Knee extension    Ankle dorsiflexion    Ankle plantarflexion    Ankle inversion    Ankle eversion     (Blank rows = not tested)  LOWER EXTREMITY MMT:    MMT Right eval Left eval  Hip flexion 4 4-  Hip extension (seated manually resisted) 3+ 3  Hip abduction (seated manually resisted) 4- 3+  Hip adduction    Hip internal rotation    Hip external rotation    Knee flexion 4 4-  Knee extension 5 4+  Ankle dorsiflexion    Ankle plantarflexion    Ankle inversion    Ankle eversion     (Blank rows = not tested)  LUMBAR SPECIAL TESTS:  (-) Piriformis test on L and R. Pt states that position feels pretty good.    FUNCTIONAL TESTS:    Decreased B femoral control with sit <> stand    GAIT: Distance walked: 30 ft Assistive device  utilized: None Level of assistance: Complete Independence Comments: antalgic, decreased stance R LE, B genu valgus, decreased B knee extension during stance phase, forward flexed.   TREATMENT DATE: 12/24/2023                                                                                                                                  Manual therapy Seated with slight trunk flexion, forearms resting on thighs   STM B lumbar paraspinal muscles to decrease tension   Neuromuscular re education  Performed to improve posture and core strength to decrease pressure to LE nerves.   At start of session.   Blood pressure L arm sitting, mechanically taken, normal cuff: 159/66, HR 54 Asymptomatic  Standing with slight forward flexion onto elevated mat table to decrease lumbar extension stress  Glute max squeeze 10x5 seconds for 3 sets   Hip abduction    R 10x3   L 10x3   Seated B scapular retraction to promote thoracic extension 10x5 seconds   R posterior thigh symptoms, eases with rest.   Seated transversus abdominis contraction 10x5 seconds   Difficulty activating, mod to max verbal and tactile cues needed.    Improved exercise technique, movement at target joints, use of target muscles after mod verbal, visual, tactile cues.             PATIENT EDUCATION:  Education details: there-ex, HEP Person educated: Patient Education method: Explanation, Demonstration, Tactile cues, Verbal cues, and Handouts Education comprehension: verbalized understanding and returned demonstration  HOME EXERCISE PROGRAM: Access Code: [email or web address] URL: [email or web address] Date: 12/24/2023 Prepared by: Emil Glassman  Exercises - Standing Gluteal Sets  - 2 x daily - 7 x weekly - 3 sets - 10 reps - 5 seconds hold    ASSESSMENT:  CLINICAL IMPRESSION: Worked on glute med, and max and transversus abdominis muscle strengthening as well as thoracic extension to decrease extension stress to low back nerves to B LE's. Difficulty activating her transversus abdominis muscles, needing cues to do so. Also performed STM to decrease B lumbar paraspinal muscle tension to low back. Decreased L posterior thigh symptoms after session. R posterior thigh symptoms the same. Pt will benefit from continued skilled physical therapy services to decrease pain, improve strength and function.      OBJECTIVE IMPAIRMENTS: decreased strength, improper body mechanics, postural dysfunction, and pain.   ACTIVITY LIMITATIONS: standing, stairs, transfers, bed mobility, and locomotion level  PARTICIPATION  LIMITATIONS:   PERSONAL FACTORS: Age, Fitness, and 3+ comorbidities: CVA, depression, HTN, Morton's Neuroma are also affecting patient's functional outcome.   REHAB POTENTIAL: Fair    CLINICAL DECISION MAKING: Stable/uncomplicated  EVALUATION COMPLEXITY: Low   GOALS: Goals reviewed with patient? Yes  SHORT TERM GOALS: Target date: 12/26/2023  Pt will be independent with her initial HEP to decrease pain, improve strength, function, and ability to perform bed mobility, standing tasks, transfers, and stair negotiation more comfortably for her back.  Baseline: Pt has not yet started her HEP (12/16/2023) Goal status: INITIAL   LONG TERM GOALS: Target date: 02/13/2024  Pt will have a decrease in B posterior hip and thigh pain to 2/10 or less at worst to promote ability to perform bed mobility, transfers, standing tasks, and stair negotiation more comfortably.  Baseline: B posterior hip and thighs: 10/10 at worst (at time of onset) (12/16/2023) Goal status: INITIAL  2.  Pt will improve B hip extension and abduction strength by at least 1/2 MMT grade to promote  ability to perform bed mobility, transfers, standing tasks, and stair negotiation more comfortably.  Baseline:  MMT Right eval Left eval  Hip extension (seated manually resisted) 3+ 3  Hip abduction (seated manually resisted) 4- 3+   Goal status: INITIAL  3.  Pt will improve her Modified Oswestery Low Back Pain disability Questionnaire by at least 10 % as a demonstration of improved function.  Baseline: 38% (12/16/2023) Goal status: INITIAL   PLAN:  PT FREQUENCY: 1-2x/week  PT DURATION: 8 weeks  PLANNED INTERVENTIONS: 97110-Therapeutic exercises, 97530- Therapeutic activity, 97112- Neuromuscular re-education, 97535- Self Care, 02859- Manual therapy, [id number]- Electrical stimulation (unattended), [phone]- Traction (mechanical), [id number]- Ionotophoresis 4mg /ml Dexamethasone, 79439 (1-2 muscles), 20561 (3+ muscles)- Dry Needling,  Patient/Family education, Balance training, Stair training, Joint mobilization, and Spinal mobilization.  PLAN FOR NEXT SESSION: Posture, trunk and glute strengthening, femoral control, manual techniques, modalities PRN.    Jessicca Stitzer, PT, DPT 12/24/2023, 4:27 PM

## 2023-12-29 ENCOUNTER — Ambulatory Visit
Admission: RE | Admit: 2023-12-29 | Discharge: 2023-12-29 | Disposition: A | Discharge status: Home / Self Care | Source: Ambulatory Visit | Attending: Nurse Practitioner | Admitting: Nurse Practitioner

## 2023-12-29 ENCOUNTER — Ambulatory Visit

## 2023-12-29 DIAGNOSIS — M5459 Other low back pain: Secondary | ICD-10-CM

## 2023-12-29 DIAGNOSIS — M5417 Radiculopathy, lumbosacral region: Secondary | ICD-10-CM

## 2023-12-29 DIAGNOSIS — M810 Age-related osteoporosis without current pathological fracture: Secondary | ICD-10-CM | POA: Insufficient documentation

## 2023-12-29 DIAGNOSIS — M79652 Pain in left thigh: Secondary | ICD-10-CM

## 2023-12-29 DIAGNOSIS — Z1231 Encounter for screening mammogram for malignant neoplasm of breast: Secondary | ICD-10-CM | POA: Diagnosis present

## 2023-12-29 DIAGNOSIS — M79651 Pain in right thigh: Secondary | ICD-10-CM

## 2023-12-29 NOTE — Therapy (Signed)
 OUTPATIENT PHYSICAL THERAPY TREATMENT   Patient Name: RODNEISHA BONNET MRN: [id number] DOB:[birth year], 78 y.o., female Today's Date: 12/29/2023  END OF SESSION:  PT End of Session - 12/29/23 1347     Visit Number 3    Number of Visits 17    Date for Recertification  02/13/24    PT Start Time 1347    PT Stop Time 1420    PT Time Calculation (min) 33 min    Activity Tolerance Patient tolerated treatment well    Behavior During Therapy WFL for tasks assessed/performed            Past Medical History:  Diagnosis Date   Anxiety    Aortic atherosclerosis    Arthritis    Benign neoplasm of colon, unspecified    Colon polyp    CVA (cerebral vascular accident) (HCC) 2006   Depression    High cholesterol    Hyperlipidemia    Hyperplastic colon polyp    Hypertension    Hypothyroid    surgical   IGT (impaired glucose tolerance)    Melanoma (HCC)    Migraines    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    OA (osteoarthritis)    PVD (peripheral vascular disease)    Rectocele    Shingles    Past Surgical History:  Procedure Laterality Date   ABDOMINAL HYSTERECTOMY     BREAST BIOPSY Right    X 2   BREAST BIOPSY Left    BREAST SURGERY     BX breast lumps X 3   CATARACT EXTRACTION, BILATERAL     COLONOSCOPY     PARTIAL HYSTERECTOMY     REPLACEMENT TOTAL KNEE Bilateral    RIGHT WRIST TENDON     THYROIDECTOMY     Patient Active Problem List   Diagnosis Date Noted   Syncope and collapse 02/12/2023   GAD (generalized anxiety disorder) 12/31/2022   Hyponatremia 11/01/2022   Memory impairment 10/04/2022   Former smoker 10/04/2022   Unintentional weight loss 10/04/2022   Hypothyroidism 03/12/2021   Bilateral hearing loss 10/19/2020   Peripheral vascular disease 09/20/2020   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   Cardiac arrhythmia 07/30/2019   Anxiety 06/09/2019   Essential hypertension 06/09/2019   Greater trochanteric pain syndrome 06/09/2019    Hardening of the aorta (main artery of the heart) 05/31/2019   Benign neoplasm of colon 05/31/2019   Major depression, single episode 05/31/2019   Other specified diseases of blood and blood-forming organs 05/24/2019   Disorder of bone 05/27/2018   Impacted cerumen 04/16/2017   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HCC) 04/16/2017   Encounter for general adult medical examination without abnormal findings 04/09/2017   Asthma without status asthmaticus 12/03/2016   Migraine 12/03/2016   Carpal tunnel syndrome 01/31/2016   Paresthesia of hand 01/31/2016   Atrophic vaginitis 05/18/2014   Rectocele 05/18/2014   History of stroke 12/29/2013   Sciatica 03/27/2012   Family history of diabetes mellitus 01/13/2012   Idiopathic peripheral neuropathy 01/13/2012   Cerebral infarction (HCC) 11/19/2011   Mixed hyperlipidemia 11/19/2011   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   Unilateral primary osteoarthritis, unspecified knee 11/19/2011   Stroke (HCC) 09/04/2005    PCP: Wendee Lynwood HERO, NP   REFERRING PROVIDER: Watt Mirza, MD  REFERRING DIAG: Diagnosis M79.604 (ICD-10-CM) - Right leg pain M54.9,G89.29 (ICD-10-CM) - Chronic back pain greater than 3 months duration M54.10 (ICD-10-CM) - Bilateral radiating leg pain  Rationale for Evaluation and Treatment: Rehabilitation  THERAPY  DIAG:  Radiculopathy, lumbosacral region  Other low back pain  Pain in right thigh  Pain in left thigh  ONSET DATE: October 2025  SUBJECTIVE:                                                                                                                                                                                           SUBJECTIVE STATEMENT: Lots of stress, had to take husband to hospital. No back pain currently but has pain in both thigs.      Going on a cruise December or January for 9 days.    PERTINENT HISTORY:  Low back pain with B LE radiating symptoms. Pain is located B posterior  thighs to knees (along the hamstrings/sciatic nerve pathway. Pain started 2nd week of October 2025, pt walked on a soft sand, was not able to get very far which cased per symptoms. Pain is the same since onset.     Had a hx of CVA L UE 2006.    No latex allergies.     PAIN:  Are you having pain? Yes: NPRS scale: 2/10 Pain location: B posterior hip and thighs, not past knees Pain description: sore, like someone punched her in her thighs.  Aggravating factors: turning over in bed, sit <> stand, walking; standing; stairs; arching her back.  Relieving factors: bending over, sitting  PRECAUTIONS: no known precautions  RED FLAGS: Bowel or bladder incontinence: No and Cauda equina syndrome: No   WEIGHT BEARING RESTRICTIONS: No  FALLS:  Has patient fallen in last 6 months? No  LIVING ENVIRONMENT: Lives with: lives with their spouse Lives in: House/apartment Stairs: Yes: Internal: 16 steps; on left going up Has following equipment at home: None  OCCUPATION: Retired  PLOF: Independent  PATIENT GOALS: Get up and down stairs or anywhere more comfortably  NEXT MD VISIT: yes but pt does know.   OBJECTIVE:  Note: Objective measures were completed at Evaluation unless otherwise noted.  DIAGNOSTIC FINDINGS:    PATIENT SURVEYS:  Modified Oswestry:  MODIFIED OSWESTRY DISABILITY SCALE  Date: 12/16/2023 Score  Pain intensity 2 =  Pain medication provides me with complete relief from pain.  2. Personal care (washing, dressing, etc.) 1 =  I can take care of myself normally, but it increases my pain.  3. Lifting 3 = Pain prevents me from lifting heavy weights, but I can manage light to medium weights if they are conveniently positioned  4. Walking 2 =  Pain prevents me from walking more than  mile.  5. Sitting 1 =  I can only sit in my favorite chair as long as I like.  6. Standing 3 =  Pain prevents me from standing more than 1/2 hour.  7. Sleeping 1 = I can sleep well only by  using pain medication.  8. Social Life 2 = Pain prevents me from participating in more energetic activities (eg. sports, dancing).  9. Traveling 1 =  I can travel anywhere, but it increases my pain.  10. Employment/ Homemaking 3 = Pain prevents me from doing anything but light duties.  Total 19/50 (38%)   Interpretation of scores: Score Category Description  0-20% Minimal Disability The patient can cope with most living activities. Usually no treatment is indicated apart from advice on lifting, sitting and exercise  21-40% Moderate Disability The patient experiences more pain and difficulty with sitting, lifting and standing. Travel and social life are more difficult and they may be disabled from work. Personal care, sexual activity and sleeping are not grossly affected, and the patient can usually be managed by conservative means  41-60% Severe Disability Pain remains the main problem in this group, but activities of daily living are affected. These patients require a detailed investigation  61-80% Crippled Back pain impinges on all aspects of the patient's life. Positive intervention is required  81-100% Bed-bound These patients are either bed-bound or exaggerating their symptoms  Bluford FORBES Zoe DELENA Karon DELENA, et al. Surgery versus conservative management of stable thoracolumbar fracture: the PRESTO feasibility RCT. [place] ([place]): [employer]; 2021 Nov. [facility] Technology Assessment, No. 25.62.) Appendix 3, Oswestry Disability Index category descriptors. Available from: Findjewelers.cz  Minimally Clinically Important Difference (MCID) = 12.8%  COGNITION: Overall cognitive status: Within functional limits for tasks assessed     SENSATION:   MUSCLE LENGTH:   POSTURE: forward neck, thoracic flexion, B protracted shoulders, R trunk side bend, decreased B hip extension  PALPATION:   LUMBAR ROM:   AROM eval  Flexion WFL   Extension WFL with  reproduction of symptoms  Right lateral flexion WFL   Left lateral flexion WFL  Right rotation [facility] with R posterior thigh pain (no pain when performed in sitting but with low back pain)  Left rotation WFL with R posterior thigh pain (no pain when performed in sitting)   (Blank rows = not tested)  LOWER EXTREMITY ROM:     Passive  Right eval Left eval  Hip flexion    Hip extension    Hip abduction    Hip adduction    Hip internal rotation (in sitting) 60 51  Hip external rotation (in sitting)  35 42  Knee flexion    Knee extension    Ankle dorsiflexion    Ankle plantarflexion    Ankle inversion    Ankle eversion     (Blank rows = not tested)  LOWER EXTREMITY MMT:    MMT Right eval Left eval  Hip flexion 4 4-  Hip extension (seated manually resisted) 3+ 3  Hip abduction (seated manually resisted) 4- 3+  Hip adduction    Hip internal rotation    Hip external rotation    Knee flexion 4 4-  Knee extension 5 4+  Ankle dorsiflexion    Ankle plantarflexion    Ankle inversion    Ankle eversion     (Blank rows = not tested)  LUMBAR SPECIAL TESTS:  (-) Piriformis test on L and R. Pt states that position feels pretty good.    FUNCTIONAL TESTS:    Decreased B femoral control with sit <> stand    GAIT: Distance walked: 30 ft  Assistive device utilized: None Level of assistance: Complete Independence Comments: antalgic, decreased stance R LE, B genu valgus, decreased B knee extension during stance phase, forward flexed.   TREATMENT DATE: 12/29/2023                                                                                                                               Manual therapy  Seated with trunk flexion,onto table  STM B lumbar paraspinal muscles to decrease tension   No B thigh pain afterwards in sitting.    Neuromuscular re education  Performed to improve posture and core strength to decrease pressure to LE nerves.   At start of session.  Blood  pressure L arm sitting, mechanically taken, normal cuff: 174/70, HR 55 Asymptomatic  After manual therapy: Blood pressure L arm sitting, mechanically taken, normal cuff: 166/71, HR 51 Asymptomatic  Standing with slight forward flexion onto elevated mat table to decrease lumbar extension stress    Hip abduction    R 10x3   L 10x3  Seated transversus abdominis contraction 5x5 seconds for 3 sets    Seated B scapular retraction to promote thoracic extension 10x5 seconds for 2 sets  No thigh symptoms today during exercise   Improved exercise technique, movement at target joints, use of target muscles after mod verbal, visual, tactile cues.             PATIENT EDUCATION:  Education details: there-ex, HEP Person educated: Patient Education method: Explanation, Demonstration, Tactile cues, Verbal cues, and Handouts Education comprehension: verbalized understanding and returned demonstration  HOME EXERCISE PROGRAM: Access Code: [email or web address] URL: [email or web address] Date: 12/29/2023 Prepared by: Emil Glassman  Exercises - Standing Gluteal Sets  - 2 x daily - 7 x weekly - 3 sets - 10 reps - 5 seconds hold - Standing Hip Abduction with Counter Support  - 1 x daily - 4 x weekly - 3 sets - 10 reps    ASSESSMENT:  CLINICAL IMPRESSION: Continued working on  glute med, and max and transversus abdominis muscle strengthening as well as thoracic extension to decrease extension stress to low back nerves to B LE's.  Also performed STM to decrease B lumbar paraspinal muscle tension to low back. Pt tolerated session well without aggravation of symptoms. Pt will benefit from continued skilled physical therapy services to decrease pain, improve strength and function.      OBJECTIVE IMPAIRMENTS: decreased strength, improper body mechanics, postural dysfunction, and pain.   ACTIVITY LIMITATIONS: standing, stairs, transfers, bed mobility, and locomotion level  PARTICIPATION  LIMITATIONS:   PERSONAL FACTORS: Age, Fitness, and 3+ comorbidities: CVA, depression, HTN, Morton's Neuroma are also affecting patient's functional outcome.   REHAB POTENTIAL: Fair    CLINICAL DECISION MAKING: Stable/uncomplicated  EVALUATION COMPLEXITY: Low   GOALS: Goals reviewed with patient? Yes  SHORT TERM GOALS: Target date: 12/26/2023  Pt will be independent with her initial HEP to decrease pain, improve strength, function, and ability to perform  bed mobility, standing tasks, transfers, and stair negotiation more comfortably for her back.  Baseline: Pt has not yet started her HEP (12/16/2023) Goal status: INITIAL   LONG TERM GOALS: Target date: 02/13/2024  Pt will have a decrease in B posterior hip and thigh pain to 2/10 or less at worst to promote ability to perform bed mobility, transfers, standing tasks, and stair negotiation more comfortably.  Baseline: B posterior hip and thighs: 10/10 at worst (at time of onset) (12/16/2023) Goal status: INITIAL  2.  Pt will improve B hip extension and abduction strength by at least 1/2 MMT grade to promote  ability to perform bed mobility, transfers, standing tasks, and stair negotiation more comfortably.  Baseline:  MMT Right eval Left eval  Hip extension (seated manually resisted) 3+ 3  Hip abduction (seated manually resisted) 4- 3+   Goal status: INITIAL  3.  Pt will improve her Modified Oswestery Low Back Pain disability Questionnaire by at least 10 % as a demonstration of improved function.  Baseline: 38% (12/16/2023) Goal status: INITIAL   PLAN:  PT FREQUENCY: 1-2x/week  PT DURATION: 8 weeks  PLANNED INTERVENTIONS: 97110-Therapeutic exercises, 97530- Therapeutic activity, 97112- Neuromuscular re-education, 97535- Self Care, 02859- Manual therapy, G0283- Electrical stimulation (unattended), [phone]- Traction (mechanical), [id number]- Ionotophoresis 4mg /ml Dexamethasone, 79439 (1-2 muscles), 20561 (3+ muscles)- Dry Needling,  Patient/Family education, Balance training, Stair training, Joint mobilization, and Spinal mobilization.  PLAN FOR NEXT SESSION: Posture, trunk and glute strengthening, femoral control, manual techniques, modalities PRN.    Kaycie Pegues, PT, DPT 12/29/2023, 2:26 PM

## 2023-12-30 ENCOUNTER — Encounter: Payer: Self-pay | Admitting: Cardiovascular Disease

## 2023-12-31 ENCOUNTER — Ambulatory Visit

## 2023-12-31 ENCOUNTER — Telehealth: Payer: Self-pay | Admitting: Family Medicine

## 2023-12-31 DIAGNOSIS — M5459 Other low back pain: Secondary | ICD-10-CM

## 2023-12-31 DIAGNOSIS — M5417 Radiculopathy, lumbosacral region: Secondary | ICD-10-CM | POA: Diagnosis not present

## 2023-12-31 DIAGNOSIS — M79652 Pain in left thigh: Secondary | ICD-10-CM

## 2023-12-31 DIAGNOSIS — M79651 Pain in right thigh: Secondary | ICD-10-CM

## 2023-12-31 NOTE — Telephone Encounter (Signed)
 Spoke with pt's daughter in social worker, Randine. I have gone over how for the pt to take methocarbamol . Advised her that Dr. Watt changed her to this from tizanidine  due to the pt not finding tizanidine  helpful. Nothing further was needed.

## 2023-12-31 NOTE — Telephone Encounter (Signed)
 Copied from CRM [id number]. Topic: General - Other >> Dec 31, 2023 11:51 AM Rosina BIRCH wrote: Reason for CRM: patient daughter n law called wanting to know how to administer the patient muscle relaxer because it says as needed. Patient daughter n law organizes all of the patient medication Methocarbamol  and tizanidine   [phone]

## 2023-12-31 NOTE — Therapy (Signed)
 OUTPATIENT PHYSICAL THERAPY TREATMENT   Patient Name: Rachel Donovan MRN: [id number] DOB:1945/09/15, 78 y.o., female Today's Date: 12/31/2023  END OF SESSION:  PT End of Session - 12/31/23 1516     Visit Number 4    Number of Visits 17    Date for Recertification  02/13/24    PT Start Time 1516    PT Stop Time 1600    PT Time Calculation (min) 44 min    Activity Tolerance Patient tolerated treatment well    Behavior During Therapy WFL for tasks assessed/performed             Past Medical History:  Diagnosis Date   Anxiety    Aortic atherosclerosis    Arthritis    Benign neoplasm of colon, unspecified    Colon polyp    CVA (cerebral vascular accident) (HCC) 2006   Depression    High cholesterol    Hyperlipidemia    Hyperplastic colon polyp    Hypertension    Hypothyroid    surgical   IGT (impaired glucose tolerance)    Melanoma (HCC)    Migraines    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    OA (osteoarthritis)    PVD (peripheral vascular disease)    Rectocele    Shingles    Past Surgical History:  Procedure Laterality Date   ABDOMINAL HYSTERECTOMY     BREAST BIOPSY Right    X 2   BREAST BIOPSY Left    BREAST SURGERY     BX breast lumps X 3   CATARACT EXTRACTION, BILATERAL     COLONOSCOPY     PARTIAL HYSTERECTOMY     REPLACEMENT TOTAL KNEE Bilateral    RIGHT WRIST TENDON     THYROIDECTOMY     Patient Active Problem List   Diagnosis Date Noted   Syncope and collapse 02/12/2023   GAD (generalized anxiety disorder) 12/31/2022   Hyponatremia 11/01/2022   Memory impairment 10/04/2022   Former smoker 10/04/2022   Unintentional weight loss 10/04/2022   Hypothyroidism 03/12/2021   Bilateral hearing loss 10/19/2020   Peripheral vascular disease 09/20/2020   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   Cardiac arrhythmia 07/30/2019   Anxiety 06/09/2019   Essential hypertension 06/09/2019   Greater trochanteric pain syndrome 06/09/2019    Hardening of the aorta (main artery of the heart) 05/31/2019   Benign neoplasm of colon 05/31/2019   Major depression, single episode 05/31/2019   Other specified diseases of blood and blood-forming organs 05/24/2019   Disorder of bone 05/27/2018   Impacted cerumen 04/16/2017   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HCC) 04/16/2017   Encounter for general adult medical examination without abnormal findings 04/09/2017   Asthma without status asthmaticus 12/03/2016   Migraine 12/03/2016   Carpal tunnel syndrome 01/31/2016   Paresthesia of hand 01/31/2016   Atrophic vaginitis 05/18/2014   Rectocele 05/18/2014   History of stroke 12/29/2013   Sciatica 03/27/2012   Family history of diabetes mellitus 01/13/2012   Idiopathic peripheral neuropathy 01/13/2012   Cerebral infarction (HCC) 11/19/2011   Mixed hyperlipidemia 11/19/2011   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   Unilateral primary osteoarthritis, unspecified knee 11/19/2011   Stroke (HCC) 09/04/2005    PCP: Wendee Lynwood HERO, NP   REFERRING PROVIDER: Watt Mirza, MD  REFERRING DIAG: Diagnosis M79.604 (ICD-10-CM) - Right leg pain M54.9,G89.29 (ICD-10-CM) - Chronic back pain greater than 3 months duration M54.10 (ICD-10-CM) - Bilateral radiating leg pain  Rationale for Evaluation and Treatment: Rehabilitation  THERAPY DIAG:  Radiculopathy, lumbosacral region  Other low back pain  Pain in right thigh  Pain in left thigh  ONSET DATE: October 2025  SUBJECTIVE:                                                                                                                                                                                           SUBJECTIVE STATEMENT:  Low back is good, B posterior thighs bother her a little, 4/10 currently.      Going on a cruise December or January for 9 days.    PERTINENT HISTORY:  Low back pain with B LE radiating symptoms. Pain is located B posterior thighs to knees (along  the hamstrings/sciatic nerve pathway. Pain started 2nd week of October 2025, pt walked on a soft sand, was not able to get very far which cased per symptoms. Pain is the same since onset.     Had a hx of CVA L UE 2006.    No latex allergies.     PAIN:  Are you having pain? Yes: NPRS scale: 2/10 Pain location: B posterior hip and thighs, not past knees Pain description: sore, like someone punched her in her thighs.  Aggravating factors: turning over in bed, sit <> stand, walking; standing; stairs; arching her back.  Relieving factors: bending over, sitting  PRECAUTIONS: no known precautions  RED FLAGS: Bowel or bladder incontinence: No and Cauda equina syndrome: No   WEIGHT BEARING RESTRICTIONS: No  FALLS:  Has patient fallen in last 6 months? No  LIVING ENVIRONMENT: Lives with: lives with their spouse Lives in: House/apartment Stairs: Yes: Internal: 16 steps; on left going up Has following equipment at home: None  OCCUPATION: Retired  PLOF: Independent  PATIENT GOALS: Get up and down stairs or anywhere more comfortably  NEXT MD VISIT: yes but pt does know.   OBJECTIVE:  Note: Objective measures were completed at Evaluation unless otherwise noted.  DIAGNOSTIC FINDINGS:    PATIENT SURVEYS:  Modified Oswestry:  MODIFIED OSWESTRY DISABILITY SCALE  Date: 12/16/2023 Score  Pain intensity 2 =  Pain medication provides me with complete relief from pain.  2. Personal care (washing, dressing, etc.) 1 =  I can take care of myself normally, but it increases my pain.  3. Lifting 3 = Pain prevents me from lifting heavy weights, but I can manage light to medium weights if they are conveniently positioned  4. Walking 2 =  Pain prevents me from walking more than  mile.  5. Sitting 1 =  I can only sit in my favorite chair as long as I like.  6. Standing 3 =  Pain prevents me from standing more than 1/2 hour.  7. Sleeping 1 = I can sleep well only by using pain medication.   8. Social Life 2 = Pain prevents me from participating in more energetic activities (eg. sports, dancing).  9. Traveling 1 =  I can travel anywhere, but it increases my pain.  10. Employment/ Homemaking 3 = Pain prevents me from doing anything but light duties.  Total 19/50 (38%)   Interpretation of scores: Score Category Description  0-20% Minimal Disability The patient can cope with most living activities. Usually no treatment is indicated apart from advice on lifting, sitting and exercise  21-40% Moderate Disability The patient experiences more pain and difficulty with sitting, lifting and standing. Travel and social life are more difficult and they may be disabled from work. Personal care, sexual activity and sleeping are not grossly affected, and the patient can usually be managed by conservative means  41-60% Severe Disability Pain remains the main problem in this group, but activities of daily living are affected. These patients require a detailed investigation  61-80% Crippled Back pain impinges on all aspects of the patient's life. Positive intervention is required  81-100% Bed-bound These patients are either bed-bound or exaggerating their symptoms  Bluford FORBES Zoe DELENA Karon DELENA, et al. Surgery versus conservative management of stable thoracolumbar fracture: the PRESTO feasibility RCT. [place] ([place]): [employer]; 2021 Nov. [facility] Technology Assessment, No. 25.62.) Appendix 3, Oswestry Disability Index category descriptors. Available from: Findjewelers.cz  Minimally Clinically Important Difference (MCID) = 12.8%  COGNITION: Overall cognitive status: Within functional limits for tasks assessed     SENSATION:   MUSCLE LENGTH:   POSTURE: forward neck, thoracic flexion, B protracted shoulders, R trunk side bend, decreased B hip extension  PALPATION:   LUMBAR ROM:   AROM eval  Flexion WFL   Extension WFL with reproduction of  symptoms  Right lateral flexion WFL   Left lateral flexion WFL  Right rotation [facility] with R posterior thigh pain (no pain when performed in sitting but with low back pain)  Left rotation WFL with R posterior thigh pain (no pain when performed in sitting)   (Blank rows = not tested)  LOWER EXTREMITY ROM:     Passive  Right eval Left eval  Hip flexion    Hip extension    Hip abduction    Hip adduction    Hip internal rotation (in sitting) 60 51  Hip external rotation (in sitting)  35 42  Knee flexion    Knee extension    Ankle dorsiflexion    Ankle plantarflexion    Ankle inversion    Ankle eversion     (Blank rows = not tested)  LOWER EXTREMITY MMT:    MMT Right eval Left eval  Hip flexion 4 4-  Hip extension (seated manually resisted) 3+ 3  Hip abduction (seated manually resisted) 4- 3+  Hip adduction    Hip internal rotation    Hip external rotation    Knee flexion 4 4-  Knee extension 5 4+  Ankle dorsiflexion    Ankle plantarflexion    Ankle inversion    Ankle eversion     (Blank rows = not tested)  LUMBAR SPECIAL TESTS:  (-) Piriformis test on L and R. Pt states that position feels pretty good.    FUNCTIONAL TESTS:    Decreased B femoral control with sit <> stand    GAIT: Distance walked: 30 ft Assistive device utilized: None Level  of assistance: Complete Independence Comments: antalgic, decreased stance R LE, B genu valgus, decreased B knee extension during stance phase, forward flexed.   TREATMENT DATE: 12/31/2023                                                                                                                                 Neuromuscular re education  Performed to improve posture and core strength to decrease pressure to LE nerves.   At start of session.  Blood pressure L arm sitting, mechanically taken, normal cuff: 157/63, HR 57  Sitting transversus abdominis contraction 10x3 with 5 seconds   Sit <> stand with B UE assist,  emphasis on femoral control 5x2  Sitting on mat table  Hip ER   R 8x5 seconds for 2 sets   L 8x5 seconds for 2 sets    Hip adduction isometrics, small blue ball squeeze 5x3 with 5 second holds   Standing with slight forward flexion onto elevated mat table to decrease lumbar extension stress  Hip extension   R 10x2   L 10x2   Hip abduction    R 10x   L 10x  Seated B scapular retraction to promote thoracic extension 10x5 seconds for 2 sets  B posterior thigh symptoms with addition of chin tuck to promote thoracic extension, decreased with rest as well as with feet propped on stool    Standing B shoulder extension yellow band 5x   B posterior thigh symptoms  Standing posterior pelvic tilt 10x5 seconds    Improved exercise technique, movement at target joints, use of target muscles after mod verbal, visual, tactile cues.             PATIENT EDUCATION:  Education details: there-ex, HEP Person educated: Patient Education method: Explanation, Demonstration, Tactile cues, Verbal cues, and Handouts Education comprehension: verbalized understanding and returned demonstration  HOME EXERCISE PROGRAM: Access Code: [email or web address] URL: [email or web address] Date: 12/29/2023 Prepared by: Emil Glassman  Exercises - Standing Gluteal Sets  - 2 x daily - 7 x weekly - 3 sets - 10 reps - 5 seconds hold - Standing Hip Abduction with Counter Support  - 1 x daily - 4 x weekly - 3 sets - 10 reps    ASSESSMENT:  CLINICAL IMPRESSION: Continued working on  glute med, and max and transversus abdominis muscle strengthening as well as thoracic extension and posterior pelvic tilt to decrease extension stress to low back nerves to B LE's.  No pain reported after session. Pt will benefit from continued skilled physical therapy services to decrease pain, improve strength and function.      OBJECTIVE IMPAIRMENTS: decreased strength, improper body mechanics, postural dysfunction,  and pain.   ACTIVITY LIMITATIONS: standing, stairs, transfers, bed mobility, and locomotion level  PARTICIPATION LIMITATIONS:   PERSONAL FACTORS: Age, Fitness, and 3+ comorbidities: CVA, depression, HTN, Morton's Neuroma are also affecting patient's functional outcome.   REHAB POTENTIAL: Fair    CLINICAL  DECISION MAKING: Stable/uncomplicated  EVALUATION COMPLEXITY: Low   GOALS: Goals reviewed with patient? Yes  SHORT TERM GOALS: Target date: 12/26/2023  Pt will be independent with her initial HEP to decrease pain, improve strength, function, and ability to perform bed mobility, standing tasks, transfers, and stair negotiation more comfortably for her back.  Baseline: Pt has not yet started her HEP (12/16/2023) Goal status: INITIAL   LONG TERM GOALS: Target date: 02/13/2024  Pt will have a decrease in B posterior hip and thigh pain to 2/10 or less at worst to promote ability to perform bed mobility, transfers, standing tasks, and stair negotiation more comfortably.  Baseline: B posterior hip and thighs: 10/10 at worst (at time of onset) (12/16/2023) Goal status: INITIAL  2.  Pt will improve B hip extension and abduction strength by at least 1/2 MMT grade to promote  ability to perform bed mobility, transfers, standing tasks, and stair negotiation more comfortably.  Baseline:  MMT Right eval Left eval  Hip extension (seated manually resisted) 3+ 3  Hip abduction (seated manually resisted) 4- 3+   Goal status: INITIAL  3.  Pt will improve her Modified Oswestery Low Back Pain disability Questionnaire by at least 10 % as a demonstration of improved function.  Baseline: 38% (12/16/2023) Goal status: INITIAL   PLAN:  PT FREQUENCY: 1-2x/week  PT DURATION: 8 weeks  PLANNED INTERVENTIONS: 97110-Therapeutic exercises, 97530- Therapeutic activity, 97112- Neuromuscular re-education, 97535- Self Care, 02859- Manual therapy, [id number]- Electrical stimulation (unattended), [id number]  Traction (mechanical), [id number]- Ionotophoresis 4mg /ml Dexamethasone, 79439 (1-2 muscles), 20561 (3+ muscles)- Dry Needling, Patient/Family education, Balance training, Stair training, Joint mobilization, and Spinal mobilization.  PLAN FOR NEXT SESSION: Posture, trunk and glute strengthening, femoral control, manual techniques, modalities PRN.    Zacariah Belue, PT, DPT 12/31/2023, 4:11 PM

## 2024-01-05 ENCOUNTER — Other Ambulatory Visit: Payer: Self-pay | Admitting: Nurse Practitioner

## 2024-01-05 ENCOUNTER — Ambulatory Visit: Attending: Family Medicine

## 2024-01-05 DIAGNOSIS — M79651 Pain in right thigh: Secondary | ICD-10-CM | POA: Insufficient documentation

## 2024-01-05 DIAGNOSIS — R928 Other abnormal and inconclusive findings on diagnostic imaging of breast: Secondary | ICD-10-CM

## 2024-01-05 DIAGNOSIS — M5417 Radiculopathy, lumbosacral region: Secondary | ICD-10-CM | POA: Insufficient documentation

## 2024-01-05 DIAGNOSIS — M5459 Other low back pain: Secondary | ICD-10-CM | POA: Insufficient documentation

## 2024-01-05 DIAGNOSIS — M79652 Pain in left thigh: Secondary | ICD-10-CM | POA: Insufficient documentation

## 2024-01-05 NOTE — Therapy (Signed)
 OUTPATIENT PHYSICAL THERAPY TREATMENT   Patient Name: Rachel Donovan MRN: [id number] DOB:May 25, 1945, 78 y.o., female Today's Date: 01/05/2024  END OF SESSION:  PT End of Session - 01/05/24 1303     Visit Number 5    Number of Visits 17    Date for Recertification  02/13/24    PT Start Time 1303    PT Stop Time 1345    PT Time Calculation (min) 42 min    Activity Tolerance Patient tolerated treatment well    Behavior During Therapy WFL for tasks assessed/performed              Past Medical History:  Diagnosis Date   Anxiety    Aortic atherosclerosis    Arthritis    Benign neoplasm of colon, unspecified    Colon polyp    CVA (cerebral vascular accident) (HCC) 2006   Depression    High cholesterol    Hyperlipidemia    Hyperplastic colon polyp    Hypertension    Hypothyroid    surgical   IGT (impaired glucose tolerance)    Melanoma (HCC)    Migraines    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    OA (osteoarthritis)    PVD (peripheral vascular disease)    Rectocele    Shingles    Past Surgical History:  Procedure Laterality Date   ABDOMINAL HYSTERECTOMY     BREAST BIOPSY Right    X 2   BREAST BIOPSY Left    BREAST SURGERY     BX breast lumps X 3   CATARACT EXTRACTION, BILATERAL     COLONOSCOPY     PARTIAL HYSTERECTOMY     REPLACEMENT TOTAL KNEE Bilateral    RIGHT WRIST TENDON     THYROIDECTOMY     Patient Active Problem List   Diagnosis Date Noted   Syncope and collapse 02/12/2023   GAD (generalized anxiety disorder) 12/31/2022   Hyponatremia 11/01/2022   Memory impairment 10/04/2022   Former smoker 10/04/2022   Unintentional weight loss 10/04/2022   Hypothyroidism 03/12/2021   Bilateral hearing loss 10/19/2020   Peripheral vascular disease 09/20/2020   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   Cardiac arrhythmia 07/30/2019   Anxiety 06/09/2019   Essential hypertension 06/09/2019   Greater trochanteric pain syndrome 06/09/2019    Hardening of the aorta (main artery of the heart) 05/31/2019   Benign neoplasm of colon 05/31/2019   Major depression, single episode 05/31/2019   Other specified diseases of blood and blood-forming organs 05/24/2019   Disorder of bone 05/27/2018   Impacted cerumen 04/16/2017   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HCC) 04/16/2017   Encounter for general adult medical examination without abnormal findings 04/09/2017   Asthma without status asthmaticus 12/03/2016   Migraine 12/03/2016   Carpal tunnel syndrome 01/31/2016   Paresthesia of hand 01/31/2016   Atrophic vaginitis 05/18/2014   Rectocele 05/18/2014   History of stroke 12/29/2013   Sciatica 03/27/2012   Family history of diabetes mellitus 01/13/2012   Idiopathic peripheral neuropathy 01/13/2012   Cerebral infarction (HCC) 11/19/2011   Mixed hyperlipidemia 11/19/2011   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   Unilateral primary osteoarthritis, unspecified knee 11/19/2011   Stroke (HCC) 09/04/2005    PCP: Wendee Lynwood HERO, NP   REFERRING PROVIDER: Watt Mirza, MD  REFERRING DIAG: Diagnosis M79.604 (ICD-10-CM) - Right leg pain M54.9,G89.29 (ICD-10-CM) - Chronic back pain greater than 3 months duration M54.10 (ICD-10-CM) - Bilateral radiating leg pain  Rationale for Evaluation and Treatment: Rehabilitation  THERAPY DIAG:  Radiculopathy, lumbosacral region  Other low back pain  Pain in right thigh  Pain in left thigh  ONSET DATE: October 2025  SUBJECTIVE:                                                                                                                                                                                           SUBJECTIVE STATEMENT:  Back is ok, the R and L posterior thighs bothered her this morning. No pain currently. Usually worse first thing in the morning. Has to bend over to walk.    Had to cancel her cruise because her husband had a seizure.     PERTINENT HISTORY:  Low  back pain with B LE radiating symptoms. Pain is located B posterior thighs to knees (along the hamstrings/sciatic nerve pathway. Pain started 2nd week of October 2025, pt walked on a soft sand, was not able to get very far which cased per symptoms. Pain is the same since onset.     Had a hx of CVA L UE 2006.    No latex allergies.     PAIN:  Are you having pain? Yes: NPRS scale: 2/10 Pain location: B posterior hip and thighs, not past knees Pain description: sore, like someone punched her in her thighs.  Aggravating factors: turning over in bed, sit <> stand, walking; standing; stairs; arching her back.  Relieving factors: bending over, sitting  PRECAUTIONS: no known precautions  RED FLAGS: Bowel or bladder incontinence: No and Cauda equina syndrome: No   WEIGHT BEARING RESTRICTIONS: No  FALLS:  Has patient fallen in last 6 months? No  LIVING ENVIRONMENT: Lives with: lives with their spouse Lives in: House/apartment Stairs: Yes: Internal: 16 steps; on left going up Has following equipment at home: None  OCCUPATION: Retired  PLOF: Independent  PATIENT GOALS: Get up and down stairs or anywhere more comfortably  NEXT MD VISIT: yes but pt does know.   OBJECTIVE:  Note: Objective measures were completed at Evaluation unless otherwise noted.  DIAGNOSTIC FINDINGS:    PATIENT SURVEYS:  Modified Oswestry:  MODIFIED OSWESTRY DISABILITY SCALE  Date: 12/16/2023 Score  Pain intensity 2 =  Pain medication provides me with complete relief from pain.  2. Personal care (washing, dressing, etc.) 1 =  I can take care of myself normally, but it increases my pain.  3. Lifting 3 = Pain prevents me from lifting heavy weights, but I can manage light to medium weights if they are conveniently positioned  4. Walking 2 =  Pain prevents me from walking more than  mile.  5. Sitting 1 =  I  can only sit in my favorite chair as long as I like.  6. Standing 3 =  Pain prevents me from  standing more than 1/2 hour.  7. Sleeping 1 = I can sleep well only by using pain medication.  8. Social Life 2 = Pain prevents me from participating in more energetic activities (eg. sports, dancing).  9. Traveling 1 =  I can travel anywhere, but it increases my pain.  10. Employment/ Homemaking 3 = Pain prevents me from doing anything but light duties.  Total 19/50 (38%)   Interpretation of scores: Score Category Description  0-20% Minimal Disability The patient can cope with most living activities. Usually no treatment is indicated apart from advice on lifting, sitting and exercise  21-40% Moderate Disability The patient experiences more pain and difficulty with sitting, lifting and standing. Travel and social life are more difficult and they may be disabled from work. Personal care, sexual activity and sleeping are not grossly affected, and the patient can usually be managed by conservative means  41-60% Severe Disability Pain remains the main problem in this group, but activities of daily living are affected. These patients require a detailed investigation  61-80% Crippled Back pain impinges on all aspects of the patient's life. Positive intervention is required  81-100% Bed-bound These patients are either bed-bound or exaggerating their symptoms  Bluford FORBES Zoe DELENA Karon DELENA, et al. Surgery versus conservative management of stable thoracolumbar fracture: the PRESTO feasibility RCT. [place] ([place]): [employer]; 2021 Nov. [facility] Technology Assessment, No. 25.62.) Appendix 3, Oswestry Disability Index category descriptors. Available from: Findjewelers.cz  Minimally Clinically Important Difference (MCID) = 12.8%  COGNITION: Overall cognitive status: Within functional limits for tasks assessed     SENSATION:   MUSCLE LENGTH:   POSTURE: forward neck, thoracic flexion, B protracted shoulders, R trunk side bend, decreased B hip  extension  PALPATION:   LUMBAR ROM:   AROM eval  Flexion WFL   Extension WFL with reproduction of symptoms  Right lateral flexion WFL   Left lateral flexion WFL  Right rotation [facility] with R posterior thigh pain (no pain when performed in sitting but with low back pain)  Left rotation WFL with R posterior thigh pain (no pain when performed in sitting)   (Blank rows = not tested)  LOWER EXTREMITY ROM:     Passive  Right eval Left eval  Hip flexion    Hip extension    Hip abduction    Hip adduction    Hip internal rotation (in sitting) 60 51  Hip external rotation (in sitting)  35 42  Knee flexion    Knee extension    Ankle dorsiflexion    Ankle plantarflexion    Ankle inversion    Ankle eversion     (Blank rows = not tested)  LOWER EXTREMITY MMT:    MMT Right eval Left eval  Hip flexion 4 4-  Hip extension (seated manually resisted) 3+ 3  Hip abduction (seated manually resisted) 4- 3+  Hip adduction    Hip internal rotation    Hip external rotation    Knee flexion 4 4-  Knee extension 5 4+  Ankle dorsiflexion    Ankle plantarflexion    Ankle inversion    Ankle eversion     (Blank rows = not tested)  LUMBAR SPECIAL TESTS:  (-) Piriformis test on L and R. Pt states that position feels pretty good.    FUNCTIONAL TESTS:    Decreased B femoral  control with sit <> stand    GAIT: Distance walked: 30 ft Assistive device utilized: None Level of assistance: Complete Independence Comments: antalgic, decreased stance R LE, B genu valgus, decreased B knee extension during stance phase, forward flexed.   TREATMENT DATE: 01/05/2024                                                                                                                                 Neuromuscular re education  Performed to improve posture and core strength to decrease pressure to LE nerves.   At start of session.  Blood pressure L arm sitting, mechanically taken, normal cuff: 159/68,  HR 54  Sitting transversus abdominis contraction 10x3 with 5 seconds   Sit <> stand with B UE assist, emphasis on femoral control 10x  Standing with slight forward flexion onto elevated mat table to decrease lumbar extension stress  Hip extension   R 10x3   L 10x3   Hip abduction    R 10x   L 10x    Posterior thigh symptoms   Standing with B UE assist posterior pelvic tilt 10x5 seconds for 2 sets  Reclined   Hooklying    Posterior pelvic tilt 10x5 seconds     No posterior thigh pain    Lower trunk rotation 10x3 each direction     Feels good for low back, posterior thighs feel better  Sitting on mat table  Hip ER   R 10x5 seconds   L 10x5 seconds    Improved exercise technique, movement at target joints, use of target muscles after mod verbal, visual, tactile cues.             PATIENT EDUCATION:  Education details: there-ex, HEP Person educated: Patient Education method: Explanation, Demonstration, Tactile cues, Verbal cues, and Handouts Education comprehension: verbalized understanding and returned demonstration  HOME EXERCISE PROGRAM: Access Code: [email or web address] URL: [email or web address] Date: 01/05/2024 Prepared by: Emil Glassman  Exercises - Standing Gluteal Sets  - 2 x daily - 7 x weekly - 3 sets - 10 reps - 5 seconds hold - Standing Hip Abduction with Counter Support  - 1 x daily - 4 x weekly - 3 sets - 10 reps - Supine Lower Trunk Rotation  - 1 x daily - 7 x weekly - 3 sets - 10 reps    ASSESSMENT:  CLINICAL IMPRESSION: Decreased B posterior thigh pain with treatment to promote lumbar mobility and promoting posterior pelvic tilt and trunk strengthening. Fair tolerance to today's session. Pt will benefit from continued skilled physical therapy services to decrease pain, improve strength and function.      OBJECTIVE IMPAIRMENTS: decreased strength, improper body mechanics, postural dysfunction, and pain.   ACTIVITY LIMITATIONS:  standing, stairs, transfers, bed mobility, and locomotion level  PARTICIPATION LIMITATIONS:   PERSONAL FACTORS: Age, Fitness, and 3+ comorbidities: CVA, depression, HTN, Morton's Neuroma are also affecting patient's functional outcome.   REHAB POTENTIAL: Fair  CLINICAL DECISION MAKING: Stable/uncomplicated  EVALUATION COMPLEXITY: Low   GOALS: Goals reviewed with patient? Yes  SHORT TERM GOALS: Target date: 12/26/2023  Pt will be independent with her initial HEP to decrease pain, improve strength, function, and ability to perform bed mobility, standing tasks, transfers, and stair negotiation more comfortably for her back.  Baseline: Pt has not yet started her HEP (12/16/2023) Goal status: INITIAL   LONG TERM GOALS: Target date: 02/13/2024  Pt will have a decrease in B posterior hip and thigh pain to 2/10 or less at worst to promote ability to perform bed mobility, transfers, standing tasks, and stair negotiation more comfortably.  Baseline: B posterior hip and thighs: 10/10 at worst (at time of onset) (12/16/2023) Goal status: INITIAL  2.  Pt will improve B hip extension and abduction strength by at least 1/2 MMT grade to promote  ability to perform bed mobility, transfers, standing tasks, and stair negotiation more comfortably.  Baseline:  MMT Right eval Left eval  Hip extension (seated manually resisted) 3+ 3  Hip abduction (seated manually resisted) 4- 3+   Goal status: INITIAL  3.  Pt will improve her Modified Oswestery Low Back Pain disability Questionnaire by at least 10 % as a demonstration of improved function.  Baseline: 38% (12/16/2023) Goal status: INITIAL   PLAN:  PT FREQUENCY: 1-2x/week  PT DURATION: 8 weeks  PLANNED INTERVENTIONS: 97110-Therapeutic exercises, 97530- Therapeutic activity, 97112- Neuromuscular re-education, 97535- Self Care, [id number]- Manual therapy, [id number]- Electrical stimulation (unattended), [phone]- Traction (mechanical), [id number]-  Ionotophoresis 4mg /ml Dexamethasone, 79439 (1-2 muscles), 20561 (3+ muscles)- Dry Needling, Patient/Family education, Balance training, Stair training, Joint mobilization, and Spinal mobilization.  PLAN FOR NEXT SESSION: Posture, trunk and glute strengthening, femoral control, manual techniques, modalities PRN.    Doral Ventrella, PT, DPT 01/05/2024, 1:52 PM

## 2024-01-07 ENCOUNTER — Ambulatory Visit

## 2024-01-07 DIAGNOSIS — M79652 Pain in left thigh: Secondary | ICD-10-CM

## 2024-01-07 DIAGNOSIS — M5417 Radiculopathy, lumbosacral region: Secondary | ICD-10-CM | POA: Diagnosis not present

## 2024-01-07 DIAGNOSIS — M5459 Other low back pain: Secondary | ICD-10-CM

## 2024-01-07 DIAGNOSIS — M79651 Pain in right thigh: Secondary | ICD-10-CM

## 2024-01-07 NOTE — Therapy (Signed)
 OUTPATIENT PHYSICAL THERAPY TREATMENT   Patient Name: Rachel Donovan MRN: [id number] DOB:09/28/1945, 78 y.o., female Today's Date: 01/07/2024  END OF SESSION:  PT End of Session - 01/07/24 1434     Visit Number 6    Number of Visits 17    Date for Recertification  02/13/24    PT Start Time 1434    PT Stop Time 1520    PT Time Calculation (min) 46 min    Activity Tolerance Patient tolerated treatment well    Behavior During Therapy WFL for tasks assessed/performed               Past Medical History:  Diagnosis Date   Anxiety    Aortic atherosclerosis    Arthritis    Benign neoplasm of colon, unspecified    Colon polyp    CVA (cerebral vascular accident) (HCC) 2006   Depression    High cholesterol    Hyperlipidemia    Hyperplastic colon polyp    Hypertension    Hypothyroid    surgical   IGT (impaired glucose tolerance)    Melanoma (HCC)    Migraines    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    OA (osteoarthritis)    PVD (peripheral vascular disease)    Rectocele    Shingles    Past Surgical History:  Procedure Laterality Date   ABDOMINAL HYSTERECTOMY     BREAST BIOPSY Right    X 2   BREAST BIOPSY Left    BREAST SURGERY     BX breast lumps X 3   CATARACT EXTRACTION, BILATERAL     COLONOSCOPY     PARTIAL HYSTERECTOMY     REPLACEMENT TOTAL KNEE Bilateral    RIGHT WRIST TENDON     THYROIDECTOMY     Patient Active Problem List   Diagnosis Date Noted   Syncope and collapse 02/12/2023   GAD (generalized anxiety disorder) 12/31/2022   Hyponatremia 11/01/2022   Memory impairment 10/04/2022   Former smoker 10/04/2022   Unintentional weight loss 10/04/2022   Hypothyroidism 03/12/2021   Bilateral hearing loss 10/19/2020   Peripheral vascular disease 09/20/2020   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   Cardiac arrhythmia 07/30/2019   Anxiety 06/09/2019   Essential hypertension 06/09/2019   Greater trochanteric pain syndrome 06/09/2019    Hardening of the aorta (main artery of the heart) 05/31/2019   Benign neoplasm of colon 05/31/2019   Major depression, single episode 05/31/2019   Other specified diseases of blood and blood-forming organs 05/24/2019   Disorder of bone 05/27/2018   Impacted cerumen 04/16/2017   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HCC) 04/16/2017   Encounter for general adult medical examination without abnormal findings 04/09/2017   Asthma without status asthmaticus 12/03/2016   Migraine 12/03/2016   Carpal tunnel syndrome 01/31/2016   Paresthesia of hand 01/31/2016   Atrophic vaginitis 05/18/2014   Rectocele 05/18/2014   History of stroke 12/29/2013   Sciatica 03/27/2012   Family history of diabetes mellitus 01/13/2012   Idiopathic peripheral neuropathy 01/13/2012   Cerebral infarction (HCC) 11/19/2011   Mixed hyperlipidemia 11/19/2011   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   Unilateral primary osteoarthritis, unspecified knee 11/19/2011   Stroke (HCC) 09/04/2005    PCP: Wendee Lynwood HERO, NP   REFERRING PROVIDER: Watt Mirza, MD  REFERRING DIAG: Diagnosis M79.604 (ICD-10-CM) - Right leg pain M54.9,G89.29 (ICD-10-CM) - Chronic back pain greater than 3 months duration M54.10 (ICD-10-CM) - Bilateral radiating leg pain  Rationale for Evaluation and Treatment:  Rehabilitation  THERAPY DIAG:  Radiculopathy, lumbosacral region  Other low back pain  Pain in right thigh  Pain in left thigh  ONSET DATE: October 2025  SUBJECTIVE:                                                                                                                                                                                           SUBJECTIVE STATEMENT:  Back is ok, the R and L posterior thighs bothers her in the morning or standing up and walking after sitting for a while. 4/10 B posterior thighs when standing up and walking from waiting room to treatment room. Sitting up straight also bothers her  posterior thighs.      Had to cancel her cruise because her husband had a seizure.     PERTINENT HISTORY:  Low back pain with B LE radiating symptoms. Pain is located B posterior thighs to knees (along the hamstrings/sciatic nerve pathway. Pain started 2nd week of October 2025, pt walked on a soft sand, was not able to get very far which cased per symptoms. Pain is the same since onset.     Had a hx of CVA L UE 2006.    No latex allergies.     PAIN:  Are you having pain? Yes: NPRS scale: 2/10 Pain location: B posterior hip and thighs, not past knees Pain description: sore, like someone punched her in her thighs.  Aggravating factors: turning over in bed, sit <> stand, walking; standing; stairs; arching her back.  Relieving factors: bending over, sitting  PRECAUTIONS: no known precautions  RED FLAGS: Bowel or bladder incontinence: No and Cauda equina syndrome: No   WEIGHT BEARING RESTRICTIONS: No  FALLS:  Has patient fallen in last 6 months? No  LIVING ENVIRONMENT: Lives with: lives with their spouse Lives in: House/apartment Stairs: Yes: Internal: 16 steps; on left going up Has following equipment at home: None  OCCUPATION: Retired  PLOF: Independent  PATIENT GOALS: Get up and down stairs or anywhere more comfortably  NEXT MD VISIT: yes but pt does know.   OBJECTIVE:  Note: Objective measures were completed at Evaluation unless otherwise noted.  DIAGNOSTIC FINDINGS:    PATIENT SURVEYS:  Modified Oswestry:  MODIFIED OSWESTRY DISABILITY SCALE  Date: 12/16/2023 Score  Pain intensity 2 =  Pain medication provides me with complete relief from pain.  2. Personal care (washing, dressing, etc.) 1 =  I can take care of myself normally, but it increases my pain.  3. Lifting 3 = Pain prevents me from lifting heavy weights, but I can manage light to medium weights if they are conveniently positioned  4. Walking 2 =  Pain prevents me from walking more than  mile.   5. Sitting 1 =  I can only sit in my favorite chair as long as I like.  6. Standing 3 =  Pain prevents me from standing more than 1/2 hour.  7. Sleeping 1 = I can sleep well only by using pain medication.  8. Social Life 2 = Pain prevents me from participating in more energetic activities (eg. sports, dancing).  9. Traveling 1 =  I can travel anywhere, but it increases my pain.  10. Employment/ Homemaking 3 = Pain prevents me from doing anything but light duties.  Total 19/50 (38%)   Interpretation of scores: Score Category Description  0-20% Minimal Disability The patient can cope with most living activities. Usually no treatment is indicated apart from advice on lifting, sitting and exercise  21-40% Moderate Disability The patient experiences more pain and difficulty with sitting, lifting and standing. Travel and social life are more difficult and they may be disabled from work. Personal care, sexual activity and sleeping are not grossly affected, and the patient can usually be managed by conservative means  41-60% Severe Disability Pain remains the main problem in this group, but activities of daily living are affected. These patients require a detailed investigation  61-80% Crippled Back pain impinges on all aspects of the patient's life. Positive intervention is required  81-100% Bed-bound These patients are either bed-bound or exaggerating their symptoms  Bluford FORBES Zoe DELENA Karon DELENA, et al. Surgery versus conservative management of stable thoracolumbar fracture: the PRESTO feasibility RCT. [place] ([place]): [employer]; 2021 Nov. [facility] Technology Assessment, No. 25.62.) Appendix 3, Oswestry Disability Index category descriptors. Available from: Findjewelers.cz  Minimally Clinically Important Difference (MCID) = 12.8%  COGNITION: Overall cognitive status: Within functional limits for tasks assessed     SENSATION:   MUSCLE  LENGTH:   POSTURE: forward neck, thoracic flexion, B protracted shoulders, R trunk side bend, decreased B hip extension  PALPATION:   LUMBAR ROM:   AROM eval  Flexion WFL   Extension WFL with reproduction of symptoms  Right lateral flexion WFL   Left lateral flexion WFL  Right rotation [facility] with R posterior thigh pain (no pain when performed in sitting but with low back pain)  Left rotation WFL with R posterior thigh pain (no pain when performed in sitting)   (Blank rows = not tested)  LOWER EXTREMITY ROM:     Passive  Right eval Left eval  Hip flexion    Hip extension    Hip abduction    Hip adduction    Hip internal rotation (in sitting) 60 51  Hip external rotation (in sitting)  35 42  Knee flexion    Knee extension    Ankle dorsiflexion    Ankle plantarflexion    Ankle inversion    Ankle eversion     (Blank rows = not tested)  LOWER EXTREMITY MMT:    MMT Right eval Left eval  Hip flexion 4 4-  Hip extension (seated manually resisted) 3+ 3  Hip abduction (seated manually resisted) 4- 3+  Hip adduction    Hip internal rotation    Hip external rotation    Knee flexion 4 4-  Knee extension 5 4+  Ankle dorsiflexion    Ankle plantarflexion    Ankle inversion    Ankle eversion     (Blank rows = not tested)  LUMBAR SPECIAL TESTS:  (-) Piriformis test on  L and R. Pt states that position feels pretty good.    FUNCTIONAL TESTS:    Decreased B femoral control with sit <> stand    GAIT: Distance walked: 30 ft Assistive device utilized: None Level of assistance: Complete Independence Comments: antalgic, decreased stance R LE, B genu valgus, decreased B knee extension during stance phase, forward flexed.   TREATMENT DATE: 01/05/2024                                                                                                                                 Neuromuscular re education  Performed to improve posture and core strength to decrease pressure  to LE nerves.   At start of session.  Blood pressure L arm sitting, mechanically taken, normal cuff: 135/76, HR 62   Seated trunk rotation 10x3 each direction to promote mobility  Seated B scapular retraction 10x3 with 5 second holds   Sitting with upright proper posture  Manually resisted side bend isometrics   L 10x5 seconds    R 10x5 seconds for 3 sets     No symptoms, decreased R lumbar concavity, decreased B posterior thigh symptoms when sitting up straight   Manually resisted R upper trunk rotation isometrics to decrease R lumbar rotation posture (to promote neutral posture) 10x2 with 5 seconds holds    No B posterior thigh symptoms with upright posture afterwards.     B shoulder extension isometrics, hands on knees. Increased symptoms    R shoulder extension, hand on knee 10x5 seconds for 3 sets    Improved exercise technique, movement at target joints, use of target muscles after mod verbal, visual, tactile cues.             PATIENT EDUCATION:  Education details: there-ex, HEP Person educated: Patient Education method: Explanation, Demonstration, Tactile cues, Verbal cues, and Handouts Education comprehension: verbalized understanding and returned demonstration  HOME EXERCISE PROGRAM: Access Code: [email or web address] URL: [email or web address] Date: 01/05/2024 Prepared by: Emil Glassman  Exercises - Standing Gluteal Sets  - 2 x daily - 7 x weekly - 3 sets - 10 reps - 5 seconds hold - Standing Hip Abduction with Counter Support  - 1 x daily - 4 x weekly - 3 sets - 10 reps - Supine Lower Trunk Rotation  - 1 x daily - 7 x weekly - 3 sets - 10 reps    ASSESSMENT:  CLINICAL IMPRESSION: Worked on thoracic extension, thoracolumbar mobility, as well as trunk muscle activation to decrease R lumbar concavity and R lumbar rotation posture to decrease pressure to LE nerves. No pain reported at rest in sitting, standing and walking after session Pt will  benefit from continued skilled physical therapy services to decrease pain, improve strength and function.      OBJECTIVE IMPAIRMENTS: decreased strength, improper body mechanics, postural dysfunction, and pain.   ACTIVITY LIMITATIONS: standing, stairs, transfers, bed mobility, and locomotion level  PARTICIPATION LIMITATIONS:   PERSONAL  FACTORS: Age, Fitness, and 3+ comorbidities: CVA, depression, HTN, Morton's Neuroma are also affecting patient's functional outcome.   REHAB POTENTIAL: Fair    CLINICAL DECISION MAKING: Stable/uncomplicated  EVALUATION COMPLEXITY: Low   GOALS: Goals reviewed with patient? Yes  SHORT TERM GOALS: Target date: 12/26/2023  Pt will be independent with her initial HEP to decrease pain, improve strength, function, and ability to perform bed mobility, standing tasks, transfers, and stair negotiation more comfortably for her back.  Baseline: Pt has not yet started her HEP (12/16/2023) Goal status: INITIAL   LONG TERM GOALS: Target date: 02/13/2024  Pt will have a decrease in B posterior hip and thigh pain to 2/10 or less at worst to promote ability to perform bed mobility, transfers, standing tasks, and stair negotiation more comfortably.  Baseline: B posterior hip and thighs: 10/10 at worst (at time of onset) (12/16/2023) Goal status: INITIAL  2.  Pt will improve B hip extension and abduction strength by at least 1/2 MMT grade to promote  ability to perform bed mobility, transfers, standing tasks, and stair negotiation more comfortably.  Baseline:  MMT Right eval Left eval  Hip extension (seated manually resisted) 3+ 3  Hip abduction (seated manually resisted) 4- 3+   Goal status: INITIAL  3.  Pt will improve her Modified Oswestery Low Back Pain disability Questionnaire by at least 10 % as a demonstration of improved function.  Baseline: 38% (12/16/2023) Goal status: INITIAL   PLAN:  PT FREQUENCY: 1-2x/week  PT DURATION: 8 weeks  PLANNED  INTERVENTIONS: 97110-Therapeutic exercises, 97530- Therapeutic activity, 97112- Neuromuscular re-education, 97535- Self Care, 02859- Manual therapy, G0283- Electrical stimulation (unattended), [phone]- Traction (mechanical), [id number]- Ionotophoresis 4mg /ml Dexamethasone, 79439 (1-2 muscles), 20561 (3+ muscles)- Dry Needling, Patient/Family education, Balance training, Stair training, Joint mobilization, and Spinal mobilization.  PLAN FOR NEXT SESSION: Posture, trunk and glute strengthening, femoral control, manual techniques, modalities PRN.    Rhapsody Wolven, PT, DPT 01/07/2024, 3:24 PM

## 2024-01-09 ENCOUNTER — Other Ambulatory Visit: Payer: Self-pay | Admitting: Nurse Practitioner

## 2024-01-09 DIAGNOSIS — I10 Essential (primary) hypertension: Secondary | ICD-10-CM

## 2024-01-09 DIAGNOSIS — Z8673 Personal history of transient ischemic attack (TIA), and cerebral infarction without residual deficits: Secondary | ICD-10-CM

## 2024-01-09 NOTE — Telephone Encounter (Signed)
 We have  the option of a once a month pill called Boniva or we can do the injection that is once every 6 months if she is willing  The mammogram showed something in the right breast and they should be reaching out to get her scheduled for further imaging (like an ultrasound0

## 2024-01-09 NOTE — Telephone Encounter (Signed)
-----   Message from [facility] T sent at 01/05/2024 11:07 AM EST ----- Called pt and relayed information. Pt verbalized understanding.  Pt states that she has taken fosamax  in the past and did not react well. States that she had lots of shoulder pain on that medication.   Pt is requesting for review/results of her recent mammogram. States she has not heard back from anyone.  ----- Message ----- From: Wendee Lynwood HERO, NP Sent: 01/02/2024   8:13 AM EST To: Wendee Gunnels  Notified via My Chart   See below  ----- Message ----- From: Interface, Rad Results In Sent: 12/29/2023   3:46 PM EST To: Lynwood HERO Wendee, NP

## 2024-01-12 ENCOUNTER — Ambulatory Visit

## 2024-01-12 ENCOUNTER — Telehealth: Payer: Self-pay

## 2024-01-12 MED ORDER — IBANDRONATE SODIUM 150 MG PO TABS: 150.0000 mg | ORAL_TABLET | ORAL | 3 refills | Status: AC

## 2024-01-12 NOTE — Telephone Encounter (Signed)
 No show. Called patient and left a message pertaining to appointment and a reminder for the next follow up session. Return phone call requested. Phone number [phone]) provided.

## 2024-01-12 NOTE — Addendum Note (Signed)
 Addended by: WENDEE LYNWOOD HERO on: 01/12/2024 01:46 PM   Modules accepted: Orders

## 2024-01-12 NOTE — Telephone Encounter (Signed)
-----   Message from [facility] T sent at 01/09/2024 11:52 AM EST -----  ----- Message ----- From: Wendee Lynwood HERO, NP Sent: 01/09/2024   8:01 AM EST To: Wendee Gunnels  ----- Message from Lynwood HERO Wendee, NP sent at 01/09/2024  8:01 AM EST -----   ----- Message ----- From: Sebastian Shu, CMA Sent: 01/05/2024  11:07 AM EST To: Lynwood HERO Wendee, NP  Called pt and relayed information. Pt verbalized understanding.  Pt states that she has taken fosamax  in the past and did not react well. States that she had lots of shoulder pain on that medication.   Pt is requesting for review/results of her recent mammogram. States she has not heard back from anyone.  ----- Message ----- From: Wendee Lynwood HERO, NP Sent: 01/02/2024   8:13 AM EST To: Wendee Gunnels  Notified via My Chart   See below  ----- Message ----- From: Interface, Rad Results In Sent: 12/29/2023   3:46 PM EST To: Lynwood HERO Wendee, NP

## 2024-01-13 ENCOUNTER — Ambulatory Visit
Admission: RE | Admit: 2024-01-13 | Discharge: 2024-01-13 | Disposition: A | Source: Ambulatory Visit | Attending: Nurse Practitioner

## 2024-01-13 ENCOUNTER — Ambulatory Visit: Admitting: Nurse Practitioner

## 2024-01-13 ENCOUNTER — Encounter: Payer: Self-pay | Admitting: Nurse Practitioner

## 2024-01-13 ENCOUNTER — Other Ambulatory Visit

## 2024-01-13 ENCOUNTER — Inpatient Hospital Stay: Admission: RE | Admit: 2024-01-13

## 2024-01-13 VITALS — BP 136/84 | HR 62 | Temp 98.6°F | Ht 61.0 in | Wt 119.0 lb

## 2024-01-13 DIAGNOSIS — E871 Hypo-osmolality and hyponatremia: Secondary | ICD-10-CM

## 2024-01-13 DIAGNOSIS — F411 Generalized anxiety disorder: Secondary | ICD-10-CM

## 2024-01-13 DIAGNOSIS — R928 Other abnormal and inconclusive findings on diagnostic imaging of breast: Secondary | ICD-10-CM

## 2024-01-13 DIAGNOSIS — I10 Essential (primary) hypertension: Secondary | ICD-10-CM

## 2024-01-13 LAB — BASIC METABOLIC PANEL WITH GFR
BUN: 17 mg/dL (ref 6–23)
CO2: 30 meq/L (ref 19–32)
Calcium: 9.6 mg/dL (ref 8.4–10.5)
Chloride: 93 meq/L — ABNORMAL LOW (ref 96–112)
Creatinine, Ser: 0.78 mg/dL (ref 0.40–1.20)
GFR: 72.53 mL/min (ref >60.00)
Glucose, Bld: 121 mg/dL — ABNORMAL HIGH (ref 70–99)
Potassium: 4.3 meq/L (ref 3.5–5.1)
Sodium: 131 meq/L — ABNORMAL LOW (ref 135–145)

## 2024-01-13 MED ORDER — LORAZEPAM 1 MG PO TABS: ORAL_TABLET | ORAL | 0 refills | Status: AC

## 2024-01-13 NOTE — Progress Notes (Signed)
 Established Patient Office Visit  Subjective   Patient ID: Rachel Donovan, female    DOB: Jun 11, 1945  Age: 78 y.o. MRN: [id number]  Chief Complaint  Patient presents with   Medical Management of Chronic Issues    HPI  Discussed the use of AI scribe software for clinical note transcription with the patient, who gave verbal consent to proceed.  History of Present Illness Rachel Donovan is a 78 year old female with anxiety and hypertension who presents for follow-up on medication adjustments.  She has been on sertraline  for about a month, having started it after returning from [place] the beach in [place]. She is now taking a full pill every evening and is experiencing good sleep. Her anxiety has improved, with more days of 'normal' and less anxiety. She also uses lorazepam  infrequently for anxiety management. Previously, she was on Wellbutrin , which did not adequately address her anxiety. She had been on Lexapro in the past, which caused her sodium levels to drop, a concern that is being monitored with the current sertraline  use.  Her hypertension is being managed with amlodipine , currently at a dose of 10 mg. No swelling in her legs.  She had a skin lesion removed by a dermatologist in November, which has scabbed over but is not infected. She has a follow-up appointment with the dermatologist on the 29th.  No fever, chills, chest pain, shortness of breath, headaches, fatigue, thoughts of self-harm or harm to others, and hallucinations.    Review of Systems  Constitutional:  Negative for chills and fever.  Respiratory:  Negative for shortness of breath.   Cardiovascular:  Negative for chest pain.  Neurological:  Negative for dizziness and headaches.  Psychiatric/Behavioral:  Negative for hallucinations and suicidal ideas. The patient does not have insomnia.       Objective:     BP 136/84   Pulse 62   Temp 98.6 F (37 C) (Oral)   Ht 5' 1 (1.549 m)   Wt 119 lb  (54 kg)   SpO2 98%   BMI 22.48 kg/m  BP Readings from Last 3 Encounters:  01/13/24 136/84  12/22/23 (!) 178/86  10/23/23 136/86   Wt Readings from Last 3 Encounters:  01/13/24 119 lb (54 kg)  12/22/23 119 lb (54 kg)  10/23/23 116 lb 4 oz (52.7 kg)   SpO2 Readings from Last 3 Encounters:  01/13/24 98%  12/22/23 97%  10/23/23 99%      Physical Exam Vitals and nursing note reviewed.  Constitutional:      Appearance: Normal appearance.  Cardiovascular:     Rate and Rhythm: Normal rate and regular rhythm.     Heart sounds: Normal heart sounds.  Pulmonary:     Effort: Pulmonary effort is normal.     Breath sounds: Normal breath sounds.  Neurological:     Mental Status: She is alert.      No results found for any visits on 01/13/24.    The ASCVD Risk score (Arnett DK, et al., 2019) failed to calculate for the following reasons:   Risk score cannot be calculated because patient has a medical history suggesting prior/existing ASCVD    Assessment & Plan:   Problem List Items Addressed This Visit       Cardiovascular and Mediastinum   Essential hypertension   Relevant Orders   Basic metabolic panel with GFR     Other   Hyponatremia - Primary   Relevant Orders   Basic  metabolic panel with GFR   GAD (generalized anxiety disorder)   Relevant Medications   buPROPion  (WELLBUTRIN  XL) 150 MG 24 hr tablet   LORazepam  (ATIVAN ) 1 MG tablet   Assessment and Plan Assessment & Plan Generalized anxiety disorder Managed with sertraline , well-tolerated for one month with improved sleep and reduced anxiety. Wellbutrin  ineffective for anxiety. Sodium levels monitored for hyponatremia risk with sertraline . - Continue sertraline  as prescribed. - Recheck sodium levels for hyponatremia. - Refilled lorazepam  for infrequent use.  Essential hypertension Managed with amlodipine , losartan , and metoprolol . Blood pressure improved but slightly elevated. No leg swelling reported.  Discussed amlodipine  side effects and monitoring. - Continue current antihypertensive regimen. - Educated on signs of leg swelling as a side effect of amlodipine .  Skin lesion, post-procedure Post-procedure healing ongoing after dermabrasion in October. Follow-up with dermatologist scheduled. - Attend follow-up appointment with dermatologist on December 29th.  General health maintenance Discussed importance of regular follow-up and monitoring of chronic conditions. - Scheduled follow-up appointment in three months to reassess blood pressure and overall health.  Return in about 3 months (around 04/12/2024) for BP recheck/GAD.    Adina Crandall, NP

## 2024-01-13 NOTE — Patient Instructions (Signed)
 Nice to see you today I will be in touch with the labs once I have them Follow up with me in 3 months, sooner if you need me

## 2024-01-14 ENCOUNTER — Ambulatory Visit: Payer: Self-pay | Admitting: Nurse Practitioner

## 2024-01-14 ENCOUNTER — Ambulatory Visit

## 2024-01-14 DIAGNOSIS — E871 Hypo-osmolality and hyponatremia: Secondary | ICD-10-CM

## 2024-01-16 ENCOUNTER — Telehealth: Payer: Self-pay | Admitting: Nurse Practitioner

## 2024-01-16 NOTE — Telephone Encounter (Signed)
 Copied from CRM #[id number]. Topic: General - Other >> Jan 15, 2024  4:57 PM Roselie BROCKS wrote: Reason for CRM: Patients daughter in law Leone) requests a return call concerning the patient about a conversation the patient had with the clinic,

## 2024-01-19 ENCOUNTER — Ambulatory Visit: Admitting: Podiatry

## 2024-01-19 ENCOUNTER — Ambulatory Visit

## 2024-01-19 ENCOUNTER — Telehealth: Payer: Self-pay

## 2024-01-19 NOTE — Telephone Encounter (Signed)
 Copied from CRM #[id number]. Topic: Clinical - Medication Question >> Jan 19, 2024  1:26 PM [facility] wrote: Reason for CRM: Reason for CRM: Randine (patients daughter in law) called and stated patient is apprehensive about taking ibandronate  (BONIVA ) 150 MG tablet. Wants to see if there was an alternative due to reading on medication having other risks and being in system for a long period of time. Prefers Janae to call her.   [phone]

## 2024-01-19 NOTE — Telephone Encounter (Signed)
 Called and spoke with tracy on dpr.   Randine states that the patient does not want to do anymore injectables.  Also states that she has to ask the patient if she is willing to take fosamax .

## 2024-01-19 NOTE — Telephone Encounter (Signed)
 If I remember correctly she has tried fosamax  in the past. Another option is the injectable that is twice a year (Jubbonti)

## 2024-01-20 DIAGNOSIS — M79651 Pain in right thigh: Secondary | ICD-10-CM

## 2024-01-20 DIAGNOSIS — M79652 Pain in left thigh: Secondary | ICD-10-CM

## 2024-01-20 DIAGNOSIS — M5459 Other low back pain: Secondary | ICD-10-CM

## 2024-01-20 DIAGNOSIS — M5417 Radiculopathy, lumbosacral region: Secondary | ICD-10-CM

## 2024-01-20 NOTE — Telephone Encounter (Signed)
 noted

## 2024-01-20 NOTE — Therapy (Signed)
 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    OA (osteoarthritis)    PVD (peripheral vascular disease)    Rectocele    Shingles    Past Surgical History:  Procedure Laterality Date   ABDOMINAL HYSTERECTOMY     BREAST BIOPSY Right    X 2   BREAST BIOPSY Left    BREAST SURGERY     BX breast lumps X 3   CATARACT EXTRACTION, BILATERAL     COLONOSCOPY     PARTIAL HYSTERECTOMY     REPLACEMENT TOTAL KNEE Bilateral    RIGHT WRIST TENDON     THYROIDECTOMY     Patient Active Problem List   Diagnosis Date Noted   Syncope and collapse 02/12/2023   GAD (generalized anxiety disorder) 12/31/2022   Hyponatremia 11/01/2022   Memory impairment 10/04/2022   Former smoker 10/04/2022   Unintentional weight loss 10/04/2022   Hypothyroidism 03/12/2021   Bilateral hearing loss 10/19/2020   Peripheral vascular disease 09/20/2020   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   Cardiac arrhythmia 07/30/2019   Anxiety 06/09/2019   Essential hypertension 06/09/2019   Greater trochanteric pain syndrome 06/09/2019   Hardening of the aorta (main artery of the heart) 05/31/2019   Benign neoplasm of colon 05/31/2019   Major depression, single episode 05/31/2019   Other specified diseases of blood and blood-forming organs 05/24/2019   Disorder of bone 05/27/2018   Impacted cerumen 04/16/2017   Malignant melanoma of upper  limb (HCC) 04/16/2017   Encounter for general adult medical examination without abnormal findings 04/09/2017   Asthma without status asthmaticus 12/03/2016   Migraine 12/03/2016   Carpal tunnel syndrome 01/31/2016   Paresthesia of hand 01/31/2016   Atrophic vaginitis 05/18/2014   Rectocele 05/18/2014   History of stroke 12/29/2013   Sciatica 03/27/2012   Family history of diabetes mellitus 01/13/2012   Idiopathic peripheral neuropathy 01/13/2012   Cerebral infarction (HCC) 11/19/2011   Mixed hyperlipidemia 11/19/2011   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   Unilateral primary osteoarthritis, unspecified knee 11/19/2011   Stroke (HCC) 09/04/2005    PCP: Wendee Lynwood HERO, NP   REFERRING PROVIDER: Watt Mirza, MD  REFERRING DIAG: Diagnosis M79.604 (ICD-10-CM) - Right leg pain M54.9,G89.29 (ICD-10-CM) - Chronic back pain greater than 3 months duration M54.10 (ICD-10-CM) - Bilateral radiating leg pain  Rationale for Evaluation and Treatment: Rehabilitation  THERAPY DIAG:  Radiculopathy, lumbosacral region  Other low back pain  Pain in right thigh  Pain in left thigh  ONSET DATE: October 2025  SUBJECTIVE:  SUBJECTIVE STATEMENT:     PERTINENT HISTORY:  Low back pain with B LE radiating symptoms. Pain is located B posterior thighs to knees (along the hamstrings/sciatic nerve pathway. Pain started 2nd week of October 2025, pt walked on a soft sand, was not able to get very far which cased per symptoms. Pain is the same since onset.     Had a hx of CVA L UE 2006.    No latex allergies.     PAIN:  Are you having pain? Yes: NPRS scale: 2/10 Pain location: B posterior hip and thighs, not past knees Pain description: sore, like someone punched her in her thighs.  Aggravating  factors: turning over in bed, sit <> stand, walking; standing; stairs; arching her back.  Relieving factors: bending over, sitting  PRECAUTIONS: no known precautions  RED FLAGS: Bowel or bladder incontinence: No and Cauda equina syndrome: No   WEIGHT BEARING RESTRICTIONS: No  FALLS:  Has patient fallen in last 6 months? No  LIVING ENVIRONMENT: Lives with: lives with their spouse Lives in: House/apartment Stairs: Yes: Internal: 16 steps; on left going up Has following equipment at home: None  OCCUPATION: Retired  PLOF: Independent  PATIENT GOALS: Get up and down stairs or anywhere more comfortably  NEXT MD VISIT: yes but pt does know.   OBJECTIVE:  Note: Objective measures were completed at Evaluation unless otherwise noted.  DIAGNOSTIC FINDINGS:    PATIENT SURVEYS:  Modified Oswestry:  MODIFIED OSWESTRY DISABILITY SCALE  Date: 12/16/2023 Score  Pain intensity 2 =  Pain medication provides me with complete relief from pain.  2. Personal care (washing, dressing, etc.) 1 =  I can take care of myself normally, but it increases my pain.  3. Lifting 3 = Pain prevents me from lifting heavy weights, but I can manage light to medium weights if they are conveniently positioned  4. Walking 2 =  Pain prevents me from walking more than  mile.  5. Sitting 1 =  I can only sit in my favorite chair as long as I like.  6. Standing 3 =  Pain prevents me from standing more than 1/2 hour.  7. Sleeping 1 = I can sleep well only by using pain medication.  8. Social Life 2 = Pain prevents me from participating in more energetic activities (eg. sports, dancing).  9. Traveling 1 =  I can travel anywhere, but it increases my pain.  10. Employment/ Homemaking 3 = Pain prevents me from doing anything but light duties.  Total 19/50 (38%)   Interpretation of scores: Score Category Description  0-20% Minimal Disability The patient can cope with most living activities. Usually no treatment is  indicated apart from advice on lifting, sitting and exercise  21-40% Moderate Disability The patient experiences more pain and difficulty with sitting, lifting and standing. Travel and social life are more difficult and they may be disabled from work. Personal care, sexual activity and sleeping are not grossly affected, and the patient can usually be managed by conservative means  41-60% Severe Disability Pain remains the main problem in this group, but activities of daily living are affected. These patients require a detailed investigation  61-80% Crippled Back pain impinges on all aspects of the patients life. Positive intervention is required  81-100% Bed-bound These patients are either bed-bound or exaggerating their symptoms  Bluford FORBES Zoe DELENA Karon DELENA, et al. Surgery versus conservative management of stable thoracolumbar fracture: the PRESTO feasibility RCT. [place] ([place]): [employer]; [phone]  Nov. (Health Technology Assessment, No. 25.62.) Appendix 3, Oswestry Disability Index category descriptors. Available from: Findjewelers.cz  Minimally Clinically Important Difference (MCID) = 12.8%  COGNITION: Overall cognitive status: Within functional limits for tasks assessed     SENSATION:   MUSCLE LENGTH:   POSTURE: forward neck, thoracic flexion, B protracted shoulders, R trunk side bend, decreased B hip extension  PALPATION:   LUMBAR ROM:   AROM eval  Flexion WFL   Extension WFL with reproduction of symptoms  Right lateral flexion WFL   Left lateral flexion WFL  Right rotation [facility] with R posterior thigh pain (no pain when performed in sitting but with low back pain)  Left rotation WFL with R posterior thigh pain (no pain when performed in sitting)   (Blank rows = not tested)  LOWER EXTREMITY ROM:     Passive  Right eval Left eval  Hip flexion    Hip extension    Hip abduction    Hip adduction    Hip internal rotation (in  sitting) 60 51  Hip external rotation (in sitting)  35 42  Knee flexion    Knee extension    Ankle dorsiflexion    Ankle plantarflexion    Ankle inversion    Ankle eversion     (Blank rows = not tested)  LOWER EXTREMITY MMT:    MMT Right eval Left eval  Hip flexion 4 4-  Hip extension (seated manually resisted) 3+ 3  Hip abduction (seated manually resisted) 4- 3+  Hip adduction    Hip internal rotation    Hip external rotation    Knee flexion 4 4-  Knee extension 5 4+  Ankle dorsiflexion    Ankle plantarflexion    Ankle inversion    Ankle eversion     (Blank rows = not tested)  LUMBAR SPECIAL TESTS:  (-) Piriformis test on L and R. Pt states that position feels pretty good.    FUNCTIONAL TESTS:    Decreased B femoral control with sit <> stand    GAIT: Distance walked: 30 ft Assistive device utilized: None Level of assistance: Complete Independence Comments: antalgic, decreased stance R LE, B genu valgus, decreased B knee extension during stance phase, forward flexed.       PATIENT EDUCATION:  Education details: there-ex, HEP Person educated: Patient Education method: Explanation, Demonstration, Tactile cues, Verbal cues, and Handouts Education comprehension: verbalized understanding and returned demonstration  HOME EXERCISE PROGRAM: Access Code: [email or web address] URL: [email or web address] Date: 01/05/2024 Prepared by: Emil Glassman  Exercises - Standing Gluteal Sets  - 2 x daily - 7 x weekly - 3 sets - 10 reps - 5 seconds hold - Standing Hip Abduction with Counter Support  - 1 x daily - 4 x weekly - 3 sets - 10 reps - Supine Lower Trunk Rotation  - 1 x daily - 7 x weekly - 3 sets - 10 reps    ASSESSMENT:  CLINICAL IMPRESSION: Pain seems to improve with treatment to improve thoracic mobility, trunk strength, and posture to decrease pressure to LE nerves. Skilled physical therapy services discharged secondary to pt request.      OBJECTIVE  IMPAIRMENTS: decreased strength, improper body mechanics, postural dysfunction, and pain.   ACTIVITY LIMITATIONS: standing, stairs, transfers, bed mobility, and locomotion level  PARTICIPATION LIMITATIONS:   PERSONAL FACTORS: Age, Fitness, and 3+ comorbidities: CVA, depression, HTN, Morton's Neuroma are also affecting patient's functional outcome.   REHAB POTENTIAL: Fair    CLINICAL DECISION MAKING: Stable/uncomplicated  EVALUATION COMPLEXITY:  Low   GOALS: Goals reviewed with patient? Yes  SHORT TERM GOALS: Target date: 12/26/2023  Pt will be independent with her initial HEP to decrease pain, improve strength, function, and ability to perform bed mobility, standing tasks, transfers, and stair negotiation more comfortably for her back.  Baseline: Pt has not yet started her HEP (12/16/2023) Goal status: Unable to assess    LONG TERM GOALS: Target date: 02/13/2024  Pt will have a decrease in B posterior hip and thigh pain to 2/10 or less at worst to promote ability to perform bed mobility, transfers, standing tasks, and stair negotiation more comfortably.  Baseline: B posterior hip and thighs: 10/10 at worst (at time of onset) (12/16/2023) Goal status: Unable to assess  2.  Pt will improve B hip extension and abduction strength by at least 1/2 MMT grade to promote  ability to perform bed mobility, transfers, standing tasks, and stair negotiation more comfortably.  Baseline:  MMT Right eval Left eval  Hip extension (seated manually resisted) 3+ 3  Hip abduction (seated manually resisted) 4- 3+   Goal status: Unable to assess  3.  Pt will improve her Modified [place] Low Back Pain disability Questionnaire by at least 10 % as a demonstration of improved function.  Baseline: 38% (12/16/2023) Goal status: Unable to assess   PLAN:  PT FREQUENCY: 1-2x/week  PT DURATION: 8 weeks  PLANNED INTERVENTIONS: 97110-Therapeutic exercises, 97530- Therapeutic activity, 97112-  Neuromuscular re-education, 97535- Self Care, 02859- Manual therapy, [id number]- Electrical stimulation (unattended), [phone]- Traction (mechanical), [id number]- Ionotophoresis 4mg /ml Dexamethasone, 79439 (1-2 muscles), 20561 (3+ muscles)- Dry Needling, Patient/Family education, Balance training, Stair training, Joint mobilization, and Spinal mobilization.  PLAN FOR NEXT SESSION: Posture, trunk and glute strengthening, femoral control, manual techniques, modalities PRN.    Rachel Donovan, PT, DPT 01/20/2024, 4:00 PM

## 2024-01-21 ENCOUNTER — Ambulatory Visit

## 2024-01-23 ENCOUNTER — Other Ambulatory Visit: Payer: Self-pay

## 2024-01-26 ENCOUNTER — Institutional Professional Consult (permissible substitution): Admitting: Neurology

## 2024-01-26 ENCOUNTER — Ambulatory Visit

## 2024-01-26 ENCOUNTER — Encounter: Payer: Self-pay | Admitting: Neurology

## 2024-01-26 VITALS — BP 142/82 | HR 64 | Ht 61.0 in | Wt 121.0 lb

## 2024-01-26 DIAGNOSIS — G3184 Mild cognitive impairment, so stated: Secondary | ICD-10-CM | POA: Diagnosis not present

## 2024-01-26 NOTE — Progress Notes (Signed)
 "   [facility]  PATIENT: Rachel Donovan DOB: Apr 15, 1945  REQUESTING CLINICIAN: Wendee Lynwood HERO, NP HISTORY FROM: Patient and daughter in law  REASON FOR VISIT: Memory loss    HISTORICAL  CHIEF COMPLAINT:  Chief Complaint  Patient presents with   New Patient (Initial Visit)    Room 12 Daughter inlaw Internal referral for Memory impairment ([facility] completed-17    HISTORY OF PRESENT ILLNESS:  Discussed the use of AI scribe software for clinical note transcription with the patient, who gave verbal consent to proceed.  Rachel Donovan is a 78 year old female with a history of stroke, hypertension, hyperlipidemia, hypothyroidism, heart disease who presents with memory concerns. She is accompanied by Randine, her daughter in law and primary caregiver.  She experiences memory issues that are noticeable but not severe. She can remember people and faces but struggles with conversations, as evidenced by calling her daughter multiple times due to confusion about meeting arrangements. Her daughter in law notes frequent repetition of answers to her questions and occasional anxiety and panic about certain situations.  Due to concerns about her ability to manage medications independently, her daughter in law has taken over this responsibility. She used to organize her own medications but now sometimes questions their purpose. Her daughter in law also assists with household management, as she has been buying duplicate items and has expired food in her refrigerator. She has become more cluttered, a change from her previous neat habits, and spends excessive time studying her calendar, which seems to agitate her.  She reports no recent accidents or getting lost while driving and is confident in her navigation skills. However, she dislikes technology, finding it difficult to use her computer and struggling with accessing MyChart. Her daughter notes that she sometimes does not respond to texts  promptly and has issues with her phone going to voicemail.  Her sleep is generally good, although she sleeps on the sofa to be near her husband, who has seizures. No sleep apnea or snoring issues. She has a history of stroke but no traumatic brain injury or seizures. She reports anxiety but no depression.  There is no known family history of dementia, although her father committed suicide due to health problems, and her mother had mild memory issues in old age. Her daughter inlaw mentions that she has been sleeping more during the day, initially attributed to a muscle relaxer taken at midday, but this has been adjusted to nighttime dosing.  She has a history of back pain, sciatica, and trigger finger, for which she has seen a sports medicine doctor. She has undergone physical therapy but found it unchallenging and has canceled her appointments. She has had two artificial knees and has previously engaged in more rigorous rehabilitation.    TBI:  No past history of TBI Stroke: Yes Seizures: no past history of seizures Sleep:  no history of sleep apnea.  Mood: Yes, anxiety and on medication Family history of Dementia: Denies  Functional status: independent in all ADLs and IADLs Patient lives with her husband, her son and daughter-in-law checks on her often. Cooking: No Cleaning: Some Shopping: Yes Bathing: No issue noted Toileting: No issue noted Driving: No issues noted Bills: Husband takes care of them. Medications: Daughter-in-law help with her medication Ever left the stove on by accident?:  Not applicable Forget how to use items around the house?: Difficulty with electronics, trouble getting into Mychart  Getting lost going to familiar places?: Denies Forgetting loved ones  names?: Denies  Word finding difficulty? Denies  Sleep: Good    OTHER MEDICAL CONDITIONS: Hypertension, hyperlipidemia, heart disease, previous stroke, hypothyroidism, anxiety depression   REVIEW OF SYSTEMS:  Full 14 system review of systems performed and negative with exception of: As noted in the HPI  ALLERGIES: Allergies[1]  HOME MEDICATIONS: Outpatient Medications Prior to Visit  Medication Sig Dispense Refill   amLODipine  (NORVASC ) 10 MG tablet Take 1 tablet (10 mg total) by mouth daily. 90 tablet 1   buPROPion  (WELLBUTRIN  XL) 150 MG 24 hr tablet Take 150 mg by mouth daily.     Calcium  Carbonate-Vit D-Min (CALCIUM  1200 PO) Take 1 tablet by mouth 2 (two) times daily. Takes Citracal     dipyridamole -aspirin  (AGGRENOX ) 200-25 MG 12hr capsule TAKE 1 CAPSULE BY MOUTH TWICE DAILY 180 capsule 1   ibandronate  (BONIVA ) 150 MG tablet Take 1 tablet (150 mg total) by mouth every 30 (thirty) days. Take in the morning with a full glass of water, on an empty stomach, and do not take anything else by mouth or lie down for the next 30 min. 3 tablet 3   levothyroxine  (SYNTHROID ) 112 MCG tablet Take 1 tablet (112 mcg total) by mouth daily. 90 tablet 3   LORazepam  (ATIVAN ) 1 MG tablet TAKE 1 TABLET(1 MG) BY MOUTH DAILY AS NEEDED FOR ANXIETY 30 tablet 0   losartan  (COZAAR ) 100 MG tablet TAKE 1 TABLET(100 MG) BY MOUTH DAILY 90 tablet 2   methocarbamol  (ROBAXIN ) 500 MG tablet Take 1 tablet (500 mg total) by mouth every 8 (eight) hours as needed for muscle spasms. 40 tablet 1   metoprolol  succinate (TOPROL -XL) 50 MG 24 hr tablet TAKE 1 TABLET(50 MG) BY MOUTH DAILY 90 tablet 1   Multiple Vitamin (GNP ESSENTIAL ONE DAILY) TABS Take by mouth daily.      Multiple Vitamins-Minerals (PRESERVISION AREDS 2 PO) Take by mouth 2 (two) times daily.     rosuvastatin  (CRESTOR ) 20 MG tablet Take 1 tablet (20 mg total) by mouth daily. 90 tablet 1   sertraline  (ZOLOFT ) 50 MG tablet Take 1 tablet (50 mg total) by mouth daily. 90 tablet 0   busPIRone  (BUSPAR ) 15 MG tablet TAKE 1 TABLET(15 MG) BY MOUTH TWICE DAILY (Patient not taking: Reported on 01/26/2024) 180 tablet 1   Folic Acid-Vit B6-Vit B12 (FABB) 2.2-25-1 MG TABS Take 1 tablet  by mouth daily. (Patient not taking: Reported on 01/26/2024) 90 tablet 2   Facility-Administered Medications Prior to Visit  Medication Dose Route Frequency Provider Last Rate Last Admin   0.9 %  sodium chloride  infusion  500 mL Intravenous Once Eda Iha, MD        PAST MEDICAL HISTORY: Past Medical History:  Diagnosis Date   Anxiety    Aortic atherosclerosis    Arthritis    Benign neoplasm of colon, unspecified    Colon polyp    CVA (cerebral vascular accident) (HCC) 2006   Depression    High cholesterol    Hyperlipidemia    Hyperplastic colon polyp    Hypertension    Hypothyroid    surgical   IGT (impaired glucose tolerance)    Melanoma (HCC)    Migraines    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    OA (osteoarthritis)    PVD (peripheral vascular disease)    Rectocele    Shingles     PAST SURGICAL HISTORY: Past Surgical History:  Procedure Laterality Date   ABDOMINAL HYSTERECTOMY     BREAST BIOPSY  Right    X 2   BREAST BIOPSY Left    BREAST SURGERY     BX breast lumps X 3   CATARACT EXTRACTION, BILATERAL     COLONOSCOPY     PARTIAL HYSTERECTOMY     REPLACEMENT TOTAL KNEE Bilateral    RIGHT WRIST TENDON     THYROIDECTOMY      FAMILY HISTORY: Family History  Problem Relation Age of Onset   Stroke Mother    Hypertension Mother    Heart disease Mother    Arthritis Mother    Kidney disease Mother    Hypertension Father    Heart disease Father    Arthritis Father    Hypertension Brother    Heart attack Brother    Heart disease Brother    Arthritis Brother    Thyroid  cancer Son     SOCIAL HISTORY: Social History   Socioeconomic History   Marital status: Married    Spouse name: Octaviano   Number of children: 4   Years of education: Not on file   Highest education level: GED or equivalent  Occupational History   Not on file  Tobacco Use   Smoking status: Former    Current packs/day: 0.00    Average packs/day: 0.5 packs/day  for 12.0 years (6.0 ttl pk-yrs)    Types: Cigarettes    Start date: 02/04/1966    Quit date: 02/04/1978    Years since quitting: 46.0   Smokeless tobacco: Never  Vaping Use   Vaping status: Never Used  Substance and Sexual Activity   Alcohol use: Yes    Comment: occassional beer with pizza, wine sometime   Drug use: No   Sexual activity: Not Currently    Partners: Male    Birth control/protection: Post-menopausal    Comment: MARRIED  Other Topics Concern   Not on file  Social History Narrative      1 bio and 3 bonus    Social Drivers of Health   Tobacco Use: Medium Risk (01/26/2024)   Patient History    Smoking Tobacco Use: Former    Smokeless Tobacco Use: Never    Passive Exposure: Not on Actuary Strain: Low Risk (10/11/2023)   Overall Financial Resource Strain (CARDIA)    Difficulty of Paying Living Expenses: Not hard at all  Food Insecurity: No Food Insecurity (10/11/2023)   Epic    Worried About Radiation Protection Practitioner of Food in the Last Year: Never true    Ran Out of Food in the Last Year: Never true  Transportation Needs: No Transportation Needs (10/11/2023)   Epic    Lack of Transportation (Medical): No    Lack of Transportation (Non-Medical): No  Physical Activity: Insufficiently Active (10/11/2023)   Exercise Vital Sign    Days of Exercise per Week: 4 days    Minutes of Exercise per Session: 20 min  Stress: Stress Concern Present (10/11/2023)   [employer] of Occupational Health - Occupational Stress Questionnaire    Feeling of Stress: Rather much  Social Connections: Socially Integrated (10/11/2023)   Social Connection and Isolation Panel    Frequency of Communication with Friends and Family: Twice a week    Frequency of Social Gatherings with Friends and Family: Twice a week    Attends Religious Services: More than 4 times per year    Active Member of [employer] or Organizations: Yes    Attends Banker Meetings: More than 4 times per year  Marital Status: Married  Catering Manager Violence: Not At Risk (04/30/2023)   Humiliation, Afraid, Rape, and Kick questionnaire    Fear of Current or Ex-Partner: No    Emotionally Abused: No    Physically Abused: No    Sexually Abused: No  Depression (PHQ2-9): Low Risk (01/13/2024)   Depression (PHQ2-9)    PHQ-2 Score: 0  Alcohol Screen: Low Risk (10/11/2023)   Alcohol Screen    Last Alcohol Screening Score (AUDIT): 2  Housing: Low Risk (10/11/2023)   Epic    Unable to Pay for Housing in the Last Year: No    Number of Times Moved in the Last Year: 0    Homeless in the Last Year: No  Utilities: Not At Risk (04/30/2023)   [facility] Utilities    Threatened with loss of utilities: No  Recent Concern: Utilities - At Risk (02/28/2023)   [facility] Utilities    Threatened with loss of utilities: Already shut off  Health Literacy: Adequate Health Literacy (04/30/2023)   B1300 Health Literacy    Frequency of need for help with medical instructions: Never    PHYSICAL EXAM  GENERAL EXAM/CONSTITUTIONAL: Vitals:  Vitals:   01/26/24 1309 01/26/24 1404  BP: (!) 161/78 (!) 142/82  Pulse:  64  SpO2: 98% 98%  Weight: 121 lb (54.9 kg)   Height: 5' 1 (1.549 m)    Body mass index is 22.86 kg/m. Wt Readings from Last 3 Encounters:  01/26/24 121 lb (54.9 kg)  01/13/24 119 lb (54 kg)  12/22/23 119 lb (54 kg)   Patient is in no distress; well developed, nourished and groomed; neck is supple  MUSCULOSKELETAL: Gait, strength, tone, movements noted in Neurologic exam below  NEUROLOGIC: MENTAL STATUS:     10/14/2023    9:59 AM 10/04/2022   12:55 PM  MMSE - Mini Mental State Exam  Orientation to time 3 2  Orientation to Place 5 4  Registration 3 3  Attention/ Calculation 5 5  Recall 3 2  Language- name 2 objects 2 2  Language- repeat 1 1  Language- follow 3 step command 3 3  Language- read & follow direction 1 1  Write a sentence 1 1  Copy design 0 1  Total score 27 25      01/26/2024    1:14  PM  Montreal Cognitive Assessment   Visuospatial/ Executive (0/5) 1  Naming (0/3) 3  Attention: Read list of digits (0/2) 2  Attention: Read list of letters (0/1) 1  Attention: Serial 7 subtraction starting at 100 (0/3) 3  Language: Repeat phrase (0/2) 2  Language : Fluency (0/1) 1  Abstraction (0/2) 2  Delayed Recall (0/5) 0  Orientation (0/6) 2  Total 17  Adjusted Score (based on education) 17   awake, alert, oriented to person, place and time recent and remote memory intact normal attention and concentration language fluent, comprehension intact, naming intact fund of knowledge appropriate  CRANIAL NERVE:  2nd, 3rd, 4th, 6th- visual fields full to confrontation, extraocular muscles intact, no nystagmus 5th - facial sensation symmetric 7th - facial strength symmetric 8th - hearing intact 9th - palate elevates symmetrically, uvula midline 11th - shoulder shrug symmetric 12th - tongue protrusion midline  MOTOR:  normal bulk and tone, full strength in the BUE, BLE  SENSORY:  normal and symmetric to light touch  GAIT/STATION:  normal    DIAGNOSTIC DATA (LABS, IMAGING, TESTING) - I reviewed patient records, labs, notes, testing and imaging myself where available.  Lab Results  Component Value Date   WBC 5.1 10/14/2023   HGB 14.3 10/14/2023   HCT 41.3 10/14/2023   MCV 94.1 10/14/2023   PLT 176.0 10/14/2023      Component Value Date/Time   NA 131 (L) 01/13/2024 1031   NA 134 03/19/2021 1038   K 4.3 01/13/2024 1031   CL 93 (L) 01/13/2024 1031   CO2 30 01/13/2024 1031   GLUCOSE 121 (H) 01/13/2024 1031   BUN 17 01/13/2024 1031   BUN 18 03/19/2021 1038   CREATININE 0.78 01/13/2024 1031   CREATININE 0.99 01/07/2022 1146   CALCIUM  9.6 01/13/2024 1031   PROT 6.6 10/14/2023 0945   ALBUMIN 4.5 10/14/2023 0945   AST 14 10/14/2023 0945   ALT 16 10/14/2023 0945   ALKPHOS 73 10/14/2023 0945   BILITOT 0.8 10/14/2023 0945   GFRNONAA 43 (L) 02/09/2023 2202   Lab  Results  Component Value Date   CHOL 137 10/14/2023   HDL 72.80 10/14/2023   LDLCALC 48 10/14/2023   TRIG 81.0 10/14/2023   CHOLHDL 2 10/14/2023   No results found for: HGBA1C Lab Results  Component Value Date   VITAMINB12 448 10/14/2023   Lab Results  Component Value Date   TSH 0.60 10/14/2023    CT Head 02/09/2023 1. No acute intracranial process. 2. Moderate chronic small vessel ischemic changes.    ASSESSMENT AND PLAN  78 y.o. year old female with medical history including hypertension, hyperlipidemia, hypothyroidism, heart disease, who is presenting with memory loss.  Mild cognitive impairment Memory issues affecting daily activities, including medication management and calendar use. MOCA score of 17 indicates mild cognitive impairment. Previous MMSE score was normal at 27. No significant impact on basic activities of daily living. Blood test for Alzheimer's biomarkers ordered to assess risk of progression to dementia. Positive biomarkers indicate higher risk but do not confirm dementia. Emphasized that forgetfulness affecting basic daily activities is key to dementia diagnosis. - Ordered blood test for Alzheimer's biomarkers - Will consider starting Aricept if biomarkers are positive - Updated contact information for primary contact     1. Mild cognitive impairment      Patient Instructions  Will obtain dementia lab including B12, TSH and ATN profile I will contact you to go over the results, and if ATN is positive, we will likely start Aricept Continue your other medications Continue follow-up with your doctors Return sooner if worse  There are well-accepted and sensible ways to reduce risk for Alzheimers disease and other degenerative brain disorders .  Exercise Daily Walk A daily 20 minute walk should be part of your routine. Disease related apathy can be a significant roadblock to exercise and the only way to overcome this is to make it a daily routine and  perhaps have a reward at the end (something your loved one loves to eat or drink perhaps) or a personal trainer coming to the home can also be very useful. Most importantly, the patient is much more likely to exercise if the caregiver / spouse does it with him/her. In general a structured, repetitive schedule is best.  General Health: Any diseases which effect your body will effect your brain such as a pneumonia, urinary infection, blood clot, heart attack or stroke. Keep contact with your primary care doctor for regular follow ups.  Sleep. A good nights sleep is healthy for the brain. Seven hours is recommended. If you have insomnia or poor sleep habits we can give you some instructions. If you  have sleep apnea wear your mask.  Diet: Eating a heart healthy diet is also a good idea; fish and poultry instead of red meat, nuts (mostly non-peanuts), vegetables, fruits, olive oil or canola oil (instead of butter), minimal salt (use other spices to flavor foods), whole grain rice, bread, cereal and pasta and wine in moderation.Research is now showing that the MIND diet, which is a combination of The Mediterranean diet and the DASH diet, is beneficial for cognitive processing and longevity. Information about this diet can be found in The MIND Diet, a book by Annitta Feeling, MS, RDN, and online at wildwildscience.es  Finances, Power of [address] and Advance Directives: You should consider putting legal safeguards in place with regard to financial and medical decision making. While the spouse always has power of attorney for medical and financial issues in the absence of any form, you should consider what you want in case the spouse / caregiver is no longer around or capable of making decisions.     Orders Placed This Encounter  Procedures   TSH   Vitamin B12   ATN PROFILE    No orders of the defined types were placed in this encounter.   Return if symptoms worsen or fail to  improve.  I personally spent a total of 65 minutes in the care of the patient today including preparing to see the patient, getting/reviewing separately obtained history, performing a medically appropriate exam/evaluation, counseling and educating, placing orders, documenting clinical information in the EHR, and discussed mild cognitive impairment vs. Dementia and the different types of dementia. We also discussed the biomarker for Alzheimer disease and what it means for patient if biomarkers are positive.   Pastor Falling, MD 01/26/2024, 2:55 PM  [facility] [address] [place], [place] [address] [phone]     [1]  Allergies Allergen Reactions   Codeine Hives   Shingrix [Zoster Vac Recomb Adjuvanted]    Sulfa Antibiotics Hives   Penicillins Hives, Other (See Comments) and Rash   "

## 2024-01-26 NOTE — Patient Instructions (Addendum)
 Will obtain dementia lab including B12, TSH and ATN profile I will contact you to go over the results, and if ATN is positive, we will likely start Aricept Continue your other medications Continue follow-up with your doctors Return sooner if worse  There are well-accepted and sensible ways to reduce risk for Alzheimers disease and other degenerative brain disorders .  Exercise Daily Walk A daily 20 minute walk should be part of your routine. Disease related apathy can be a significant roadblock to exercise and the only way to overcome this is to make it a daily routine and perhaps have a reward at the end (something your loved one loves to eat or drink perhaps) or a personal trainer coming to the home can also be very useful. Most importantly, the patient is much more likely to exercise if the caregiver / spouse does it with him/her. In general a structured, repetitive schedule is best.  General Health: Any diseases which effect your body will effect your brain such as a pneumonia, urinary infection, blood clot, heart attack or stroke. Keep contact with your primary care doctor for regular follow ups.  Sleep. A good nights sleep is healthy for the brain. Seven hours is recommended. If you have insomnia or poor sleep habits we can give you some instructions. If you have sleep apnea wear your mask.  Diet: Eating a heart healthy diet is also a good idea; fish and poultry instead of red meat, nuts (mostly non-peanuts), vegetables, fruits, olive oil or canola oil (instead of butter), minimal salt (use other spices to flavor foods), whole grain rice, bread, cereal and pasta and wine in moderation.Research is now showing that the MIND diet, which is a combination of The Mediterranean diet and the DASH diet, is beneficial for cognitive processing and longevity. Information about this diet can be found in The MIND Diet, a book by Annitta Feeling, MS, RDN, and online at  wildwildscience.es  Finances, Power of [address] and Advance Directives: You should consider putting legal safeguards in place with regard to financial and medical decision making. While the spouse always has power of attorney for medical and financial issues in the absence of any form, you should consider what you want in case the spouse / caregiver is no longer around or capable of making decisions.

## 2024-01-28 ENCOUNTER — Ambulatory Visit

## 2024-02-01 ENCOUNTER — Ambulatory Visit: Payer: Self-pay | Admitting: Neurology

## 2024-02-01 DIAGNOSIS — G309 Alzheimer's disease, unspecified: Secondary | ICD-10-CM

## 2024-02-01 LAB — ATN PROFILE
A -- Beta-amyloid 42/40 Ratio: 0.092 — AB
Beta-amyloid 40: 194.68 pg/mL
Beta-amyloid 42: 18 pg/mL
N -- NfL, Plasma: 16.7 pg/mL — AB (ref 0.00–6.04)
T -- p-tau181: 2.47 pg/mL — AB (ref 0.00–0.97)

## 2024-02-01 LAB — TSH: TSH: 8.65 u[IU]/mL — ABNORMAL HIGH (ref 0.450–4.500)

## 2024-02-01 LAB — VITAMIN B12: Vitamin B-12: 669 pg/mL (ref 232–1245)

## 2024-02-01 MED ORDER — DONEPEZIL HCL 5 MG PO TABS: 5.0000 mg | ORAL_TABLET | Freq: Every day | ORAL | 6 refills | Status: AC

## 2024-02-01 NOTE — Progress Notes (Signed)
 Please call and advise the patient that the recent labs we checked show presence of Alzheimer disease biomarker. The memory loss that you are experiencing is likely from Alzheimer disease. I will start you on a medication called Aricept  to take at night. Side effect include dizziness, diarrhea and vivid dreams. If you experience any side effect, please stop the medication and call us . The labs results also showed an abnormal thyroid  function. Please follow up with you PCP and have labs repeated. Please remind patient to keep any upcoming appointments or tests and to call us  with any interim questions, concerns, problems or updates. Thanks,   Pastor Falling, MD

## 2024-02-02 ENCOUNTER — Ambulatory Visit

## 2024-02-03 NOTE — Telephone Encounter (Signed)
 Spoke with the patients husband and daughter-in-law. The patient is currently prescribed Aricept . The daughter-in-law expressed several concerns and questions, including: (1) guidance on how the husband and family should communicate the diagnosis and related information to the patient; (2) clarification regarding the patients current stage of Alzheimers disease and anticipated disease progression; and (3) availability of supportive resources for the patient and family. Education, anticipatory guidance, and discussion of available resources were requested.

## 2024-02-04 ENCOUNTER — Ambulatory Visit

## 2024-02-06 NOTE — Progress Notes (Signed)
 Can we please the patient for a follow up and add her to the cancellation list.

## 2024-02-10 NOTE — Telephone Encounter (Signed)
 Phone rep called pt's daughter in law(on DPR) pt has been scheduled and is on wait list.  Pt's daughter in law said that on last week she asked questions to RN and is waiting on a response, she is now asking for a call to address speaking with pt about her diagnosis, please call.

## 2024-02-13 ENCOUNTER — Telehealth: Payer: Self-pay

## 2024-02-13 NOTE — Telephone Encounter (Signed)
 Called and spoke with Randine on patients dpr.  Randine is worried about patient being aware of diagnosis.  She is due to have lab work done.  Not sure if she will make it to the lab appointment but wants to make sure the diagnosis is not mentioned to patient at all.  Randine is very worried if patient finds out it will disrupt treatment and how she has been doing.   Lab: do not use the word alzheimer's   [name]: tracy would like thyroid  levels added on per Dr. Cammora

## 2024-02-13 NOTE — Telephone Encounter (Signed)
 Copied from CRM [id number]. Topic: General - Other >> Feb 12, 2024  4:18 PM Wess RAMAN wrote: Reason for CRM: Patient's daughter-in-law, Randine, would like to speak with Janae in regards to patient's diagnosis of Alzheimer's disease. She also does not want this communication on mychart, due to patient not being informed that she has Alzheimer's disease  Callback #: [phone]. Leaving a voicemail is fine if unable to get in contact.

## 2024-02-16 ENCOUNTER — Ambulatory Visit

## 2024-02-16 ENCOUNTER — Other Ambulatory Visit

## 2024-02-16 ENCOUNTER — Other Ambulatory Visit: Payer: Self-pay | Admitting: Nurse Practitioner

## 2024-02-16 DIAGNOSIS — E039 Hypothyroidism, unspecified: Secondary | ICD-10-CM

## 2024-02-16 DIAGNOSIS — E871 Hypo-osmolality and hyponatremia: Secondary | ICD-10-CM

## 2024-02-16 DIAGNOSIS — R7989 Other specified abnormal findings of blood chemistry: Secondary | ICD-10-CM | POA: Diagnosis not present

## 2024-02-16 LAB — TSH: TSH: 1.99 u[IU]/mL (ref 0.35–5.50)

## 2024-02-16 LAB — BASIC METABOLIC PANEL WITH GFR
BUN: 18 mg/dL (ref 6–23)
CO2: 28 meq/L (ref 19–32)
Calcium: 9.3 mg/dL (ref 8.4–10.5)
Chloride: 95 meq/L — ABNORMAL LOW (ref 96–112)
Creatinine, Ser: 0.92 mg/dL (ref 0.40–1.20)
GFR: 59.46 mL/min — ABNORMAL LOW
Glucose, Bld: 88 mg/dL (ref 70–99)
Potassium: 4 meq/L (ref 3.5–5.1)
Sodium: 131 meq/L — ABNORMAL LOW (ref 135–145)

## 2024-02-16 LAB — T4, FREE: Free T4: 1.42 ng/dL (ref 0.60–1.60)

## 2024-02-16 LAB — T3, FREE: T3, Free: 2.5 pg/mL (ref 2.3–4.2)

## 2024-02-16 NOTE — Progress Notes (Unsigned)
 Add on a free t3 and t4 with TSH and placed in future

## 2024-02-18 ENCOUNTER — Ambulatory Visit: Payer: Self-pay | Admitting: Nurse Practitioner

## 2024-02-19 ENCOUNTER — Ambulatory Visit

## 2024-02-20 ENCOUNTER — Ambulatory Visit: Payer: Self-pay | Admitting: Nurse Practitioner

## 2024-02-22 NOTE — Progress Notes (Unsigned)
 "    Arieana Somoza T. Elana Jian, MD, CAQ Sports Medicine [facility] [address] [place], [address]  Phone: [phone]  FAX: [phone]  Rachel Donovan - 79 y.o. female  MRN [id number]  Date of Birth: 21-Apr-1945  Date: 02/23/2024  PCP: Wendee Lynwood HERO, NP  Referral: Wendee Lynwood HERO, NP  No chief complaint on file.  Subjective:   SYNA GAD is a 79 y.o. very pleasant female patient with There is no height or weight on file to calculate BMI. who presents with the following:  Discussed the use of AI scribe software for clinical note transcription with the patient, who gave verbal consent to proceed.  Patient presents for chronic back pain follow-up.  I last saw her November 2025.  At that point I gave her some prednisone  and methocarbamol .  I had also had her see physical therapy. Chronic radiculopathy, degenerative disc disease, with radicular symptoms predominantly in the right side buttock and leg. - She also had some fairly mild trochanteric bursitis on the right side the last time I saw her, as well.  She was recently discharged from formal physical therapy. History of Present Illness     Review of Systems is noted in the HPI, as appropriate  Objective:   There were no vitals taken for this visit.  GEN: No acute distress; alert,appropriate. PULM: Breathing comfortably in no respiratory distress PSYCH: Normally interactive.   Laboratory and Imaging Data:  Assessment and Plan:     ICD-10-CM   1. Chronic back pain greater than 3 months duration  M54.9    G89.29     2. Bilateral radiating leg pain  M54.10     3. Trochanteric bursitis of both hips  M70.61    M70.62      Assessment & Plan   Medication Management during today's office visit: No orders of the defined types were placed in this encounter.  There are no discontinued medications.  Orders placed today for conditions managed today: No orders of the defined  types were placed in this encounter.   Disposition: No follow-ups on file.  [employer] speech-to-text software was used for transcription in this dictation.  Possible transcriptional errors can occur using Animal nutritionist.   Signed,  Jacques DASEN. June Rode, MD   Outpatient Encounter Medications as of 02/23/2024  Medication Sig   amLODipine  (NORVASC ) 10 MG tablet Take 1 tablet (10 mg total) by mouth daily.   buPROPion  (WELLBUTRIN  XL) 150 MG 24 hr tablet Take 150 mg by mouth daily.   busPIRone  (BUSPAR ) 15 MG tablet TAKE 1 TABLET(15 MG) BY MOUTH TWICE DAILY (Patient not taking: Reported on 01/26/2024)   Calcium  Carbonate-Vit D-Min (CALCIUM  1200 PO) Take 1 tablet by mouth 2 (two) times daily. Takes Citracal   dipyridamole -aspirin  (AGGRENOX ) 200-25 MG 12hr capsule TAKE 1 CAPSULE BY MOUTH TWICE DAILY   donepezil  (ARICEPT ) 5 MG tablet Take 1 tablet (5 mg total) by mouth at bedtime.   Folic Acid-Vit B6-Vit B12 (FABB) 2.2-25-1 MG TABS Take 1 tablet by mouth daily. (Patient not taking: Reported on 01/26/2024)   ibandronate  (BONIVA ) 150 MG tablet Take 1 tablet (150 mg total) by mouth every 30 (thirty) days. Take in the morning with a full glass of water, on an empty stomach, and do not take anything else by mouth or lie down for the next 30 min.   levothyroxine  (SYNTHROID ) 112 MCG tablet Take 1 tablet (112 mcg total) by mouth daily.  LORazepam  (ATIVAN ) 1 MG tablet TAKE 1 TABLET(1 MG) BY MOUTH DAILY AS NEEDED FOR ANXIETY   losartan  (COZAAR ) 100 MG tablet TAKE 1 TABLET(100 MG) BY MOUTH DAILY   methocarbamol  (ROBAXIN ) 500 MG tablet Take 1 tablet (500 mg total) by mouth every 8 (eight) hours as needed for muscle spasms.   metoprolol  succinate (TOPROL -XL) 50 MG 24 hr tablet TAKE 1 TABLET(50 MG) BY MOUTH DAILY   Multiple Vitamin (GNP ESSENTIAL ONE DAILY) TABS Take by mouth daily.    Multiple Vitamins-Minerals (PRESERVISION AREDS 2 PO) Take by mouth 2 (two) times daily.   rosuvastatin  (CRESTOR ) 20 MG  tablet Take 1 tablet (20 mg total) by mouth daily.   sertraline  (ZOLOFT ) 50 MG tablet Take 1 tablet (50 mg total) by mouth daily.   Facility-Administered Encounter Medications as of 02/23/2024  Medication   0.9 %  sodium chloride  infusion   "

## 2024-02-23 ENCOUNTER — Ambulatory Visit: Admitting: Nurse Practitioner

## 2024-02-23 ENCOUNTER — Ambulatory Visit: Admitting: Family Medicine

## 2024-02-23 ENCOUNTER — Encounter: Payer: Self-pay | Admitting: Family Medicine

## 2024-02-23 VITALS — BP 164/102 | HR 62 | Temp 98.0°F | Ht 61.0 in | Wt 121.5 lb

## 2024-02-23 DIAGNOSIS — G8929 Other chronic pain: Secondary | ICD-10-CM | POA: Diagnosis not present

## 2024-02-23 DIAGNOSIS — M7061 Trochanteric bursitis, right hip: Secondary | ICD-10-CM

## 2024-02-23 DIAGNOSIS — M7062 Trochanteric bursitis, left hip: Secondary | ICD-10-CM

## 2024-02-23 DIAGNOSIS — M5410 Radiculopathy, site unspecified: Secondary | ICD-10-CM

## 2024-02-23 DIAGNOSIS — M549 Dorsalgia, unspecified: Secondary | ICD-10-CM

## 2024-02-24 ENCOUNTER — Ambulatory Visit

## 2024-02-24 NOTE — Progress Notes (Signed)
 I spoke with the pt's daughter-in-law Randine (on [place]). Assured her pt is on wait list. We will notify if sooner availability with Dr Gregg. In the meantime would be happy to send out literature on Alz/Dementia. Randine was very adult nurse. We discussed that she can also go to [email or web address] where they even have a 24/7 hotline to call and I gave their the 800 number. Her questions were answered during the call. Discussed as caregivers they will need to be sure they take care of themselves as well to avoid caregiver burnout. I did tell her we do not encourage keeping secrets from the patient. For now Randine says she told the pt she has positive biomarkers and is on a medication to help her memory but they are trying to avoid using the word Alzheimer's. She said the pt & husband will stay at home for now instead of moving in with family. In future they will move into assisted living. She thanked me for the call.  Randine confirmed her address with me (on [place]). Dementia packet mailed to daughter-in-law who will share with pt's husband.

## 2024-02-26 ENCOUNTER — Ambulatory Visit

## 2024-03-01 ENCOUNTER — Institutional Professional Consult (permissible substitution): Admitting: Neurology

## 2024-03-02 ENCOUNTER — Ambulatory Visit

## 2024-03-04 ENCOUNTER — Ambulatory Visit

## 2024-03-08 ENCOUNTER — Ambulatory Visit: Payer: Medicare Other | Admitting: Neurology

## 2024-03-09 ENCOUNTER — Ambulatory Visit

## 2024-03-11 ENCOUNTER — Ambulatory Visit

## 2024-04-13 ENCOUNTER — Ambulatory Visit: Admitting: Nurse Practitioner

## 2024-04-30 ENCOUNTER — Ambulatory Visit

## 2024-09-06 ENCOUNTER — Ambulatory Visit: Admitting: Neurology
